# Patient Record
Sex: Female | Born: 1935 | Race: White | Hispanic: No | Smoking: Never smoker
Health system: Southern US, Community
[De-identification: ages and names within clinical notes are randomized; demographics above are authoritative.]

## PROBLEM LIST (undated history)

## (undated) DIAGNOSIS — E119 Type 2 diabetes mellitus without complications: Secondary | ICD-10-CM

## (undated) DIAGNOSIS — N183 Chronic kidney disease, stage 3 unspecified: Secondary | ICD-10-CM

## (undated) DIAGNOSIS — I10 Essential (primary) hypertension: Secondary | ICD-10-CM

## (undated) DIAGNOSIS — I4891 Unspecified atrial fibrillation: Secondary | ICD-10-CM

## (undated) DIAGNOSIS — E079 Disorder of thyroid, unspecified: Secondary | ICD-10-CM

## (undated) HISTORY — DX: Disorder of thyroid, unspecified: E07.9

## (undated) HISTORY — PX: CARDIAC CATHETERIZATION: SHX172

## (undated) HISTORY — DX: Chronic kidney disease, stage 3 unspecified: N18.30

## (undated) HISTORY — DX: Chronic kidney disease, stage 3 (moderate): N18.3

## (undated) HISTORY — PX: CORONARY ARTERY BYPASS GRAFT: SHX141

---

## 2016-04-23 ENCOUNTER — Emergency Department
Admission: EM | Admit: 2016-04-23 | Discharge: 2016-04-23 | Disposition: A | Discharge status: Home / Self Care | Payer: Medicare Other | Attending: Emergency Medicine | Admitting: Emergency Medicine

## 2016-04-23 ENCOUNTER — Emergency Department: Payer: Medicare Other

## 2016-04-23 ENCOUNTER — Encounter: Payer: Self-pay | Admitting: *Deleted

## 2016-04-23 DIAGNOSIS — I10 Essential (primary) hypertension: Secondary | ICD-10-CM | POA: Insufficient documentation

## 2016-04-23 DIAGNOSIS — W1800XA Striking against unspecified object with subsequent fall, initial encounter: Secondary | ICD-10-CM | POA: Diagnosis not present

## 2016-04-23 DIAGNOSIS — W19XXXA Unspecified fall, initial encounter: Secondary | ICD-10-CM

## 2016-04-23 DIAGNOSIS — N3000 Acute cystitis without hematuria: Secondary | ICD-10-CM | POA: Insufficient documentation

## 2016-04-23 DIAGNOSIS — E1165 Type 2 diabetes mellitus with hyperglycemia: Secondary | ICD-10-CM | POA: Insufficient documentation

## 2016-04-23 DIAGNOSIS — Z951 Presence of aortocoronary bypass graft: Secondary | ICD-10-CM | POA: Diagnosis not present

## 2016-04-23 DIAGNOSIS — Y939 Activity, unspecified: Secondary | ICD-10-CM | POA: Insufficient documentation

## 2016-04-23 DIAGNOSIS — Y999 Unspecified external cause status: Secondary | ICD-10-CM | POA: Insufficient documentation

## 2016-04-23 DIAGNOSIS — S0990XA Unspecified injury of head, initial encounter: Secondary | ICD-10-CM | POA: Diagnosis present

## 2016-04-23 DIAGNOSIS — Z79899 Other long term (current) drug therapy: Secondary | ICD-10-CM | POA: Insufficient documentation

## 2016-04-23 DIAGNOSIS — Z794 Long term (current) use of insulin: Secondary | ICD-10-CM | POA: Insufficient documentation

## 2016-04-23 DIAGNOSIS — Z7901 Long term (current) use of anticoagulants: Secondary | ICD-10-CM | POA: Insufficient documentation

## 2016-04-23 DIAGNOSIS — Z7982 Long term (current) use of aspirin: Secondary | ICD-10-CM | POA: Diagnosis not present

## 2016-04-23 DIAGNOSIS — Y929 Unspecified place or not applicable: Secondary | ICD-10-CM | POA: Insufficient documentation

## 2016-04-23 DIAGNOSIS — R739 Hyperglycemia, unspecified: Secondary | ICD-10-CM

## 2016-04-23 HISTORY — DX: Unspecified atrial fibrillation: I48.91

## 2016-04-23 HISTORY — DX: Type 2 diabetes mellitus without complications: E11.9

## 2016-04-23 HISTORY — DX: Essential (primary) hypertension: I10

## 2016-04-23 LAB — BLOOD GAS, VENOUS
ACID-BASE EXCESS: 7.6 mmol/L — AB (ref 0.0–2.0)
Bicarbonate: 33.7 mmol/L — ABNORMAL HIGH (ref 20.0–28.0)
O2 SAT: 66.9 %
PH VEN: 7.42 (ref 7.250–7.430)
Patient temperature: 37
pCO2, Ven: 52 mmHg (ref 44.0–60.0)
pO2, Ven: 34 mmHg (ref 32.0–45.0)

## 2016-04-23 LAB — BASIC METABOLIC PANEL
ANION GAP: 9 (ref 5–15)
BUN: 51 mg/dL — ABNORMAL HIGH (ref 6–20)
CO2: 30 mmol/L (ref 22–32)
Calcium: 9.1 mg/dL (ref 8.9–10.3)
Chloride: 89 mmol/L — ABNORMAL LOW (ref 101–111)
Creatinine, Ser: 1.83 mg/dL — ABNORMAL HIGH (ref 0.44–1.00)
GFR, EST AFRICAN AMERICAN: 27 mL/min — AB (ref >60)
GFR, EST NON AFRICAN AMERICAN: 24 mL/min — AB (ref >60)
Glucose, Bld: 506 mg/dL (ref 65–99)
POTASSIUM: 4.5 mmol/L (ref 3.5–5.1)
SODIUM: 128 mmol/L — AB (ref 135–145)

## 2016-04-23 LAB — URINALYSIS COMPLETE WITH MICROSCOPIC (ARMC ONLY)
Bilirubin Urine: NEGATIVE
HGB URINE DIPSTICK: NEGATIVE
Ketones, ur: NEGATIVE mg/dL
NITRITE: NEGATIVE
Protein, ur: NEGATIVE mg/dL
SPECIFIC GRAVITY, URINE: 1.006 (ref 1.005–1.030)
Trans Epithel, UA: <1
pH: 7 (ref 5.0–8.0)

## 2016-04-23 LAB — CBC
HEMATOCRIT: 39.9 % (ref 35.0–47.0)
HEMOGLOBIN: 13.2 g/dL (ref 12.0–16.0)
MCH: 30.6 pg (ref 26.0–34.0)
MCHC: 33.2 g/dL (ref 32.0–36.0)
MCV: 92.2 fL (ref 80.0–100.0)
Platelets: 155 10*3/uL (ref 150–440)
RBC: 4.32 MIL/uL (ref 3.80–5.20)
RDW: 15 % — AB (ref 11.5–14.5)
WBC: 10.8 10*3/uL (ref 3.6–11.0)

## 2016-04-23 LAB — GLUCOSE, CAPILLARY
GLUCOSE-CAPILLARY: 541 mg/dL — AB (ref 65–99)
GLUCOSE-CAPILLARY: 361 mg/dL — AB (ref 65–99)
Glucose-Capillary: 313 mg/dL — ABNORMAL HIGH (ref 65–99)
Glucose-Capillary: 333 mg/dL — ABNORMAL HIGH (ref 65–99)

## 2016-04-23 LAB — PROTIME-INR
INR: 2.46
Prothrombin Time: 27.1 seconds — ABNORMAL HIGH (ref 11.4–15.2)

## 2016-04-23 MED ORDER — INSULIN ASPART 100 UNIT/ML ~~LOC~~ SOLN
8.0000 [IU] | Freq: Once | SUBCUTANEOUS | Status: DC
  Filled 2016-04-23: qty 8

## 2016-04-23 MED ORDER — CEFTRIAXONE SODIUM-DEXTROSE 1-3.74 GM-% IV SOLR
INTRAVENOUS | Status: AC
  Filled 2016-04-23: qty 50

## 2016-04-23 MED ORDER — SODIUM CHLORIDE 0.9 % IV BOLUS (SEPSIS)
1000.0000 mL | Freq: Once | INTRAVENOUS | Status: AC
  Administered 2016-04-23: 1000 mL via INTRAVENOUS

## 2016-04-23 MED ORDER — CEPHALEXIN 500 MG PO CAPS: 500.0000 mg | ORAL_CAPSULE | Freq: Three times a day (TID) | ORAL | 0 refills | Status: DC

## 2016-04-23 MED ORDER — DEXTROSE 5 % IV SOLN
1.0000 g | Freq: Once | INTRAVENOUS | Status: AC
  Administered 2016-04-23: 1 g via INTRAVENOUS
  Filled 2016-04-23: qty 10

## 2016-04-23 MED ORDER — INSULIN ASPART 100 UNIT/ML ~~LOC~~ SOLN
4.0000 [IU] | Freq: Once | SUBCUTANEOUS | Status: AC
  Administered 2016-04-23: 4 [IU] via INTRAVENOUS

## 2016-04-23 MED ORDER — CEPHALEXIN 250 MG PO CAPS: 250.0000 mg | ORAL_CAPSULE | Freq: Two times a day (BID) | ORAL | 0 refills | Status: AC

## 2016-04-23 NOTE — ED Notes (Signed)
Pt to CT

## 2016-04-23 NOTE — ED Notes (Signed)
Reported patients CBG to MD Paduchowski. MD ordered to decrease insulin dose to 4 units.

## 2016-04-23 NOTE — ED Notes (Signed)
Dr. Lenard LancePaduchowski notified of blood sugar.

## 2016-04-23 NOTE — ED Provider Notes (Signed)
[facility] Emergency Department Provider Note  Time seen: 12:49 PM  I have reviewed the triage vital signs and the nursing notes.   HISTORY  Chief Complaint Hyperglycemia    HPI [name] [name] is a 82 y.o. female with a past medical history of diabetes, hypertension, atrial fibrillation who presents to the emergency department for elevated blood sugars. According to the daughter for the past 1 week of patient's blood sugars have been running high in the 300s. Today they were too high to read so she brought her to the emergency department for evaluation. Patient denies any abdominal pain, chest pain, nausea, vomiting. States she was having diarrhea earlier this week but it has resolved. Denies dysuria. Patient did have a fall today hitting her head. Patient does take Coumadin at baseline for atrial fibrillation.  Past Medical History:  Diagnosis Date  . A-fib (HCC)   . Diabetes mellitus without complication (HCC)   . Hypertension     There are no active problems to display for this patient.   Past Surgical History:  Procedure Laterality Date  . CORONARY ARTERY BYPASS GRAFT      Prior to Admission medications   Not on File    No Known Allergies  History reviewed. No pertinent family history.  Social History Social History  Substance Use Topics  . Smoking status: Never Smoker  . Smokeless tobacco: Never Used  . Alcohol use No    Review of Systems Constitutional: Negative for fever Cardiovascular: Negative for chest pain. Respiratory: Negative for shortness of breath. Gastrointestinal: Negative for abdominal pain, vomiting  Genitourinary: Negative for dysuria Neurological: Negative for headache 10-point ROS otherwise negative.  ____________________________________________   PHYSICAL EXAM:  VITAL SIGNS: ED Triage Vitals  Enc Vitals Group     BP 04/23/16 1207 122/84     Pulse Rate 04/23/16 1207 76     Resp 04/23/16 1207 16   Temp 04/23/16 1207 98.3 F (36.8 C)     Temp Source 04/23/16 1207 Oral     SpO2 04/23/16 1207 96 %     Weight 04/23/16 1208 180 lb (81.6 kg)     Height 04/23/16 1208 5\' 3"  (1.6 m)     Head Circumference --      Peak Flow --      Pain Score --      Pain Loc --      Pain Edu? --      Excl. in GC? --     Constitutional: Alert and oriented. Well appearing and in no distress. Eyes: Normal exam ENT   Head: Normocephalic and atraumatic.   Mouth/Throat: Mucous membranes are moist. Cardiovascular: Normal rate, regular rhythm. Respiratory: Normal respiratory effort without tachypnea nor retractions. Breath sounds are clear  Gastrointestinal: Soft and nontender. No distention.   Musculoskeletal: Nontender with normal range of motion in all extremities. Neurologic:  Normal speech and language. No gross focal neurologic deficits Skin:  Skin is warm, dry and intact.  Psychiatric: Tearful at times. Daughter states the patient has been very emotional since [place] one month ago.  ____________________________________________     RADIOLOGY  CT head negative  ____________________________________________   INITIAL IMPRESSION / ASSESSMENT AND PLAN / ED COURSE  Pertinent labs & imaging results that were available during my care of the patient were reviewed by me and considered in my medical decision making (see chart for details).  The patient presents the emergency department with an elevated blood sugar. Fingerstick is 541.  We will check labs, IV hydrate and closely monitor in the emergency department. Overall the patient appears well, no distress, overall normal physical examination.  CT head is negative. Patient's labs consistent with urinary tract infection. Patient given 1 g of Rocephin in the emergency department. This is likely contributed to the patient's hyperglycemia. It is now down to 300s unless fingerstick. We will discharge the patient home with PCP  follow-up in 10 days of Keflex. Patient agreeable to plan.  ____________________________________________   FINAL CLINICAL IMPRESSION(S) / ED DIAGNOSES  Hyperglycemia    Minna AntisKevin Verdie Barrows, MD 04/23/16 1751

## 2016-04-23 NOTE — Discharge Instructions (Signed)
Please drink plenty of non-sugary fluids over the next several days. Please take your antibiotics as prescribed for their entire course for urinary tract infection. Please follow-up with your primary care doctor in the next several days for recheck of your kidney function as well as your Coumadin level. Return to the emergency department for any significantly elevated blood glucose levels, or any other symptom personally concerning to your self.

## 2016-04-23 NOTE — ED Notes (Addendum)
Water given. Pt up to the bathroom with minimal assistance. Did not obtain urine specimen as family member helped patient up to the bathroom and did not have a cup or hat to catch it in

## 2016-04-23 NOTE — ED Notes (Signed)
Reviewed d/c instructions, follow-up care and prescription with pt. Pt verbalized understanding 

## 2016-04-23 NOTE — ED Notes (Signed)
Pt c/o leg cramps. MD Paduchowski informed

## 2016-04-23 NOTE — ED Notes (Signed)
MD Paduchowski informed of pt's CBG

## 2016-04-23 NOTE — ED Notes (Signed)
RT notified of VBG in room.

## 2016-04-23 NOTE — ED Notes (Signed)
MD Paduchowski informed of patient's CBG of 333

## 2016-04-23 NOTE — ED Notes (Signed)
PT returned from CT

## 2016-04-23 NOTE — ED Triage Notes (Signed)
Daughter states CBG have been in the 300s for about a week, states she recently moved her and thought it was from the stress but CBG read "high" this AM, pt takes 30 units of lantus BID and glipizde, states she has not missed a dose, states she has felt very tired recently, denies any recent sickness, pt awake and alert in no acute distress, pt had a fall this AM and hit her head,pt on coumadin for hx of afib

## 2016-04-24 LAB — URINE CULTURE

## 2016-05-04 ENCOUNTER — Emergency Department: Payer: Medicare Other

## 2016-05-04 ENCOUNTER — Inpatient Hospital Stay
Admission: EM | Admit: 2016-05-04 | Discharge: 2016-05-06 | DRG: 637 | Disposition: A | Discharge status: Home / Self Care | Payer: Medicare Other | Attending: Internal Medicine | Admitting: Internal Medicine

## 2016-05-04 ENCOUNTER — Encounter: Payer: Self-pay | Admitting: Emergency Medicine

## 2016-05-04 DIAGNOSIS — Z79899 Other long term (current) drug therapy: Secondary | ICD-10-CM | POA: Diagnosis not present

## 2016-05-04 DIAGNOSIS — I129 Hypertensive chronic kidney disease with stage 1 through stage 4 chronic kidney disease, or unspecified chronic kidney disease: Secondary | ICD-10-CM | POA: Diagnosis present

## 2016-05-04 DIAGNOSIS — I482 Chronic atrial fibrillation: Secondary | ICD-10-CM | POA: Diagnosis present

## 2016-05-04 DIAGNOSIS — N183 Chronic kidney disease, stage 3 (moderate): Secondary | ICD-10-CM | POA: Diagnosis present

## 2016-05-04 DIAGNOSIS — Z7982 Long term (current) use of aspirin: Secondary | ICD-10-CM

## 2016-05-04 DIAGNOSIS — G9340 Encephalopathy, unspecified: Secondary | ICD-10-CM | POA: Diagnosis present

## 2016-05-04 DIAGNOSIS — Z794 Long term (current) use of insulin: Secondary | ICD-10-CM

## 2016-05-04 DIAGNOSIS — Z951 Presence of aortocoronary bypass graft: Secondary | ICD-10-CM | POA: Diagnosis not present

## 2016-05-04 DIAGNOSIS — E871 Hypo-osmolality and hyponatremia: Secondary | ICD-10-CM | POA: Diagnosis present

## 2016-05-04 DIAGNOSIS — IMO0002 Reserved for concepts with insufficient information to code with codable children: Secondary | ICD-10-CM | POA: Diagnosis present

## 2016-05-04 DIAGNOSIS — Z66 Do not resuscitate: Secondary | ICD-10-CM | POA: Diagnosis present

## 2016-05-04 DIAGNOSIS — E8770 Fluid overload, unspecified: Secondary | ICD-10-CM | POA: Diagnosis present

## 2016-05-04 DIAGNOSIS — N179 Acute kidney failure, unspecified: Secondary | ICD-10-CM | POA: Diagnosis present

## 2016-05-04 DIAGNOSIS — R2681 Unsteadiness on feet: Secondary | ICD-10-CM

## 2016-05-04 DIAGNOSIS — E860 Dehydration: Secondary | ICD-10-CM | POA: Diagnosis present

## 2016-05-04 DIAGNOSIS — W19XXXA Unspecified fall, initial encounter: Secondary | ICD-10-CM | POA: Diagnosis present

## 2016-05-04 DIAGNOSIS — Z7901 Long term (current) use of anticoagulants: Secondary | ICD-10-CM | POA: Diagnosis not present

## 2016-05-04 DIAGNOSIS — Z6831 Body mass index (BMI) 31.0-31.9, adult: Secondary | ICD-10-CM

## 2016-05-04 DIAGNOSIS — E1165 Type 2 diabetes mellitus with hyperglycemia: Secondary | ICD-10-CM | POA: Diagnosis present

## 2016-05-04 DIAGNOSIS — E1122 Type 2 diabetes mellitus with diabetic chronic kidney disease: Secondary | ICD-10-CM | POA: Diagnosis present

## 2016-05-04 LAB — CBC
HEMATOCRIT: 35.1 % (ref 35.0–47.0)
HEMATOCRIT: 34.8 % — AB (ref 35.0–47.0)
Hemoglobin: 11.8 g/dL — ABNORMAL LOW (ref 12.0–16.0)
Hemoglobin: 11.8 g/dL — ABNORMAL LOW (ref 12.0–16.0)
MCH: 31 pg (ref 26.0–34.0)
MCH: 31.1 pg (ref 26.0–34.0)
MCHC: 33.8 g/dL (ref 32.0–36.0)
MCHC: 34 g/dL (ref 32.0–36.0)
MCV: 91.9 fL (ref 80.0–100.0)
MCV: 91.6 fL (ref 80.0–100.0)
PLATELETS: 188 10*3/uL (ref 150–440)
Platelets: 166 10*3/uL (ref 150–440)
RBC: 3.82 MIL/uL (ref 3.80–5.20)
RBC: 3.8 MIL/uL (ref 3.80–5.20)
RDW: 16.1 % — AB (ref 11.5–14.5)
RDW: 15.8 % — AB (ref 11.5–14.5)
WBC: 8.1 10*3/uL (ref 3.6–11.0)
WBC: 7.6 10*3/uL (ref 3.6–11.0)

## 2016-05-04 LAB — URINALYSIS COMPLETE WITH MICROSCOPIC (ARMC ONLY)
BACTERIA UA: NONE SEEN
BILIRUBIN URINE: NEGATIVE
Glucose, UA: >500 mg/dL — AB
HGB URINE DIPSTICK: NEGATIVE
Ketones, ur: NEGATIVE mg/dL
LEUKOCYTES UA: NEGATIVE
Nitrite: NEGATIVE
PH: 5 (ref 5.0–8.0)
Protein, ur: NEGATIVE mg/dL
RBC / HPF: NONE SEEN RBC/hpf (ref 0–5)
Specific Gravity, Urine: 1.003 — ABNORMAL LOW (ref 1.005–1.030)

## 2016-05-04 LAB — BRAIN NATRIURETIC PEPTIDE: B Natriuretic Peptide: 286 pg/mL — ABNORMAL HIGH (ref 0.0–100.0)

## 2016-05-04 LAB — BASIC METABOLIC PANEL
Anion gap: 11 (ref 5–15)
BUN: 53 mg/dL — AB (ref 6–20)
CHLORIDE: 85 mmol/L — AB (ref 101–111)
CO2: 26 mmol/L (ref 22–32)
CREATININE: 2.02 mg/dL — AB (ref 0.44–1.00)
Calcium: 8.6 mg/dL — ABNORMAL LOW (ref 8.9–10.3)
GFR calc Af Amer: 24 mL/min — ABNORMAL LOW (ref >60)
GFR calc non Af Amer: 21 mL/min — ABNORMAL LOW (ref >60)
Glucose, Bld: 423 mg/dL — ABNORMAL HIGH (ref 65–99)
POTASSIUM: 4.4 mmol/L (ref 3.5–5.1)
Sodium: 122 mmol/L — ABNORMAL LOW (ref 135–145)

## 2016-05-04 LAB — GLUCOSE, CAPILLARY
GLUCOSE-CAPILLARY: 325 mg/dL — AB (ref 65–99)
GLUCOSE-CAPILLARY: 238 mg/dL — AB (ref 65–99)
Glucose-Capillary: 263 mg/dL — ABNORMAL HIGH (ref 65–99)

## 2016-05-04 LAB — HEPATIC FUNCTION PANEL
ALK PHOS: 114 U/L (ref 38–126)
ALT: 22 U/L (ref 14–54)
AST: 31 U/L (ref 15–41)
Albumin: 3.3 g/dL — ABNORMAL LOW (ref 3.5–5.0)
BILIRUBIN DIRECT: 0.2 mg/dL (ref 0.1–0.5)
BILIRUBIN INDIRECT: 0.7 mg/dL (ref 0.3–0.9)
BILIRUBIN TOTAL: 0.9 mg/dL (ref 0.3–1.2)
TOTAL PROTEIN: 7.2 g/dL (ref 6.5–8.1)

## 2016-05-04 LAB — PROTIME-INR
INR: 3.16
PROTHROMBIN TIME: 33.1 s — AB (ref 11.4–15.2)

## 2016-05-04 LAB — MRSA PCR SCREENING: MRSA BY PCR: NEGATIVE

## 2016-05-04 LAB — LACTIC ACID, PLASMA: LACTIC ACID, VENOUS: 1.8 mmol/L (ref 0.5–1.9)

## 2016-05-04 LAB — TROPONIN I: Troponin I: 0.03 ng/mL (ref <0.03)

## 2016-05-04 MED ORDER — OCUVITE-LUTEIN PO CAPS
1.0000 | ORAL_CAPSULE | Freq: Every day | ORAL | Status: DC
  Administered 2016-05-04 – 2016-05-06 (×3): 1 via ORAL
  Filled 2016-05-04 (×3): qty 1

## 2016-05-04 MED ORDER — HEPARIN SODIUM (PORCINE) 5000 UNIT/ML IJ SOLN
5000.0000 [IU] | Freq: Three times a day (TID) | INTRAMUSCULAR | Status: DC
Start: 2016-05-04 — End: 2016-05-04

## 2016-05-04 MED ORDER — TRAMADOL HCL 50 MG PO TABS: 50.0000 mg | ORAL_TABLET | Freq: Four times a day (QID) | ORAL | Status: DC | PRN

## 2016-05-04 MED ORDER — GLIPIZIDE 10 MG PO TABS: 5.0000 mg | ORAL_TABLET | Freq: Every day | ORAL | Status: DC

## 2016-05-04 MED ORDER — SODIUM CHLORIDE 0.9 % IV SOLN
Freq: Once | INTRAVENOUS | Status: AC
  Administered 2016-05-04: 18:00:00 via INTRAVENOUS

## 2016-05-04 MED ORDER — ONDANSETRON HCL 4 MG PO TABS: 4.0000 mg | ORAL_TABLET | Freq: Four times a day (QID) | ORAL | Status: DC | PRN

## 2016-05-04 MED ORDER — ONDANSETRON HCL 4 MG/2ML IJ SOLN: 4.0000 mg | Freq: Four times a day (QID) | INTRAMUSCULAR | Status: DC | PRN

## 2016-05-04 MED ORDER — ACETAMINOPHEN 650 MG RE SUPP: 650.0000 mg | Freq: Four times a day (QID) | RECTAL | Status: DC | PRN

## 2016-05-04 MED ORDER — ASPIRIN EC 81 MG PO TBEC
81.0000 mg | DELAYED_RELEASE_TABLET | Freq: Every day | ORAL | Status: DC
  Administered 2016-05-05 – 2016-05-06 (×2): 81 mg via ORAL
  Filled 2016-05-04 (×2): qty 1

## 2016-05-04 MED ORDER — BUMETANIDE 1 MG PO TABS
1.0000 mg | ORAL_TABLET | Freq: Every day | ORAL | Status: DC
  Filled 2016-05-04: qty 1

## 2016-05-04 MED ORDER — WARFARIN SODIUM 2.5 MG PO TABS: 2.5000 mg | ORAL_TABLET | Freq: Every day | ORAL | Status: DC

## 2016-05-04 MED ORDER — INSULIN ASPART 100 UNIT/ML ~~LOC~~ SOLN: 8.0000 [IU] | Freq: Three times a day (TID) | SUBCUTANEOUS | Status: DC

## 2016-05-04 MED ORDER — ADULT MULTIVITAMIN W/MINERALS CH
1.0000 | ORAL_TABLET | Freq: Every day | ORAL | Status: DC
  Administered 2016-05-04 – 2016-05-06 (×3): 1 via ORAL
  Filled 2016-05-04 (×3): qty 1

## 2016-05-04 MED ORDER — ACETAMINOPHEN 325 MG PO TABS: 650.0000 mg | ORAL_TABLET | Freq: Four times a day (QID) | ORAL | Status: DC | PRN

## 2016-05-04 MED ORDER — INSULIN ASPART 100 UNIT/ML ~~LOC~~ SOLN
5.0000 [IU] | Freq: Once | SUBCUTANEOUS | Status: AC
  Administered 2016-05-04: 5 [IU] via SUBCUTANEOUS
  Filled 2016-05-04: qty 5

## 2016-05-04 MED ORDER — AMLODIPINE BESYLATE 5 MG PO TABS
5.0000 mg | ORAL_TABLET | Freq: Every day | ORAL | Status: DC
  Administered 2016-05-05: 5 mg via ORAL
  Filled 2016-05-04: qty 1

## 2016-05-04 MED ORDER — POTASSIUM CHLORIDE CRYS ER 20 MEQ PO TBCR
200.0000 meq | EXTENDED_RELEASE_TABLET | ORAL | Status: DC
  Filled 2016-05-04: qty 10

## 2016-05-04 MED ORDER — SODIUM CHLORIDE 0.9 % IV BOLUS (SEPSIS): 1000.0000 mL | Freq: Once | INTRAVENOUS | Status: DC

## 2016-05-04 MED ORDER — VITAMIN B-12 1000 MCG PO TABS
2500.0000 ug | ORAL_TABLET | Freq: Every day | ORAL | Status: DC
  Administered 2016-05-05 – 2016-05-06 (×2): 2500 ug via ORAL
  Filled 2016-05-04 (×2): qty 3

## 2016-05-04 MED ORDER — SODIUM CHLORIDE 0.9 % IV BOLUS (SEPSIS)
500.0000 mL | Freq: Once | INTRAVENOUS | Status: AC
  Administered 2016-05-04: 500 mL via INTRAVENOUS

## 2016-05-04 MED ORDER — INSULIN GLARGINE 100 UNIT/ML ~~LOC~~ SOLN
30.0000 [IU] | Freq: Two times a day (BID) | SUBCUTANEOUS | Status: DC
  Administered 2016-05-04: 30 [IU] via SUBCUTANEOUS
  Filled 2016-05-04 (×3): qty 0.3

## 2016-05-04 MED ORDER — SODIUM CHLORIDE 0.9 % IV SOLN
Freq: Once | INTRAVENOUS | Status: AC
  Administered 2016-05-04: 22:00:00 via INTRAVENOUS

## 2016-05-04 MED ORDER — POTASSIUM CHLORIDE CRYS ER 20 MEQ PO TBCR
20.0000 meq | EXTENDED_RELEASE_TABLET | ORAL | Status: DC
  Administered 2016-05-04 – 2016-05-06 (×2): 20 meq via ORAL
  Filled 2016-05-04 (×2): qty 1

## 2016-05-04 NOTE — ED Notes (Signed)
Ambulate pt and O2 stats were at 90%.

## 2016-05-04 NOTE — Progress Notes (Addendum)
ANTICOAGULATION CONSULT NOTE - Initial Consult  Pharmacy Consult for Warfarin  Indication: atrial fibrillation  Allergies  Allergen Reactions  . Pineapple Hives    Patient Measurements: Height: 5' 2.5" (158.8 cm) Weight: 187 lb 14.4 oz (85.2 kg) IBW/kg (Calculated) : 51.25 Heparin Dosing Weight:   Vital Signs: Temp: 98.5 F (36.9 C) (11/14 2057) Temp Source: Oral (11/14 2057) BP: 147/57 (11/14 2057) Pulse Rate: 119 (11/14 2057)  Labs:  Recent Labs  05/04/16 1310 05/04/16 1534  HGB 11.8*  --   HCT 35.1  --   PLT 188  --   LABPROT  --  33.1*  INR  --  3.16  CREATININE 2.02*  --   TROPONINI  --  0.03*    Estimated Creatinine Clearance: 19.7 mL/min (by C-G formula based on SCr of 2.02 mg/dL (H)).   Medical History: Past Medical History:  Diagnosis Date  . A-fib (HCC)   . Diabetes mellitus without complication (HCC)   . Hypertension     Medications:  Prescriptions Prior to Admission  Medication Sig Dispense Refill Last Dose  . amLODipine (NORVASC) 5 MG tablet Take 5 mg by mouth daily.   05/04/2016 at 0900  . aspirin 81 MG EC tablet Take 1 tablet by mouth daily.  5 05/04/2016 at 0900  . bumetanide (BUMEX) 1 MG tablet Take 1 tablet by mouth daily.   1 05/04/2016 at 0900  . Cyanocobalamin 2500 MCG CHEW Take 1 tablet by mouth daily.   05/04/2016 at 0900  . glipiZIDE (GLUCOTROL) 5 MG tablet Take 5 mg by mouth daily.  3 05/04/2016 at 0900  . LANTUS SOLOSTAR 100 UNIT/ML Solostar Pen Inject 30 Units into the skin 2 (two) times daily.  0 05/04/2016 at 0900  . losartan-hydrochlorothiazide (HYZAAR) 100-12.5 MG tablet Take 1 tablet by mouth daily.  5 05/04/2016 at 0900  . metoprolol (LOPRESSOR) 50 MG tablet Take 1 tablet by mouth 2 (two) times daily.  5 05/04/2016 at 0900  . Multiple Vitamins-Minerals (OCUVITE EYE + MULTI) TABS Take 1 tablet by mouth daily.   05/03/2016 at pm  . multivitamin (ONE-A-DAY MEN'S) TABS tablet Take 1 tablet by mouth daily.   05/03/2016 at pm  .  potassium chloride SA (K-DUR,KLOR-CON) 20 MEQ tablet Take 10 tablets by mouth every other day.    05/04/2016 at 0900  . warfarin (COUMADIN) 2.5 MG tablet Take 2.5 mg by mouth daily.  0 05/04/2016 at 0900  . cephALEXin (KEFLEX) 500 MG capsule Take 1 capsule (500 mg total) by mouth 3 (three) times daily. (Patient not taking: Reported on 05/04/2016) 30 capsule 0 Completed Course at Unknown time    Assessment: Pharmacy consulted to dose warfarin in this 82 year old female admitted with Afib.  Pt was on warfarin 2.5 mg PO daily at home.  11/14 :  INR = 3.16 11/15 AM INR 3.29.  Goal of Therapy:  INR 2-3   Plan:  Will hold warfarin dose and recheck INR on 11/15 with AM labs.   11/15 AM: Hold dose today and recheck INR with 11/16 AM labs.   Robbins,Jason D 05/04/2016,9:34 PM

## 2016-05-04 NOTE — ED Provider Notes (Signed)
[facility]-EMERGENCY DEPARTMENT Provider Note   CSN: [id number] Arrival date & time: 05/04/16  1240     History   Chief Complaint Chief Complaint  Patient presents with  . Hyperglycemia    HPI [name] [name] is a 82 y.o. female hx of afib on coumadin, Diabetes, hypertension here presenting with persistently elevated blood sugars, confusion. She was seen in the ED about a week ago and was diagnosed with hyperglycemia and UTI. Patient was given some IV fluids and insulin and ceftriaxone and discharged home with Keflex. She just finished her Keflex yesterday. Daughter has been checking on her daily. She noticed that she has been confused for the last several days. She sometimes gets very disoriented and didn't know where she has. Also last several days her blood sugar has been elevated around 400-500. Denies any vomiting. Has been having worsening leg swelling and has some productive cough but no fevers.   The history is provided by the patient and a relative.    Past Medical History:  Diagnosis Date  . A-fib (HCC)   . Diabetes mellitus without complication (HCC)   . Hypertension     There are no active problems to display for this patient.   Past Surgical History:  Procedure Laterality Date  . CORONARY ARTERY BYPASS GRAFT      OB History    No data available       Home Medications    Prior to Admission medications   Medication Sig Start Date End Date Taking? Authorizing Provider  amLODipine (NORVASC) 5 MG tablet Take 5 mg by mouth daily.    Historical Provider, MD  aspirin 81 MG EC tablet Take 1 tablet by mouth daily. 04/05/16   Historical Provider, MD  bumetanide (BUMEX) 1 MG tablet Take 1 tablet by mouth daily.  04/02/16   Historical Provider, MD  cephALEXin (KEFLEX) 500 MG capsule Take 1 capsule (500 mg total) by mouth 3 (three) times daily. 04/23/16   Minna AntisKevin Paduchowski, MD  Cyanocobalamin 2500 MCG CHEW Take 1 tablet by mouth daily.    Historical Provider, MD    glipiZIDE (GLUCOTROL) 5 MG tablet Take 5 mg by mouth daily. 03/22/16   Historical Provider, MD  LANTUS SOLOSTAR 100 UNIT/ML Solostar Pen Inject 30 Units into the skin 2 (two) times daily. 03/15/16   Historical Provider, MD  losartan-hydrochlorothiazide (HYZAAR) 100-12.5 MG tablet Take 1 tablet by mouth daily. 04/05/16   Historical Provider, MD  metoprolol (LOPRESSOR) 50 MG tablet Take 1 tablet by mouth 2 (two) times daily. 04/05/16   Historical Provider, MD  Multiple Vitamins-Minerals (OCUVITE EYE + MULTI) TABS Take 1 tablet by mouth daily.    Historical Provider, MD  multivitamin (ONE-A-DAY MEN'S) TABS tablet Take 1 tablet by mouth daily.    Historical Provider, MD  potassium chloride SA (K-DUR,KLOR-CON) 20 MEQ tablet Take 10 tablets by mouth every other day.     Historical Provider, MD  warfarin (COUMADIN) 2.5 MG tablet Take 2.5 mg by mouth daily. 04/02/16   Historical Provider, MD    Family History History reviewed. No pertinent family history.  Social History Social History  Substance Use Topics  . Smoking status: Never Smoker  . Smokeless tobacco: Never Used  . Alcohol use No     Allergies   Patient has no known allergies.   Review of Systems Review of Systems  Neurological: Positive for dizziness.  Psychiatric/Behavioral: Positive for confusion.  All other systems reviewed and are negative.    Physical Exam Updated Vital  Signs BP 114/66 (BP Location: Right Arm)   Pulse (!) 55   Temp 98.2 F (36.8 C) (Oral)   Resp 20   Ht 5\' 3"  (1.6 m)   Wt 180 lb (81.6 kg)   SpO2 92%   BMI 31.89 kg/m   Physical Exam  Constitutional:  Chronically ill appearing   HENT:  Head: Normocephalic.  MM dry   Eyes: EOM are normal. Pupils are equal, round, and reactive to light.  Neck: Normal range of motion. Neck supple.  Cardiovascular: Normal rate.   Systolic murmur loudest at LUSB   Pulmonary/Chest:  Slightly tachypneic, bibasilar crackles   Abdominal: Soft. Bowel sounds are  normal. She exhibits no distension. There is no tenderness. There is no guarding.  Musculoskeletal: Normal range of motion.  1 + edema bilateral legs   Neurological: She is alert.  Confused. CN 2-12 intact. Nl strength throughout   Skin: Skin is warm.  Psychiatric: She has a normal mood and affect.  Nursing note and vitals reviewed.    ED Treatments / Results  Labs (all labs ordered are listed, but only abnormal results are displayed) Labs Reviewed  BASIC METABOLIC PANEL - Abnormal; Notable for the following:       Result Value   Sodium 122 (*)    Chloride 85 (*)    Glucose, Bld 423 (*)    BUN 53 (*)    Creatinine, Ser 2.02 (*)    Calcium 8.6 (*)    GFR calc non Af Amer 21 (*)    GFR calc Af Amer 24 (*)    All other components within normal limits  CBC - Abnormal; Notable for the following:    Hemoglobin 11.8 (*)    RDW 16.1 (*)    All other components within normal limits  URINALYSIS COMPLETEWITH MICROSCOPIC ([facility] ONLY) - Abnormal; Notable for the following:    Color, Urine STRAW (*)    APPearance CLEAR (*)    Glucose, UA >500 (*)    Specific Gravity, Urine 1.003 (*)    Squamous Epithelial / LPF 0-5 (*)    All other components within normal limits  PROTIME-INR - Abnormal; Notable for the following:    Prothrombin Time 33.1 (*)    All other components within normal limits  BRAIN NATRIURETIC PEPTIDE - Abnormal; Notable for the following:    B Natriuretic Peptide 286.0 (*)    All other components within normal limits  HEPATIC FUNCTION PANEL - Abnormal; Notable for the following:    Albumin 3.3 (*)    All other components within normal limits  CULTURE, BLOOD (ROUTINE X 2)  CULTURE, BLOOD (ROUTINE X 2)  URINE CULTURE  LACTIC ACID, PLASMA  TROPONIN I  CBG MONITORING, ED    EKG  EKG Interpretation None        Radiology No results found.  Procedures Procedures (including critical care time)  CRITICAL CARE Performed by: Richardean Canal   Total critical  care time: 30 minutes  Critical care time was exclusive of separately billable procedures and treating other patients.  Critical care was necessary to treat or prevent imminent or life-threatening deterioration.  Critical care was time spent personally by me on the following activities: development of treatment plan with patient and/or surrogate as well as nursing, discussions with consultants, evaluation of patient's response to treatment, examination of patient, obtaining history from patient or surrogate, ordering and performing treatments and interventions, ordering and review of laboratory studies, ordering and review of radiographic studies,  pulse oximetry and re-evaluation of patient's condition.   Medications Ordered in ED Medications  sodium chloride 0.9 % bolus 500 mL (0 mLs Intravenous Stopped 05/04/16 1617)  insulin aspart (novoLOG) injection 5 Units (5 Units Subcutaneous Given 05/04/16 1556)     Initial Impression / Assessment and Plan / ED Course  I have reviewed the triage vital signs and the nursing notes.  Pertinent labs & imaging results that were available during my care of the patient were reviewed by me and considered in my medical decision making (see chart for details).  Clinical Course     [name] BudMargaret Viens is a 82 y.o. female here with confusion, hyperglycemia. Recently diagnosed with UTI and hyperglycemia. Will recheck labs, UA, CXR. Will check CBG and likely give insulin, IVF. Her oxygen level is borderline around 90-92% and has shortness of breath so will get BNP to r/o CHF. Will reassess.   6:17 PM Labs showed Na 122, glucose 423, Cr 2.02. AG nl. I think likely confusion from hyponatremia. Given IVF,sub Q insulin and glucose improved to 325. CXR showed possible widened mediastinum but CT chest showed no enlarged aorta. Repeat UA showed no UTI. Likely confusion from hyponatremia and hyperglycemia. Will admit for observation.    Final Clinical Impressions(s) /  ED Diagnoses   Final diagnoses:  None    New Prescriptions New Prescriptions   No medications on file     Charlynne Panderavid Hsienta Yao, MD 05/04/16 1820

## 2016-05-04 NOTE — H&P (Signed)
[facility] [place] at [facility]   PATIENT NAME: [name] [name] Termine    MR#:  [id number]  DATE OF BIRTH:  21-Mar-1926  DATE OF ADMISSION:  05/04/2016  PRIMARY CARE PHYSICIAN: Barbette ReichmannHANDE,VISHWANATH, MD   REQUESTING/REFERRING PHYSICIAN: dr Silverio Layyao  CHIEF COMPLAINT:  Confusion and elevated sugars for past 1 week  HISTORY OF PRESENT ILLNESS:  [name] [name] Deangelo  is a 82 y.o. female with a known history of Type 2 diabetes which has been uncontrolled, morbid obesity, hypertension, CK-MB stage III, chronic atrial fibrillation on Coumadin comes in the emergency room after she was found confused/altered mental status had a fall 2 times daily with bruising over her fingers on the right and left hands. Patient was brought in by the daughter with uncontrolled sugar was 485. Repeat sugar after giving insulin subcutaneous was 325. Patient has been at [facility] and does not get that carb-controlled diet. She takes Lantus 30 units twice a day and glipizide 5 mg daily. Her sugar sugars have been waxing and waning. She recently had UTI completed a course of Keflex about a week ago. Patient presents with sodium of 122 and appears somewhat dehydrated with elevated sugars. She is being admitted with uncontrolled type 2 diabetes, acute on chronic renal failure, hyponatremia for further evaluation management.  PAST MEDICAL HISTORY:   Past Medical History:  Diagnosis Date  . A-fib (HCC)   . Diabetes mellitus without complication (HCC)   . Hypertension     PAST SURGICAL HISTOIRY:   Past Surgical History:  Procedure Laterality Date  . CORONARY ARTERY BYPASS GRAFT      SOCIAL HISTORY:   Social History  Substance Use Topics  . Smoking status: Never Smoker  . Smokeless tobacco: Never Used  . Alcohol use No    FAMILY HISTORY:  History reviewed. No pertinent family history.  DRUG ALLERGIES:  No Known Allergies  REVIEW OF SYSTEMS:  Review of Systems  Constitutional: Positive for  malaise/fatigue. Negative for chills, fever and weight loss.  HENT: Negative for ear discharge, ear pain and nosebleeds.   Eyes: Negative for blurred vision, pain and discharge.  Respiratory: Positive for shortness of breath. Negative for sputum production, wheezing and stridor.   Cardiovascular: Negative for chest pain, palpitations, orthopnea and PND.  Gastrointestinal: Negative for abdominal pain, diarrhea, nausea and vomiting.  Genitourinary: Negative for frequency and urgency.  Musculoskeletal: Negative for back pain and joint pain.  Neurological: Positive for weakness. Negative for sensory change, speech change and focal weakness.  Psychiatric/Behavioral: Negative for depression and hallucinations. The patient is not nervous/anxious.      MEDICATIONS AT HOME:   Prior to Admission medications   Medication Sig Start Date End Date Taking? Authorizing Provider  amLODipine (NORVASC) 5 MG tablet Take 5 mg by mouth daily.   Yes Historical Provider, MD  aspirin 81 MG EC tablet Take 1 tablet by mouth daily. 04/05/16  Yes Historical Provider, MD  bumetanide (BUMEX) 1 MG tablet Take 1 tablet by mouth daily.  04/02/16  Yes Historical Provider, MD  Cyanocobalamin 2500 MCG CHEW Take 1 tablet by mouth daily.   Yes Historical Provider, MD  glipiZIDE (GLUCOTROL) 5 MG tablet Take 5 mg by mouth daily. 03/22/16  Yes Historical Provider, MD  LANTUS SOLOSTAR 100 UNIT/ML Solostar Pen Inject 30 Units into the skin 2 (two) times daily. 03/15/16  Yes Historical Provider, MD  losartan-hydrochlorothiazide (HYZAAR) 100-12.5 MG tablet Take 1 tablet by mouth daily. 04/05/16  Yes Historical Provider, MD  metoprolol (LOPRESSOR)  50 MG tablet Take 1 tablet by mouth 2 (two) times daily. 04/05/16  Yes Historical Provider, MD  Multiple Vitamins-Minerals (OCUVITE EYE + MULTI) TABS Take 1 tablet by mouth daily.   Yes Historical Provider, MD  multivitamin (ONE-A-DAY MEN'S) TABS tablet Take 1 tablet by mouth daily.   Yes  Historical Provider, MD  potassium chloride SA (K-DUR,KLOR-CON) 20 MEQ tablet Take 10 tablets by mouth every other day.    Yes Historical Provider, MD  warfarin (COUMADIN) 2.5 MG tablet Take 2.5 mg by mouth daily. 04/02/16  Yes Historical Provider, MD  cephALEXin (KEFLEX) 500 MG capsule Take 1 capsule (500 mg total) by mouth 3 (three) times daily. Patient not taking: Reported on 05/04/2016 04/23/16   Minna Antis, MD      VITAL SIGNS:  Blood pressure 121/69, pulse (!) 58, temperature 98.2 F (36.8 C), temperature source Oral, resp. rate 10, height 5\' 3"  (1.6 m), weight 81.6 kg (180 lb), SpO2 93 %.  PHYSICAL EXAMINATION:  GENERAL:  82 y.o.-year-old patient lying in the bed with no acute distress. Obese EYES: Pupils equal, round, reactive to light and accommodation. No scleral icterus. Extraocular muscles intact.  HEENT: Head atraumatic, normocephalic. Oropharynx and nasopharynx clear.  NECK:  Supple, no jugular venous distention. No thyroid enlargement, no tenderness.  LUNGS: Normal breath sounds bilaterally, no wheezing, rales,rhonchi or crepitation. No use of accessory muscles of respiration.  CARDIOVASCULAR: S1, S2 normal. No murmurs, rubs, or gallops.  ABDOMEN: Soft, nontender, nondistended. Bowel sounds present. No organomegaly or mass.  EXTREMITIES:+ pedal edema, no cyanosis, or clubbing. Bruise over the right forearm and left hand fingers status post fall NEUROLOGIC: Cranial nerves II through XII are intact. Muscle strength 5/5 in all extremities. Sensation intact. Gait not checked.  PSYCHIATRIC: The patient is alert and oriented x 3.  SKIN: No obvious rash, lesion, or ulcer.   LABORATORY PANEL:   CBC  Recent Labs Lab 05/04/16 1310  WBC 8.1  HGB 11.8*  HCT 35.1  PLT 188   ------------------------------------------------------------------------------------------------------------------  Chemistries   Recent Labs Lab 05/04/16 1310  NA 122*  K 4.4  CL 85*  CO2 26   GLUCOSE 423*  BUN 53*  CREATININE 2.02*  CALCIUM 8.6*  AST 31  ALT 22  ALKPHOS 114  BILITOT 0.9   ------------------------------------------------------------------------------------------------------------------  Cardiac Enzymes  Recent Labs Lab 05/04/16 1534  TROPONINI 0.03*   ------------------------------------------------------------------------------------------------------------------  RADIOLOGY:  Ct Chest Wo Contrast  Result Date: 05/04/2016 CLINICAL DATA:  Shortness of Breath EXAM: CT CHEST WITHOUT CONTRAST TECHNIQUE: Multidetector CT imaging of the chest was performed following the standard protocol without IV contrast. COMPARISON:  Chest radiograph May 04, 2016 FINDINGS: Cardiovascular: There is no demonstrable thoracic aortic aneurysm. There is atherosclerotic calcification in the aorta. There are multiple foci of coronary artery calcification. Pericardium is not thickened. The visualized great vessels appear unremarkable except for mild calcification at the origins of the left and right common carotid arteries. There is marked dilatation of the main pulmonary outflow tract consistent with pulmonary arterial hypertension. The main pulmonary outflow tract has a maximum measured diameter of 4.9 cm. Mediastinum/Nodes: The thyroid is mildly inhomogeneous in appearance without dominant mass evident. There are subcentimeter mediastinal lymph nodes at several sites. There is a lymph node just anterior to the distal trachea measuring 1.4 x 1.2 cm. There is a sub- carinal lymph node measuring 1.2 x 1.0 cm. Lungs/Pleura: There is mild scarring in each lung apex. There is also scarring in the posterior segment of the  left upper lobe and in the anterior lung bases bilaterally. There is mild left base atelectasis. There is no appreciable edema or consolidation. Upper Abdomen: There is atherosclerotic calcification in the aorta and major mesenteric vessels. Of the liver has a somewhat  lobular contour, raising question of a degree of underlying dx hepatic cirrhosis. Caudate lobe is also hypertrophied with a relatively small left lobe, findings that may be seen with hepatic cirrhosis. Musculoskeletal: No blastic or lytic bone lesions are evident. There is degenerative change in the thoracic spine. IMPRESSION: Areas of scarring bilaterally, primarily on the left. No edema or consolidation. Pulmonary arterial hypertension with marked enlargement of the main pulmonary outflow tract. There is fairly rapid peripheral tapering. Multiple foci of atherosclerotic calcification. Multiple foci of coronary artery calcification noted. There are two mildly prominent mediastinal lymph nodes which may well be reactive in etiology given other findings. Contour of the liver is suggestive of hepatic cirrhosis. Appropriate laboratory correlation advised. Electronically Signed   By: Bretta BangWilliam  Woodruff III M.D.   On: 05/04/2016 18:04    EKG:  Atrial fibrillation with heart rate in the 50s  IMPRESSION AND PLAN:  [name] [name]  is a 82 y.o. female with a known history of Type 2 diabetes which has been uncontrolled, morbid obesity, hypertension, CK-MB stage III, chronic atrial fibrillation on Coumadin comes in the emergency room after she was found confused/altered mental status had a fall 2 times daily with bruising over her fingers on the right and left hands. Patient was brought in by the daughter with uncontrolled sugar was 485. Repeat sugar after giving insulin subcutaneous was 325.   1. Acute encephalopathy/altered mental status  due to uncontrolled type 2 diabetes with pseudohyponatremia and clinical dehydration with acute on chronic renal failure -Admit to medical floor IV fluids -Continue Lantus 30 units twice a day, will start patient on aspart 8 units 3 times a day with meals and continue glipizide 5 mg daily -Carb controlled diet -Diabetes coordinator consultation  2. Acute on chronic renal  failure stage III -Baseline creatinine around 1.8 -IV fluids -Avoid nephrotoxins, monitor I's and O's  3. Hyponatremia appears secondary to uncontrolled sugars -Treatment as above  4. Fall -Physical therapy to evaluate  5. Chronic A. fib on Coumadin -Pharmacy to dose Coumadin  6. DVT prophylaxis patient already on Coumadin  Care management for discharge planning All the records are reviewed and case discussed with ED provider. Management plans discussed with the patient, family and they are in agreement.  CODE STATUS: DO NOT RESUSCITATE this was discussed with patient and patient's daughter present in the ER  TOTAL TIME TAKING CARE OF THIS PATIENT: 50 minutes.    Adreonna Yontz M.D on 05/04/2016 at 7:11 PM  Between 7am to 6pm - Pager - [phone]  After 6pm go to [email or web address] - password [email or web address] [facility]  [facility]  Office  [phone]  CC: Primary care physician; Barbette ReichmannHANDE,VISHWANATH, MD

## 2016-05-04 NOTE — ED Notes (Signed)
Pt eating sandwich at this time.

## 2016-05-04 NOTE — ED Notes (Signed)
Pt reports hyperglycemia xfew days. Pt was seen here reccently for UTI and given antibiotics. Per family pt has still be confused.

## 2016-05-04 NOTE — ED Triage Notes (Signed)
Pt to ed with family who reports pt has had elevated blood glucose x several days.  Reports this am was 496.  Pt family also states recent UTI and confusion.

## 2016-05-05 LAB — URINE CULTURE: Culture: NO GROWTH

## 2016-05-05 LAB — BASIC METABOLIC PANEL
ANION GAP: 9 (ref 5–15)
BUN: 47 mg/dL — ABNORMAL HIGH (ref 6–20)
CALCIUM: 9 mg/dL (ref 8.9–10.3)
CO2: 31 mmol/L (ref 22–32)
Chloride: 94 mmol/L — ABNORMAL LOW (ref 101–111)
Creatinine, Ser: 1.72 mg/dL — ABNORMAL HIGH (ref 0.44–1.00)
GFR, EST AFRICAN AMERICAN: 29 mL/min — AB (ref >60)
GFR, EST NON AFRICAN AMERICAN: 25 mL/min — AB (ref >60)
GLUCOSE: 192 mg/dL — AB (ref 65–99)
Potassium: 4.7 mmol/L (ref 3.5–5.1)
Sodium: 134 mmol/L — ABNORMAL LOW (ref 135–145)

## 2016-05-05 LAB — PROTIME-INR
INR: 3.29
Prothrombin Time: 34.2 seconds — ABNORMAL HIGH (ref 11.4–15.2)

## 2016-05-05 LAB — GLUCOSE, CAPILLARY
GLUCOSE-CAPILLARY: 216 mg/dL — AB (ref 65–99)
GLUCOSE-CAPILLARY: 255 mg/dL — AB (ref 65–99)
GLUCOSE-CAPILLARY: 284 mg/dL — AB (ref 65–99)
Glucose-Capillary: 135 mg/dL — ABNORMAL HIGH (ref 65–99)

## 2016-05-05 MED ORDER — INSULIN ASPART 100 UNIT/ML ~~LOC~~ SOLN
0.0000 [IU] | Freq: Three times a day (TID) | SUBCUTANEOUS | Status: DC
  Administered 2016-05-05: 5 [IU] via SUBCUTANEOUS
  Administered 2016-05-06: 2 [IU] via SUBCUTANEOUS
  Filled 2016-05-05: qty 2

## 2016-05-05 MED ORDER — INSULIN ASPART 100 UNIT/ML ~~LOC~~ SOLN
4.0000 [IU] | Freq: Three times a day (TID) | SUBCUTANEOUS | Status: DC
  Administered 2016-05-05 – 2016-05-06 (×4): 4 [IU] via SUBCUTANEOUS
  Filled 2016-05-05 (×4): qty 4

## 2016-05-05 MED ORDER — INSULIN ASPART 100 UNIT/ML ~~LOC~~ SOLN
0.0000 [IU] | Freq: Every day | SUBCUTANEOUS | Status: DC
  Administered 2016-05-05: 3 [IU] via SUBCUTANEOUS
  Filled 2016-05-05: qty 3
  Filled 2016-05-05: qty 1

## 2016-05-05 MED ORDER — INSULIN GLARGINE 100 UNIT/ML ~~LOC~~ SOLN
30.0000 [IU] | Freq: Every day | SUBCUTANEOUS | Status: DC
  Administered 2016-05-05: 30 [IU] via SUBCUTANEOUS
  Filled 2016-05-05 (×2): qty 0.3

## 2016-05-05 MED ORDER — BUMETANIDE 1 MG PO TABS
1.0000 mg | ORAL_TABLET | Freq: Every day | ORAL | Status: DC
  Administered 2016-05-05 – 2016-05-06 (×2): 1 mg via ORAL
  Filled 2016-05-05 (×2): qty 1

## 2016-05-05 NOTE — Progress Notes (Signed)
Patient ID: [name] [name], female   DOB: 12-Nov-1925, 82 y.o.   MRN: [id number]  [facility] PROGRESS NOTE  [name] [name] WUJ:[id number] DOB: Dec 10, 1925 DOA: 05/04/2016 PCP: Barbette Reichmann, MD  HPI/Subjective: Patient brought in with weakness high sugars and shortness of breath on exertion. She had a fall and hurt her left ribs.  Objective: Vitals:   05/05/16 0819 05/05/16 1446  BP: (!) 113/58   Pulse: 64 75  Resp: 19   Temp: 98.4 F (36.9 C)     Filed Weights   05/04/16 1309 05/04/16 2100  Weight: 81.6 kg (180 lb) 85.2 kg (187 lb 14.4 oz)    ROS: Review of Systems  Constitutional: Negative for chills and fever.  Eyes: Negative for blurred vision.  Respiratory: Positive for shortness of breath. Negative for cough.   Cardiovascular: Negative for chest pain.  Gastrointestinal: Negative for abdominal pain, constipation, diarrhea, nausea and vomiting.  Genitourinary: Negative for dysuria.  Musculoskeletal: Positive for joint pain.  Neurological: Negative for dizziness and headaches.   Exam: Physical Exam  Constitutional: She is oriented to person, place, and time.  HENT:  Nose: No mucosal edema.  Mouth/Throat: No oropharyngeal exudate or posterior oropharyngeal edema.  Eyes: Conjunctivae, EOM and lids are normal. Pupils are equal, round, and reactive to light.  Neck: No JVD present. Carotid bruit is not present. No edema present. No thyroid mass and no thyromegaly present.  Cardiovascular: S1 normal and S2 normal.  Exam reveals no gallop.   No murmur heard. Pulses:      Dorsalis pedis pulses are 2+ on the right side, and 2+ on the left side.  Respiratory: No respiratory distress. She has no wheezes. She has no rhonchi. She has rales in the right lower field and the left lower field.  GI: Soft. Bowel sounds are normal. There is no tenderness.  Musculoskeletal:       Right ankle: She exhibits swelling.       Left ankle: She exhibits swelling.  Lymphadenopathy:     She has no cervical adenopathy.  Neurological: She is alert and oriented to person, place, and time. No cranial nerve deficit.  Skin: Skin is warm. No rash noted. Nails show no clubbing.  Psychiatric: She has a normal mood and affect.      Data Reviewed: Basic Metabolic Panel:  Recent Labs Lab 05/04/16 1310 05/05/16 0322  NA 122* 134*  K 4.4 4.7  CL 85* 94*  CO2 26 31  GLUCOSE 423* 192*  BUN 53* 47*  CREATININE 2.02* 1.72*  CALCIUM 8.6* 9.0   Liver Function Tests:  Recent Labs Lab 05/04/16 1310  AST 31  ALT 22  ALKPHOS 114  BILITOT 0.9  PROT 7.2  ALBUMIN 3.3*   CBC:  Recent Labs Lab 05/04/16 1310 05/04/16 2228  WBC 8.1 7.6  HGB 11.8* 11.8*  HCT 35.1 34.8*  MCV 91.9 91.6  PLT 188 166   Cardiac Enzymes:  Recent Labs Lab 05/04/16 1534  TROPONINI 0.03*   BNP (last 3 results)  Recent Labs  05/04/16 1534  BNP 286.0*     CBG:  Recent Labs Lab 05/04/16 1648 05/04/16 2021 05/04/16 2128 05/05/16 0838 05/05/16 1120  GLUCAP 325* 238* 263* 135* 216*    Recent Results (from the past 240 hour(s))  Urine culture     Status: None   Collection Time: 05/04/16  3:23 PM  Result Value Ref Range Status   Specimen Description URINE, RANDOM  Final   Special Requests NONE  Final  Culture NO GROWTH [facility]   Final   Report Status 05/05/2016 FINAL  Final  Blood culture (routine x 2)     Status: None (Preliminary result)   Collection Time: 05/04/16  3:34 PM  Result Value Ref Range Status   Specimen Description BLOOD L AC  Final   Special Requests   Final    BOTTLES DRAWN AEROBIC AND ANAEROBIC AER 5ML ANA 3ML   Culture NO GROWTH < 24 HOURS  Final   Report Status PENDING  Incomplete  Blood culture (routine x 2)     Status: None (Preliminary result)   Collection Time: 05/04/16  3:34 PM  Result Value Ref Range Status   Specimen Description BLOOD RT ARM  Final   Special Requests BOTTLES DRAWN AEROBIC AND ANAEROBIC 3CC  Final    Culture NO GROWTH < 24 HOURS  Final   Report Status PENDING  Incomplete  MRSA PCR Screening     Status: None   Collection Time: 05/04/16 10:00 PM  Result Value Ref Range Status   MRSA by PCR NEGATIVE NEGATIVE Final    Comment:        The GeneXpert MRSA Assay (FDA approved for NASAL specimens only), is one component of a comprehensive MRSA colonization surveillance program. It is not intended to diagnose MRSA infection nor to guide or monitor treatment for MRSA infections.      Studies: Ct Chest Wo Contrast  Result Date: 05/04/2016 CLINICAL DATA:  Shortness of Breath EXAM: CT CHEST WITHOUT CONTRAST TECHNIQUE: Multidetector CT imaging of the chest was performed following the standard protocol without IV contrast. COMPARISON:  Chest radiograph May 04, 2016 FINDINGS: Cardiovascular: There is no demonstrable thoracic aortic aneurysm. There is atherosclerotic calcification in the aorta. There are multiple foci of coronary artery calcification. Pericardium is not thickened. The visualized great vessels appear unremarkable except for mild calcification at the origins of the left and right common carotid arteries. There is marked dilatation of the main pulmonary outflow tract consistent with pulmonary arterial hypertension. The main pulmonary outflow tract has a maximum measured diameter of 4.9 cm. Mediastinum/Nodes: The thyroid is mildly inhomogeneous in appearance without dominant mass evident. There are subcentimeter mediastinal lymph nodes at several sites. There is a lymph node just anterior to the distal trachea measuring 1.4 x 1.2 cm. There is a sub- carinal lymph node measuring 1.2 x 1.0 cm. Lungs/Pleura: There is mild scarring in each lung apex. There is also scarring in the posterior segment of the left upper lobe and in the anterior lung bases bilaterally. There is mild left base atelectasis. There is no appreciable edema or consolidation. Upper Abdomen: There is atherosclerotic  calcification in the aorta and major mesenteric vessels. Of the liver has a somewhat lobular contour, raising question of a degree of underlying dx hepatic cirrhosis. Caudate lobe is also hypertrophied with a relatively small left lobe, findings that may be seen with hepatic cirrhosis. Musculoskeletal: No blastic or lytic bone lesions are evident. There is degenerative change in the thoracic spine. IMPRESSION: Areas of scarring bilaterally, primarily on the left. No edema or consolidation. Pulmonary arterial hypertension with marked enlargement of the main pulmonary outflow tract. There is fairly rapid peripheral tapering. Multiple foci of atherosclerotic calcification. Multiple foci of coronary artery calcification noted. There are two mildly prominent mediastinal lymph nodes which may well be reactive in etiology given other findings. Contour of the liver is suggestive of hepatic cirrhosis. Appropriate laboratory correlation advised. Electronically Signed  By: Bretta BangWilliam  Woodruff III M.D.   On: 05/04/2016 18:04    Scheduled Meds: . amLODipine  5 mg Oral Daily  . aspirin EC  81 mg Oral Daily  . bumetanide  1 mg Oral Daily  . insulin aspart  0-5 Units Subcutaneous QHS  . insulin aspart  0-9 Units Subcutaneous TID WC  . insulin aspart  4 Units Subcutaneous TID WC  . insulin glargine  30 Units Subcutaneous QHS  . multivitamin with minerals  1 tablet Oral Daily  . multivitamin-lutein  1 capsule Oral Daily  . potassium chloride SA  20 mEq Oral QODAY  . vitamin B-12  2,500 mcg Oral Daily    Assessment/Plan:  1. Shortness of breath with exertion, fluid overload. Stop IV fluids and restart Bumex. 2. Uncontrolled diabetes mellitus. Hemoglobin A1c pending. Sugar this morning 135 and 216 this afternoon. I decrease the patient's Lantus to 30 units daily at bedtime instead of twice a day. May need to increase Lantus this evening if evening sugar is high. 3. Weakness. Physical therapy evaluation appreciated.  They recommended home with home health. 4. Acute kidney disease on chronic kidney disease stage III. Continue to monitor. 5. Hyponatremia this has resolved. Continue to hold hydrochlorothiazide. 6. Essential hypertension continue Bumex 7. Atrial fibrillation. Coumadin level therapeutic. Heart rate controlled.  Code Status:     Code Status Orders        Start     Ordered   05/04/16 2057  Do not attempt resuscitation (DNR)  Continuous    Question Answer Comment  In the event of cardiac or respiratory ARREST Do not call a "code blue"   In the event of cardiac or respiratory ARREST Do not perform Intubation, CPR, defibrillation or ACLS   In the event of cardiac or respiratory ARREST Use medication by any route, position, wound care, and other measures to relive pain and suffering. May use oxygen, suction and manual treatment of airway obstruction as needed for comfort.      05/04/16 2056    Code Status History    Date Active Date Inactive Code Status Order ID Comments User Context   This patient has a current code status but no historical code status.    Advance Directive Documentation   Flowsheet Row Most Recent Value  Type of Advance Directive  Healthcare Power of Attorney  Pre-existing out of facility DNR order (yellow form or pink MOST form)  No data  "MOST" Form in Place?  No data     Family Communication: Daughter at bedside Disposition Plan: Potential home tomorrow  Time spent: 28 minutes  Alford HighlandWIETING, Ayrianna Mcginniss  [employer]

## 2016-05-05 NOTE — Clinical Social Work Note (Signed)
Clinical Social Work Assessment  Patient Details  Name: [name] [name] MRN: [id number] Date of Birth: March 01, 1926  Date of referral:  05/05/16               Reason for consult:  Discharge Planning                Permission sought to share information with:    Permission granted to share information::     Name::        Agency::     Relationship::     Contact Information:     Housing/Transportation Living arrangements for the past 2 months:  [employer] of Information:  Patient Patient Interpreter Needed:  None Criminal Activity/Legal Involvement Pertinent to Current Situation/Hospitalization:  No - Comment as needed Significant Relationships:  Adult Children Lives with:  Self Do you feel safe going back to the place where you live?  Yes Need for family participation in patient care:  Yes (Comment)  Care giving concerns:  Patient is from [place].    Social Worker assessment / plan:  Holiday representative (CSW) reviewed chart, which noted that patient is from a facility. Patient is not from a facility she lives at [facility]. CSW met with patient to discuss D/C plan. Patient was alert and oriented and laying in the bed. CSW introduced self and explained role of CSW department. Patient reported that she has been at [facility] for 1 month now and recently moved to [facility] from [place] to be closer to her daughter Stanton Kidney. CSW explained that PT will work with patient and make a recommendation of home health or SNF. Patient prefers to go home. CSW will continue to follow and assist as needed.   Employment status:  Retired Forensic scientist:  Medicare PT Recommendations:  Not assessed at this time [place] / Referral to community resources:  Other (Comment Required), [place] (Home Health VS. SNF )  Patient/Family's Response to care:  Patient prefers to go home with home health.   Patient/Family's  Understanding of and Emotional Response to Diagnosis, Current Treatment, and Prognosis:  Patient was pleasant and thanked CSW for assistance.   Emotional Assessment Appearance:  Appears stated age Attitude/Demeanor/Rapport:    Affect (typically observed):  Accepting, Adaptable, Pleasant Orientation:  Oriented to Self, Oriented to Place, Oriented to  Time, Oriented to Situation Alcohol / Substance use:  Not Applicable Psych involvement (Current and /or in the community):  No (Comment)  Discharge Needs  Concerns to be addressed:  Discharge Planning Concerns Readmission within the last 30 days:  No Current discharge risk:  Dependent with Mobility Barriers to Discharge:  Continued Medical Work up   [employer], Veronia Beets, LCSW 05/05/2016, 2:09 PM

## 2016-05-05 NOTE — Evaluation (Signed)
Physical Therapy Evaluation Patient Details Name: [name] BudMargaret Schiffer MRN: [id number] DOB: 1925-08-10 Today's Date: 05/05/2016   History of Present Illness  Pt is a 82 y.o. female with a known history of Type 2 diabetes which has been uncontrolled, morbid obesity, hypertension, CK-MB stage III, chronic atrial fibrillation on Coumadin comes in the emergency room after she was found confused/altered mental status had a fall 2 times daily with bruising over her fingers on the right and left hands. Patient was brought in by the daughter with uncontrolled sugar was 485. [facility] and does not get that carb-controlled diet. She takes Lantus 30 units twice a day and glipizide 5 mg daily. Her sugars have been waxing and waning. She recently had UTI completed a course of Keflex about a week ago.  Clinical Impression  Pt presents with deficits in transfers, gait, balance, and activity tolerance.  Pt presents with BLE strength that is [facility] but poor activity tolerance and some unsteadiness on feet without UE support.  Pt was able to amb a max of 60' with min-mod SOB on room air with SpO2 dropping from baseline of 93% to 89-90%.  SpO2 returned to baseline in 20-30 sec upon sitting.  Pt will benefit from PT services to address the above deficits for decreased caregiver assistance upon d/c.     Follow Up Recommendations Home health PT 24/hr assist recommended   Equipment Recommendations  None recommended by PT (Pt owns QC, RW, and rollator)    Recommendations for Other Services       Precautions / Restrictions Precautions Precautions: Fall Restrictions Weight Bearing Restrictions: No      Mobility  Bed Mobility Overal bed mobility: Independent                Transfers Overall transfer level: Needs assistance Equipment used: Rolling walker (2 wheeled) Transfers: Sit to/from Stand Sit to Stand: Supervision         General transfer comment:  Min verbal cues for sequencing  Ambulation/Gait Ambulation/Gait assistance: Supervision Ambulation Distance (Feet): 60 Feet Assistive device: Rolling walker (2 wheeled) Gait Pattern/deviations: Step-through pattern;Trunk flexed;Decreased step length - left;Decreased step length - right   Gait velocity interpretation: Below normal speed for age/gender General Gait Details: Slow cadence but steady, limited by deficits in activity tolerance, min-mod SOB after ambluation with SpO2 89-91% returning to 93% after sitting fro 30 sec  Stairs            Wheelchair Mobility    Modified Rankin (Stroke Patients Only)       Balance Overall balance assessment: Needs assistance   Sitting balance-Leahy Scale: Good     Standing balance support: No upper extremity supported Standing balance-Leahy Scale: Fair                               Pertinent Vitals/Pain Pain Assessment: No/denies pain Pain Score: 1  Pain Location: L side around rib cage Pain Descriptors / Indicators: Discomfort Pain Intervention(s): Monitored during session    Home Living Family/patient expects to be discharged to:: Private residence [facility]) Living Arrangements: Alone   Type of Home: Independent living facility Home Access: Level entry              Prior Function Level of Independence: Independent with assistive device(s)         Comments: Ind amb facility distances with QC, owns RW and rollator,  Ind with ADLs, facility provides meals with pt ambulating to dining area     Hand Dominance        Extremity/Trunk Assessment   Upper Extremity Assessment: Overall WFL for tasks assessed           Lower Extremity Assessment: Overall WFL for tasks assessed         Communication   Communication: No difficulties  Cognition Arousal/Alertness: Awake/alert Behavior During Therapy: WFL for tasks assessed/performed Overall Cognitive Status: Within  Functional Limits for tasks assessed                      General Comments      Exercises Other Exercises Other Exercises: Static balance training without UE support with feet together, apart, and semi-tandem with combinations of eyes open/closed and head still/head turns.  Min-mod instability during more complex tasks.   Assessment/Plan    PT Assessment Patient needs continued PT services  PT Problem List Decreased activity tolerance;Decreased balance          PT Treatment Interventions DME instruction;Gait training;Therapeutic activities;Therapeutic exercise;Balance training;Neuromuscular re-education;Patient/family education;Functional mobility training    PT Goals (Current goals can be found in the Care Plan section)  Acute Rehab PT Goals Patient Stated Goal: To walk better PT Goal Formulation: With patient Time For Goal Achievement: 05/18/16 Potential to Achieve Goals: Good    Frequency Min 2X/week   Barriers to discharge        Co-evaluation               End of Session Equipment Utilized During Treatment: Gait belt Activity Tolerance: Patient limited by fatigue Patient left: in chair;with chair alarm set;with call bell/phone within reach;with family/visitor present           Time: 1312-1350 PT Time Calculation (min) (ACUTE ONLY): 38 min   Charges:   PT Evaluation $PT Eval Low Complexity: 1 Procedure PT Treatments $Therapeutic Exercise: 8-22 mins   PT G Codes:        Elly Modena. Scott Jojo Geving PT, DPT 05/05/16, 3:09 PM

## 2016-05-05 NOTE — Progress Notes (Signed)
Inpatient Diabetes Program Recommendations  AACE/ADA: New Consensus Statement on Inpatient Glycemic Control (2015)  Target Ranges:  Prepandial:   less than 140 mg/dL      Peak postprandial:   less than 180 mg/dL (1-2 hours)      Critically ill patients:  140 - 180 mg/dL   Results for [name] [name], [name] (MRN [id number]) as of 05/05/2016 09:32  Ref. Range 05/04/2016 16:48 05/04/2016 20:21 05/04/2016 21:28 05/05/2016 08:38  Glucose-Capillary Latest Ref Range: 65 - 99 mg/dL 409325 (H) 811238 (H) 914263 (H) 135 (H)    Admit with: Hyperglycemia  History: DM, CKD  Home DM Meds: Lantus 30 units BID       Glipizide 5 mg daily  Current Insulin Orders: Lantus 30 units QHS      Novolog 4 units TIDWC       -Note patient admitted with glucose 423 mg/dl on BMET.  Fortunately CO2 and Anion Gap were WNL.  -Hydrated and started on insulin.  -Lantus 30 units given last PM.  AM CBG much improved today: 135 mg/dl.  -Current N8GA1c level pending.     MD- Please consider starting Novolog Sensitive Correction Scale/ SSI (0-9 units) TID AC + HS   Would continue Lantus and Novolog 4 units TIDWC as well     --Will follow patient during hospitalization--  Ambrose FinlandJeannine Johnston Adithya Difrancesco RN, MSN, CDE Diabetes Coordinator Inpatient Glycemic Control Team Team Pager: [phone] (8a-5p)

## 2016-05-05 NOTE — Care Management (Addendum)
Met with patient at bedside. She lives alone at [facility]. She states she takes her medications as prescribed, including insulin. She checks her FSBG twice a day. She does say she is not familiar with a diabetic diet. She eats her meals in the dining room at [facility] and states she has very little choices and no diabetic specific meals. Would benefit from a [facility] RN/PT. No agency preference. [facility]erral to [facility].

## 2016-05-06 LAB — PROTIME-INR
INR: 2.73
Prothrombin Time: 29.5 seconds — ABNORMAL HIGH (ref 11.4–15.2)

## 2016-05-06 LAB — GLUCOSE, CAPILLARY: GLUCOSE-CAPILLARY: 199 mg/dL — AB (ref 65–99)

## 2016-05-06 LAB — HEMOGLOBIN A1C
HEMOGLOBIN A1C: 12.8 % — AB (ref 4.8–5.6)
Mean Plasma Glucose: 321 mg/dL

## 2016-05-06 MED ORDER — WARFARIN - PHARMACIST DOSING INPATIENT: Freq: Every day | Status: DC

## 2016-05-06 MED ORDER — LOSARTAN POTASSIUM 25 MG PO TABS
25.0000 mg | ORAL_TABLET | Freq: Every day | ORAL | Status: DC
  Administered 2016-05-06: 25 mg via ORAL
  Filled 2016-05-06: qty 1

## 2016-05-06 MED ORDER — WARFARIN SODIUM 2.5 MG PO TABS: 2.5000 mg | ORAL_TABLET | Freq: Every day | ORAL | Status: DC

## 2016-05-06 MED ORDER — INSULIN ASPART 100 UNIT/ML ~~LOC~~ SOLN: 5.0000 [IU] | Freq: Three times a day (TID) | SUBCUTANEOUS | 11 refills | Status: DC

## 2016-05-06 MED ORDER — LOSARTAN POTASSIUM 25 MG PO TABS: 25.0000 mg | ORAL_TABLET | Freq: Every day | ORAL | 0 refills | Status: AC

## 2016-05-06 MED ORDER — TRAMADOL HCL 50 MG PO TABS: 50.0000 mg | ORAL_TABLET | Freq: Two times a day (BID) | ORAL | Status: DC | PRN

## 2016-05-06 MED ORDER — LANTUS SOLOSTAR 100 UNIT/ML ~~LOC~~ SOPN: 36.0000 [IU] | PEN_INJECTOR | Freq: Every day | SUBCUTANEOUS | 0 refills | Status: AC

## 2016-05-06 MED ORDER — METOPROLOL TARTRATE 25 MG PO TABS: 25.0000 mg | ORAL_TABLET | Freq: Two times a day (BID) | ORAL | 0 refills | Status: AC

## 2016-05-06 MED ORDER — METOPROLOL TARTRATE 25 MG PO TABS
25.0000 mg | ORAL_TABLET | Freq: Two times a day (BID) | ORAL | Status: DC
  Administered 2016-05-06: 25 mg via ORAL
  Filled 2016-05-06: qty 1

## 2016-05-06 NOTE — Care Management Important Message (Signed)
Important Message  Patient Details  Name: Finis BudMargaret Mincy MRN: [id number] Date of Birth: 20-Nov-1925   Medicare Important Message Given:  Yes    Marily MemosLisa M Carin Shipp, RN 05/06/2016, 10:30 AM

## 2016-05-06 NOTE — Progress Notes (Signed)
Inpatient Diabetes Program Recommendations  AACE/ADA: New Consensus Statement on Inpatient Glycemic Control (2015)  Target Ranges:  Prepandial:   less than 140 mg/dL      Peak postprandial:   less than 180 mg/dL (1-2 hours)      Critically ill patients:  140 - 180 mg/dL   Lab Results  Component Value Date   GLUCAP 199 (H) 05/06/2016   HGBA1C 12.8 (H) 05/05/2016    Review of Glycemic Control  Spoke to patient at her bedside- daughter with her.  [place] and has recently begun seeing Dr. Marcello FennelHande.  Patient and her daughter are aware of high blood sugars and would like to see a endocrinologist as well as family practice- [facility] MD names given to the patient. Encouraged to reach out to the office for an appointment and to continue to check blood sugars daily.  Daughter confirms that the patient has enough strips at home.  Susette RacerJulie Sukhmani Fetherolf, RN, BA, MHA, CDE Diabetes Coordinator Inpatient Diabetes Program  [phone] (Team Pager) [phone] [facility] Office) 05/06/2016 12:22 PM

## 2016-05-06 NOTE — Care Management Note (Signed)
Case Management Note  Patient Details  Name: [name] [name] MRN: [id number] Date of Birth: 11-25-1925  Subjective/Objective:    Discharging today.                Action/Plan: [facility] notified of need for SN and PT.   Expected Discharge Date:                  Expected Discharge Plan:  Home w Home Health Services  In-House Referral:     Discharge planning Services  CM Consult  Post Acute Care Choice:  Home Health Choice offered to:  Patient  DME Arranged:    DME Agency:     HH Arranged:  RN, PT HH Agency:  [facility]  Status of Service:  Completed, signed off  If discussed at Long Length of Stay Meetings, dates discussed:    Additional Comments:  Marily MemosLisa M Thien Berka, RN 05/06/2016, 10:27 AM

## 2016-05-06 NOTE — Progress Notes (Signed)
ANTICOAGULATION CONSULT NOTE - Initial Consult  Pharmacy Consult for Warfarin  Indication: atrial fibrillation  Allergies  Allergen Reactions  . Pineapple Hives    Patient Measurements: Height: 5' 2.5" (158.8 cm) Weight: 187 lb 14.4 oz (85.2 kg) IBW/kg (Calculated) : 51.25 Heparin Dosing Weight:   Vital Signs: Temp: 97.8 F (36.6 C) (11/16 0414) Temp Source: Oral (11/16 0414) BP: 115/50 (11/16 0414) Pulse Rate: 85 (11/16 0414)  Labs:  Recent Labs  05/04/16 1310 05/04/16 1534 05/04/16 2228 05/05/16 0322 05/06/16 0419  HGB 11.8*  --  11.8*  --   --   HCT 35.1  --  34.8*  --   --   PLT 188  --  166  --   --   LABPROT  --  33.1*  --  34.2* 29.5*  INR  --  3.16  --  3.29 2.73  CREATININE 2.02*  --   --  1.72*  --   TROPONINI  --  0.03*  --   --   --     Estimated Creatinine Clearance: 23.2 mL/min (by C-G formula based on SCr of 1.72 mg/dL (H)).   Medical History: Past Medical History:  Diagnosis Date  . A-fib (HCC)   . Diabetes mellitus without complication (HCC)   . Hypertension     Medications:  Prescriptions Prior to Admission  Medication Sig Dispense Refill Last Dose  . amLODipine (NORVASC) 5 MG tablet Take 5 mg by mouth daily.   05/04/2016 at 0900  . aspirin 81 MG EC tablet Take 1 tablet by mouth daily.  5 05/04/2016 at 0900  . bumetanide (BUMEX) 1 MG tablet Take 1 tablet by mouth daily.   1 05/04/2016 at 0900  . Cyanocobalamin 2500 MCG CHEW Take 1 tablet by mouth daily.   05/04/2016 at 0900  . glipiZIDE (GLUCOTROL) 5 MG tablet Take 5 mg by mouth daily.  3 05/04/2016 at 0900  . LANTUS SOLOSTAR 100 UNIT/ML Solostar Pen Inject 30 Units into the skin 2 (two) times daily.  0 05/04/2016 at 0900  . losartan-hydrochlorothiazide (HYZAAR) 100-12.5 MG tablet Take 1 tablet by mouth daily.  5 05/04/2016 at 0900  . metoprolol (LOPRESSOR) 50 MG tablet Take 1 tablet by mouth 2 (two) times daily.  5 05/04/2016 at 0900  . Multiple Vitamins-Minerals (OCUVITE EYE + MULTI)  TABS Take 1 tablet by mouth daily.   05/03/2016 at pm  . multivitamin (ONE-A-DAY MEN'S) TABS tablet Take 1 tablet by mouth daily.   05/03/2016 at pm  . potassium chloride SA (K-DUR,KLOR-CON) 20 MEQ tablet Take 10 tablets by mouth every other day.    05/04/2016 at 0900  . warfarin (COUMADIN) 2.5 MG tablet Take 2.5 mg by mouth daily.  0 05/04/2016 at 0900  . cephALEXin (KEFLEX) 500 MG capsule Take 1 capsule (500 mg total) by mouth 3 (three) times daily. (Patient not taking: Reported on 05/04/2016) 30 capsule 0 Completed Course at Unknown time    Assessment: Pharmacy consulted to dose warfarin in this 82 year old female admitted with Afib.  Pt was on warfarin 2.5 mg PO daily at home.  11/14 :  INR = 3.16 11/15 AM INR 3.29 11/16 INR 2.73  Goal of Therapy:  INR 2-3   Plan:  INR is therapeutic this AM after 2 days of hold. INR most likely elevated due to dehydration and possible from recent abx use. Will resume pt home dose of 2.5mg  daily and monitor closely.   Olene FlossMelissa D Maccia, Pharm.D Clinical Pharmacist  05/06/2016,7:40 AM

## 2016-05-06 NOTE — Discharge Summary (Signed)
[facility] - [place] at [facility]   PATIENT NAME: [name] [name]    MR#:  [id number]  DATE OF BIRTH:  September 19, 1925  DATE OF ADMISSION:  05/04/2016 ADMITTING PHYSICIAN: Enedina Finner, MD  DATE OF DISCHARGE: 05/06/2016  2:11 PM  PRIMARY CARE PHYSICIAN: Barbette Reichmann, MD    ADMISSION DIAGNOSIS:  elevated blood sugar  DISCHARGE DIAGNOSIS:  Active Problems:   Uncontrolled diabetes mellitus (HCC)   SECONDARY DIAGNOSIS:   Past Medical History:  Diagnosis Date  . A-fib (HCC)   . Diabetes mellitus without complication (HCC)   . Hypertension     HOSPITAL COURSE:   1. Shortness of breath with exertion and fluid overload. The patient was given IV fluids during the hospital course because of acute kidney injury. I needed to restart the patient's Bumex. Lungs upon discharge are clear. Patient off oxygen. 2. Uncontrolled diabetes mellitus. Hemoglobin A1c 12.8. The patient was taking 30 units twice a day of Lantus insulin. Here in the hospital the sugars have been good on once a day insulin. I will use 36 units of Lantus at night. I will add short acting insulin prior to meals. Can go back on the glipizide. With the sugars being good here in the hospital (135, 216, 255, 284, 199), this suggests noncompliance at home. I will set up home health to see if the patient is injecting her insulin correctly. She has the pens at home. Case discussed with daughter. Can follow up with endocrinology as outpatient. 3. Weakness. Physical therapy recommended home with home health. 4. Acute kidney disease on chronic kidney disease stage III. The patient was given IV fluids. I needed to stop that and restart Bumex secondary to fluid overload. 5. Hyponatremia this has resolved. Continue to hold Hydrocort thiazide. I do core thiazide is not a good medication for this patient. 6. Essential hypertension continue Bumex, lower dose dose losartan, lower dose metoprolol. 7. Atrial fibrillation.  Coumadin level now therapeutic. Heart rate controlled with low-dose metoprolol  DISCHARGE CONDITIONS:   Satisfactory   CONSULTS OBTAINED:  None   DRUG ALLERGIES:   Allergies  Allergen Reactions  . Pineapple Hives    DISCHARGE MEDICATIONS:   Discharge Medication List as of 05/06/2016 11:39 AM    START taking these medications   Details  insulin aspart (NOVOLOG) 100 UNIT/ML injection Inject 5 Units into the skin 3 (three) times daily with meals., Starting Thu 05/06/2016, No Print    losartan (COZAAR) 25 MG tablet Take 1 tablet (25 mg total) by mouth daily., Starting Thu 05/06/2016, Print      CONTINUE these medications which have CHANGED   Details  LANTUS SOLOSTAR 100 UNIT/ML Solostar Pen Inject 36 Units into the skin at bedtime., Starting Thu 05/06/2016, No Print    metoprolol (LOPRESSOR) 25 MG tablet Take 1 tablet (25 mg total) by mouth 2 (two) times daily., Starting Thu 05/06/2016, Print      CONTINUE these medications which have NOT CHANGED   Details  aspirin 81 MG EC tablet Take 1 tablet by mouth daily., Starting Mon 04/05/2016, Historical Med    bumetanide (BUMEX) 1 MG tablet Take 1 tablet by mouth daily. , Starting Fri 04/02/2016, Historical Med    Cyanocobalamin 2500 MCG CHEW Take 1 tablet by mouth daily., Historical Med    glipiZIDE (GLUCOTROL) 5 MG tablet Take 5 mg by mouth daily., Starting Mon 03/22/2016, Historical Med    Multiple Vitamins-Minerals (OCUVITE EYE + MULTI) TABS Take 1 tablet by mouth daily., Historical Med  multivitamin (ONE-A-DAY MEN'S) TABS tablet Take 1 tablet by mouth daily., Historical Med    potassium chloride SA (K-DUR,KLOR-CON) 20 MEQ tablet Take 10 tablets by mouth every other day. , Historical Med    warfarin (COUMADIN) 2.5 MG tablet Take 2.5 mg by mouth daily., Starting Fri 04/02/2016, Historical Med      STOP taking these medications     amLODipine (NORVASC) 5 MG tablet      losartan-hydrochlorothiazide (HYZAAR) 100-12.5 MG  tablet      cephALEXin (KEFLEX) 500 MG capsule          DISCHARGE INSTRUCTIONS:   Follow-up with Dr. Marcello FennelHande one week   home health set up  If you experience worsening of your admission symptoms, develop shortness of breath, life threatening emergency, suicidal or homicidal thoughts you must seek medical attention immediately by calling 911 or calling your MD immediately  if symptoms less severe.  You Must read complete instructions/literature along with all the possible adverse reactions/side effects for all the Medicines you take and that have been prescribed to you. Take any new Medicines after you have completely understood and accept all the possible adverse reactions/side effects.   Please note  You were cared for by a hospitalist during your hospital stay. If you have any questions about your discharge medications or the care you received while you were in the hospital after you are discharged, you can call the unit and asked to speak with the hospitalist on call if the hospitalist that took care of you is not available. Once you are discharged, your primary care physician will handle any further medical issues. Please note that NO REFILLS for any discharge medications will be authorized once you are discharged, as it is imperative that you return to your primary care physician (or establish a relationship with a primary care physician if you do not have one) for your aftercare needs so that they can reassess your need for medications and monitor your lab values.    Today   CHIEF COMPLAINT:   Chief Complaint  Patient presents with  . Hyperglycemia    HISTORY OF PRESENT ILLNESS:  [name] [name]  is a 82 y.o. female Was sent in for hyperglycemia   VITAL SIGNS:  Blood pressure 134/68, pulse 76, temperature 98 F (36.7 C), temperature source Oral, resp. rate 18, height 5' 2.5" (1.588 m), weight 85.2 kg (187 lb 14.4 oz), SpO2 94 %.   PHYSICAL EXAMINATION:  GENERAL:  82  y.o.-year-old patient lying in the bed with no acute distress.  EYES: Pupils equal, round, reactive to light and accommodation. No scleral icterus. Extraocular muscles intact.  HEENT: Head atraumatic, normocephalic. Oropharynx and nasopharynx clear.  NECK:  Supple, no jugular venous distention. No thyroid enlargement, no tenderness.  LUNGS: Normal breath sounds bilaterally, no wheezing, rales,rhonchi or crepitation. No use of accessory muscles of respiration.  CARDIOVASCULAR: S1, S2 normal. 3/6 systolic murmurs.  no rubs, or gallops.  ABDOMEN: Soft, non-tender, non-distended. Bowel sounds present. No organomegaly or mass.  EXTREMITIES: 2+  Edema.  no cyanosis, or clubbing.  NEUROLOGIC: Cranial nerves II through XII are intact. Muscle strength 5/5 in all extremities. Sensation intact. Gait not checked.  PSYCHIATRIC: The patient is alert and oriented x 3.  SKIN: No obvious rash, lesion, or ulcer.   DATA REVIEW:   CBC  Recent Labs Lab 05/04/16 2228  WBC 7.6  HGB 11.8*  HCT 34.8*  PLT 166    Chemistries   Recent Labs Lab 05/04/16 1310  05/05/16 0322  NA 122* 134*  K 4.4 4.7  CL 85* 94*  CO2 26 31  GLUCOSE 423* 192*  BUN 53* 47*  CREATININE 2.02* 1.72*  CALCIUM 8.6* 9.0  AST 31  --   ALT 22  --   ALKPHOS 114  --   BILITOT 0.9  --     Cardiac Enzymes  Recent Labs Lab 05/04/16 1534  TROPONINI 0.03*    Microbiology Results  Results for orders placed or performed during the hospital encounter of 05/04/16  Urine culture     Status: None   Collection Time: 05/04/16  3:23 PM  Result Value Ref Range Status   Specimen Description URINE, RANDOM  Final   Special Requests NONE  Final   [facility] GROWTH [facility]   Final   Report Status 05/05/2016 FINAL  Final  Blood culture (routine x 2)     Status: None (Preliminary result)   Collection Time: 05/04/16  3:34 PM  Result Value Ref Range Status   Specimen Description BLOOD L AC  Final   Special  Requests   Final    BOTTLES DRAWN AEROBIC AND ANAEROBIC AER 5ML ANA 3ML   Culture NO GROWTH 2 DAYS  Final   Report Status PENDING  Incomplete  Blood culture (routine x 2)     Status: None (Preliminary result)   Collection Time: 05/04/16  3:34 PM  Result Value Ref Range Status   Specimen Description BLOOD RT ARM  Final   Special Requests BOTTLES DRAWN AEROBIC AND ANAEROBIC 3CC  Final   Culture NO GROWTH 2 DAYS  Final   Report Status PENDING  Incomplete  MRSA PCR Screening     Status: None   Collection Time: 05/04/16 10:00 PM  Result Value Ref Range Status   MRSA by PCR NEGATIVE NEGATIVE Final    Comment:        The GeneXpert MRSA Assay (FDA approved for NASAL specimens only), is one component of a comprehensive MRSA colonization surveillance program. It is not intended to diagnose MRSA infection nor to guide or monitor treatment for MRSA infections.     RADIOLOGY:  Dg Chest 2 View  Result Date: 05/06/2016 CLINICAL DATA:  82 y/o  F; hypoxia and cough. EXAM: CHEST  2 VIEW COMPARISON:  None. FINDINGS: Widening of the left suprahilar mediastinum. Aortic atherosclerosis with arch calcification. Prominent epicardial fat pad. No focal consolidation. No pleural effusion or pneumothorax. Multilevel degenerative changes of the spine and dextrocurvature. IMPRESSION: No acute pulmonary process identified. Widening of left suprahilar mediastinum may represent adenopathy or vascular aneurysm. CT of the chest with contrast is recommended. These results will be called to the ordering clinician or representative by the Radiologist Assistant, and communication documented in the PACS or zVision Dashboard. Electronically Signed   By: Mitzi HansenLance  Furusawa-Stratton M.D.   On: 05/04/2016 16:47   Ct Chest Wo Contrast  Result Date: 05/04/2016 CLINICAL DATA:  Shortness of Breath EXAM: CT CHEST WITHOUT CONTRAST TECHNIQUE: Multidetector CT imaging of the chest was performed following the standard protocol without  IV contrast. COMPARISON:  Chest radiograph May 04, 2016 FINDINGS: Cardiovascular: There is no demonstrable thoracic aortic aneurysm. There is atherosclerotic calcification in the aorta. There are multiple foci of coronary artery calcification. Pericardium is not thickened. The visualized great vessels appear unremarkable except for mild calcification at the origins of the left and right common carotid arteries. There is marked dilatation of the main pulmonary outflow tract consistent with pulmonary  arterial hypertension. The main pulmonary outflow tract has a maximum measured diameter of 4.9 cm. Mediastinum/Nodes: The thyroid is mildly inhomogeneous in appearance without dominant mass evident. There are subcentimeter mediastinal lymph nodes at several sites. There is a lymph node just anterior to the distal trachea measuring 1.4 x 1.2 cm. There is a sub- carinal lymph node measuring 1.2 x 1.0 cm. Lungs/Pleura: There is mild scarring in each lung apex. There is also scarring in the posterior segment of the left upper lobe and in the anterior lung bases bilaterally. There is mild left base atelectasis. There is no appreciable edema or consolidation. Upper Abdomen: There is atherosclerotic calcification in the aorta and major mesenteric vessels. Of the liver has a somewhat lobular contour, raising question of a degree of underlying dx hepatic cirrhosis. Caudate lobe is also hypertrophied with a relatively small left lobe, findings that may be seen with hepatic cirrhosis. Musculoskeletal: No blastic or lytic bone lesions are evident. There is degenerative change in the thoracic spine. IMPRESSION: Areas of scarring bilaterally, primarily on the left. No edema or consolidation. Pulmonary arterial hypertension with marked enlargement of the main pulmonary outflow tract. There is fairly rapid peripheral tapering. Multiple foci of atherosclerotic calcification. Multiple foci of coronary artery calcification noted. There  are two mildly prominent mediastinal lymph nodes which may well be reactive in etiology given other findings. Contour of the liver is suggestive of hepatic cirrhosis. Appropriate laboratory correlation advised. Electronically Signed   By: Bretta Bang III M.D.   On: 05/04/2016 18:04    Management plans discussed with the patient, family and they are in agreement.  CODE STATUS:     Code Status Orders        Start     Ordered   05/04/16 2057  Do not attempt resuscitation (DNR)  Continuous    Question Answer Comment  In the event of cardiac or respiratory ARREST Do not call a "code blue"   In the event of cardiac or respiratory ARREST Do not perform Intubation, CPR, defibrillation or ACLS   In the event of cardiac or respiratory ARREST Use medication by any route, position, wound care, and other measures to relive pain and suffering. May use oxygen, suction and manual treatment of airway obstruction as needed for comfort.      05/04/16 2056    Code Status History    Date Active Date Inactive Code Status Order ID Comments User Context   This patient has a current code status but no historical code status.    Advance Directive Documentation   Flowsheet Row Most Recent Value  Type of Advance Directive  Healthcare Power of Attorney  Pre-existing out of facility DNR order (yellow form or pink MOST form)  No data  "MOST" Form in Place?  No data      TOTAL TIME TAKING CARE OF THIS PATIENT: 35  minutes.    Alford Highland M.D on 05/06/2016 at 3:09 PM  Between 7am to 6pm - Pager - [phone]  After 6pm go to [email or web address] - password [email or web address]  [employer] [facility] Office  [phone]  CC: Primary care physician; Barbette Reichmann, MD

## 2016-05-09 LAB — CULTURE, BLOOD (ROUTINE X 2)
CULTURE: NO GROWTH
Culture: NO GROWTH

## 2017-05-19 ENCOUNTER — Encounter: Payer: Medicare Other | Attending: Surgery | Admitting: Surgery

## 2017-05-19 DIAGNOSIS — E11622 Type 2 diabetes mellitus with other skin ulcer: Secondary | ICD-10-CM | POA: Insufficient documentation

## 2017-05-19 DIAGNOSIS — E1151 Type 2 diabetes mellitus with diabetic peripheral angiopathy without gangrene: Secondary | ICD-10-CM | POA: Diagnosis not present

## 2017-05-19 DIAGNOSIS — I5022 Chronic systolic (congestive) heart failure: Secondary | ICD-10-CM | POA: Insufficient documentation

## 2017-05-19 DIAGNOSIS — Z79899 Other long term (current) drug therapy: Secondary | ICD-10-CM | POA: Insufficient documentation

## 2017-05-19 DIAGNOSIS — Z7901 Long term (current) use of anticoagulants: Secondary | ICD-10-CM | POA: Insufficient documentation

## 2017-05-19 DIAGNOSIS — Z794 Long term (current) use of insulin: Secondary | ICD-10-CM | POA: Diagnosis not present

## 2017-05-19 DIAGNOSIS — N183 Chronic kidney disease, stage 3 (moderate): Secondary | ICD-10-CM | POA: Diagnosis not present

## 2017-05-19 DIAGNOSIS — I890 Lymphedema, not elsewhere classified: Secondary | ICD-10-CM | POA: Insufficient documentation

## 2017-05-19 DIAGNOSIS — H3530 Unspecified macular degeneration: Secondary | ICD-10-CM | POA: Insufficient documentation

## 2017-05-19 DIAGNOSIS — L97222 Non-pressure chronic ulcer of left calf with fat layer exposed: Secondary | ICD-10-CM | POA: Insufficient documentation

## 2017-05-19 DIAGNOSIS — I4891 Unspecified atrial fibrillation: Secondary | ICD-10-CM | POA: Diagnosis not present

## 2017-05-19 DIAGNOSIS — I130 Hypertensive heart and chronic kidney disease with heart failure and stage 1 through stage 4 chronic kidney disease, or unspecified chronic kidney disease: Secondary | ICD-10-CM | POA: Diagnosis not present

## 2017-05-19 DIAGNOSIS — Z87891 Personal history of nicotine dependence: Secondary | ICD-10-CM | POA: Diagnosis not present

## 2017-05-20 ENCOUNTER — Telehealth: Payer: Self-pay

## 2017-05-20 NOTE — Telephone Encounter (Signed)
SENT NOTES TO SCHEDULING 

## 2017-05-22 NOTE — Progress Notes (Signed)
[name] [name], [name] [name] ([id number]) Visit Report for 05/19/2017 Abuse/Suicide Risk Screen Details Patient Name: [name] [name], [name] [name] Date of Service: 05/19/2017 8:00 AM Medical Record Number: [id number] Patient Account Number: [id number] Date of Birth/Sex: Apr 15, 1926 (83 y.o. Female) Treating RN: Curtis Sitesorthy, Joanna Primary Care Ysenia Filice: Barbette ReichmannHande, Vishwanath Other Clinician: Referring Faust Thorington: Barbette ReichmannHande, Vishwanath Treating Devian Bartolomei/Extender: Rudene ReBritto, Errol Weeks in Treatment: 0 Abuse/Suicide Risk Screen Items Answer ABUSE/SUICIDE RISK SCREEN: Has anyone close to you tried to hurt or harm you recentlyo No Do you feel uncomfortable with anyone in your familyo No Has anyone forced you do things that you didnot want to doo No Do you have any thoughts of harming yourselfo No Patient displays signs or symptoms of abuse and/or neglect. No Electronic Signature(s) Signed: 05/20/2017 4:46:33 PM By: Curtis Sitesorthy, Joanna Entered By: Curtis Sitesorthy, Joanna on 05/19/2017 08:14:20 [name] [name], [name] [name] ([id number]) -------------------------------------------------------------------------------- Activities of Daily Living Details Patient Name: [name] [name], [name] [name] Date of Service: 05/19/2017 8:00 AM Medical Record Number: [id number] Patient Account Number: [id number] Date of Birth/Sex: Apr 15, 1926 (83 y.o. Female) Treating RN: Curtis Sitesorthy, Joanna Primary Care Reya Aurich: Barbette ReichmannHande, Vishwanath Other Clinician: Referring Yukari Flax: Barbette ReichmannHande, Vishwanath Treating Breean Nannini/Extender: Rudene ReBritto, Errol Weeks in Treatment: 0 Activities of Daily Living Items Answer Activities of Daily Living (Please select one for each item) Drive Automobile Not Able Take Medications Need Assistance Use Telephone Need Assistance Care for Appearance Need Assistance Use Toilet Need Assistance Bath / Shower Need Assistance Dress Self Need Assistance Feed Self Completely Able Walk Need Assistance Get In / Out Bed Need Assistance Housework Need Assistance Prepare Meals Need  Assistance Handle Money Not Able Shop for Self Need Assistance Electronic Signature(s) Signed: 05/20/2017 4:46:33 PM By: Curtis Sitesorthy, Joanna Entered By: Curtis Sitesorthy, Joanna on 05/19/2017 08:14:54 [name] [name], [name] [name] ([id number]) -------------------------------------------------------------------------------- Education Assessment Details Patient Name: [name] [name], [name] [name] Date of Service: 05/19/2017 8:00 AM Medical Record Number: [id number] Patient Account Number: [id number] Date of Birth/Sex: Apr 15, 1926 (83 y.o. Female) Treating RN: Curtis Sitesorthy, Joanna Primary Care [place] Ice: Barbette ReichmannHande, Vishwanath Other Clinician: Referring Fantasy Donald: Barbette ReichmannHande, Vishwanath Treating Brevan Luberto/Extender: Rudene ReBritto, Errol Weeks in Treatment: 0 Primary Learner Assessed: Caregiver Learning Preferences/Education Level/Primary Language Learning Preference: Explanation, Demonstration Highest Education Level: High School Preferred Language: English Cognitive Barrier Assessment/Beliefs Language Barrier: No Translator Needed: No Memory Deficit: No Emotional Barrier: No Cultural/Religious Beliefs Affecting Medical Care: No Physical Barrier Assessment Impaired Vision: No Impaired Hearing: No Decreased Hand dexterity: No Knowledge/Comprehension Assessment Knowledge Level: Medium Comprehension Level: Medium Ability to understand written Medium instructions: Ability to understand verbal Medium instructions: Motivation Assessment Anxiety Level: Calm Cooperation: Cooperative Education Importance: Acknowledges Need Interest in Health Problems: Asks Questions Perception: Coherent Willingness to Engage in Self- Medium Management Activities: Readiness to Engage in Self- Medium Management Activities: Electronic Signature(s) Signed: 05/20/2017 4:46:33 PM By: Curtis Sitesorthy, Joanna Entered By: Curtis Sitesorthy, Joanna on 05/19/2017 08:15:26 [name] [name], [name] [name] ([id number]) -------------------------------------------------------------------------------- Fall  Risk Assessment Details Patient Name: [name] [name], [name] [name] Date of Service: 05/19/2017 8:00 AM Medical Record Number: [id number] Patient Account Number: [id number] Date of Birth/Sex: Apr 15, 1926 (83 y.o. Female) Treating RN: Curtis Sitesorthy, Joanna Primary Care Renaud Celli: Barbette ReichmannHande, Vishwanath Other Clinician: Referring [place] Perra: Barbette ReichmannHande, Vishwanath Treating Laiyah Exline/Extender: Rudene ReBritto, Errol Weeks in Treatment: 0 Fall Risk Assessment Items Have you had 2 or more falls in the last 12 monthso 0 No Have you had any fall that resulted in injury in the last 12 monthso 0 No FALL RISK ASSESSMENT: History of falling - immediate or within 3 months 0 No Secondary diagnosis 0 No Ambulatory aid None/bed rest/wheelchair/nurse 0 No Crutches/cane/walker 15 Yes Furniture 0 No IV Access/Saline Lock 0 No Gait/Training Normal/bed rest/immobile 0 No Weak  10 Yes Impaired 0 No Mental Status Oriented to own ability 0 Yes Electronic Signature(s) Signed: 05/20/2017 4:46:33 PM By: Curtis Sitesorthy, Joanna Entered By: Curtis Sitesorthy, Joanna on 05/19/2017 08:15:42 [name] [name], [name] [name] ([id number]) -------------------------------------------------------------------------------- Nutrition Risk Assessment Details Patient Name: [name] [name], [name] [name] Date of Service: 05/19/2017 8:00 AM Medical Record Number: [id number] Patient Account Number: [id number] Date of Birth/Sex: 1926/06/01 (83 y.o. Female) Treating RN: Curtis Sitesorthy, Joanna Primary Care Leighanne Adolph: Barbette ReichmannHande, Vishwanath Other Clinician: Referring Sabri Teal: Barbette ReichmannHande, Vishwanath Treating Alvina Strother/Extender: Rudene ReBritto, Errol Weeks in Treatment: 0 Height (in): 63 Weight (lbs): 212 Body Mass Index (BMI): 37.6 Nutrition Risk Assessment Items NUTRITION RISK SCREEN: I have an illness or condition that made me change the kind and/or amount of 0 No food I eat I eat fewer than two meals per day 0 No I eat few fruits and vegetables, or milk products 0 No I have three or more drinks of beer, liquor or wine almost every day  0 No I have tooth or mouth problems that make it hard for me to eat 0 No I don't always have enough money to buy the food I need 0 No I eat alone most of the time 0 No I take three or more different prescribed or over-the-counter drugs a day 1 Yes Without wanting to, I have lost or gained 10 pounds in the last six months 0 No I am not always physically able to shop, cook and/or feed myself 0 No Nutrition Protocols Good Risk Protocol 0 No interventions needed Moderate Risk Protocol Electronic Signature(s) Signed: 05/20/2017 4:46:33 PM By: Curtis Sitesorthy, Joanna Entered By: Curtis Sitesorthy, Joanna on 05/19/2017 08:15:47

## 2017-05-22 NOTE — Progress Notes (Signed)
[name] [name], Shirl ([id number]) Visit Report for 05/19/2017 Allergy List Details Patient Name: [name] [name], [name] Date of Service: 05/19/2017 8:00 AM Medical Record Number: [id number] Patient Account Number: [id number] Date of Birth/Sex: [birth year] (83 y.o. Female) Treating RN: Curtis Sitesorthy, Joanna Primary Care Nijah Tejera: Barbette ReichmannHande, Vishwanath Other Clinician: Referring Shomari Matusik: Barbette ReichmannHande, Vishwanath Treating Khyrin Trevathan/Extender: Rudene ReBritto, Errol Weeks in Treatment: 0 Allergies Active Allergies No Known Allergies Allergy Notes Electronic Signature(s) Signed: 05/20/2017 4:46:33 PM By: Curtis Sitesorthy, Joanna Entered By: Curtis Sitesorthy, Joanna on 05/19/2017 08:14:11 [name] [name], Keelyn ([id number]) -------------------------------------------------------------------------------- Arrival Information Details Patient Name: [name] [name], [name] Date of Service: 05/19/2017 8:00 AM Medical Record Number: [id number] Patient Account Number: [id number] Date of Birth/Sex: [birth year] (83 y.o. Female) Treating RN: Curtis Sitesorthy, Joanna Primary Care Candie Gintz: Barbette ReichmannHande, Vishwanath Other Clinician: Referring Zamzam Whinery: Barbette ReichmannHande, Vishwanath Treating Alyda Megna/Extender: Rudene ReBritto, Errol Weeks in Treatment: 0 Visit Information Patient Arrived: Wheel Chair Arrival Time: 08:09 Accompanied By: dtr Transfer Assistance: Manual Patient Identification Verified: Yes Secondary Verification Process Yes Completed: Patient Has Alerts: Yes Patient Alerts: Patient on Blood Thinner DMII warfarin Electronic Signature(s) Signed: 05/20/2017 4:46:33 PM By: Curtis Sitesorthy, Joanna Entered By: Curtis Sitesorthy, Joanna on 05/19/2017 08:11:32 [name] [name], [place] ([id number]) -------------------------------------------------------------------------------- Clinic Level of Care Assessment Details Patient Name: [name] [name], [name] Date of Service: 05/19/2017 8:00 AM Medical Record Number: [id number] Patient Account Number: [id number] Date of Birth/Sex: [birth year] (83 y.o. Female) Treating RN: Curtis Sitesorthy,  Joanna Primary Care Jamaria Amborn: Barbette ReichmannHande, Vishwanath Other Clinician: Referring Honesti Seaberg: Barbette ReichmannHande, Vishwanath Treating Venisha Boehning/Extender: Rudene ReBritto, Errol Weeks in Treatment: 0 Clinic Level of Care Assessment Items TOOL 4 Quantity Score []  - Use when only an EandM is performed on FOLLOW-UP visit 0 ASSESSMENTS - Nursing Assessment / Reassessment X - Reassessment of Co-morbidities (includes updates in patient status) 1 10 X- 1 5 Reassessment of Adherence to Treatment Plan ASSESSMENTS - Wound and Skin Assessment / Reassessment X - Simple Wound Assessment / Reassessment - one wound 1 5 []  - 0 Complex Wound Assessment / Reassessment - multiple wounds []  - 0 Dermatologic / Skin Assessment (not related to wound area) ASSESSMENTS - Focused Assessment []  - Circumferential Edema Measurements - multi extremities 0 []  - 0 Nutritional Assessment / Counseling / Intervention X- 1 5 Lower Extremity Assessment (monofilament, tuning fork, pulses) []  - 0 Peripheral Arterial Disease Assessment (using hand held doppler) ASSESSMENTS - Ostomy and/or Continence Assessment and Care []  - Incontinence Assessment and Management 0 []  - 0 Ostomy Care Assessment and Management (repouching, etc.) PROCESS - Coordination of Care X - Simple Patient / Family Education for ongoing care 1 15 []  - 0 Complex (extensive) Patient / Family Education for ongoing care X- 1 10 Staff obtains ChiropractorConsents, Records, Test Results / Process Orders []  - 0 Staff telephones HHA, Nursing Homes / Clarify orders / etc []  - 0 Routine Transfer to another Facility (non-emergent condition) []  - 0 Routine Hospital Admission (non-emergent condition) X- 1 15 New Admissions / Manufacturing engineernsurance Authorizations / Ordering NPWT, Apligraf, etc. []  - 0 Emergency Hospital Admission (emergent condition) X- 1 10 Simple Discharge Coordination Tkach, Shantice ([id number]) []  - 0 Complex (extensive) Discharge Coordination PROCESS - Special Needs []  - Pediatric /  Minor Patient Management 0 []  - 0 Isolation Patient Management []  - 0 Hearing / Language / Visual special needs []  - 0 Assessment of Community assistance (transportation, D/C planning, etc.) []  - 0 Additional assistance / Altered mentation []  - 0 Support Surface(s) Assessment (bed, cushion, seat, etc.) INTERVENTIONS - Wound Cleansing / Measurement X - Simple Wound Cleansing - one wound 1 5 []  - 0 Complex Wound Cleansing - multiple  wounds X- 1 5 Wound Imaging (photographs - any number of wounds) []  - 0 Wound Tracing (instead of photographs) X- 1 5 Simple Wound Measurement - one wound []  - 0 Complex Wound Measurement - multiple wounds INTERVENTIONS - Wound Dressings []  - Small Wound Dressing one or multiple wounds 0 []  - 0 Medium Wound Dressing one or multiple wounds X- 1 20 Large Wound Dressing one or multiple wounds []  - 0 Application of Medications - topical []  - 0 Application of Medications - injection INTERVENTIONS - Miscellaneous []  - External ear exam 0 []  - 0 Specimen Collection (cultures, biopsies, blood, body fluids, etc.) []  - 0 Specimen(s) / Culture(s) sent or taken to Lab for analysis []  - 0 Patient Transfer (multiple staff / Nurse, adult / Similar devices) []  - 0 Simple Staple / Suture removal (25 or less) []  - 0 Complex Staple / Suture removal (26 or more) []  - 0 Hypo / Hyperglycemic Management (close monitor of Blood Glucose) []  - 0 Ankle / Brachial Index (ABI) - do not check if billed separately X- 1 5 Vital Signs Utz, Jonnelle ([id number]) Has the patient been seen at the hospital within the last three years: Yes Total Score: 115 Level Of Care: New/Established - Level 3 Electronic Signature(s) Signed: 05/20/2017 4:46:33 PM By: Curtis Sites Entered By: Curtis Sites on 05/19/2017 09:53:24 [name] Bud ([id number]) -------------------------------------------------------------------------------- Encounter Discharge Information  Details Patient Name: [name] Bud Date of Service: 05/19/2017 8:00 AM Medical Record Number: [id number] Patient Account Number: [id number] Date of Birth/Sex: 20-Aug-1925 (83 y.o. Female) Treating RN: Curtis Sites Primary Care Paul Torpey: Barbette Reichmann Other Clinician: Referring Robben Jagiello: Barbette Reichmann Treating Kamar Callender/Extender: Rudene Re in Treatment: 0 Encounter Discharge Information Items Discharge Pain Level: 0 Discharge Condition: Stable Ambulatory Status: Cane Discharge Destination: Home Private Transportation: Auto Accompanied By: dtr Schedule Follow-up Appointment: Yes Medication Reconciliation completed and provided No to Patient/Care Griselle Rufer: Clinical Summary of Care: Electronic Signature(s) Signed: 05/20/2017 4:46:33 PM By: Curtis Sites Entered By: Curtis Sites on 05/19/2017 09:55:37 [name] Bud ([id number]) -------------------------------------------------------------------------------- Lower Extremity Assessment Details Patient Name: [name] Bud Date of Service: 05/19/2017 8:00 AM Medical Record Number: [id number] Patient Account Number: [id number] Date of Birth/Sex: [birth year] (83 y.o. Female) Treating RN: Curtis Sites Primary Care Nelsy Madonna: Barbette Reichmann Other Clinician: Referring Evellyn Tuff: Barbette Reichmann Treating Margareth Kanner/Extender: Rudene Re in Treatment: 0 Edema Assessment Assessed: [Left: No] [Right: No] Edema: [Left: Yes] [Right: Yes] Calf Left: Right: Point of Measurement: 32 cm From Medial Instep 43.2 cm 41.5 cm Ankle Left: Right: Point of Measurement: 9 cm From Medial Instep 28 cm 25 cm Vascular Assessment Pulses: Dorsalis Pedis Palpable: [Left:No] [Right:No] Doppler Audible: [Left:Inaudible] [Right:Inaudible] Posterior Tibial Palpable: [Left:No] [Right:No] Doppler Audible: [Left:Inaudible] [Right:Inaudible] Extremity colors, hair growth, and conditions: Extremity Color:  [Left:Hyperpigmented] [Right:Hyperpigmented] Hair Growth on Extremity: [Left:No] [Right:No] Temperature of Extremity: [Left:Cool] [Right:Cool] Capillary Refill: [Left:< 3 seconds] [Right:< 3 seconds] Toe Nail Assessment Left: Right: Thick: Yes Yes Discolored: Yes Yes Deformed: No No Improper Length and Hygiene: Yes Yes Notes pulses were inaudible - patient has 4+ pitting edema Electronic Signature(s) Signed: 05/20/2017 4:46:33 PM By: Curtis Sites Entered By: Curtis Sites on 05/19/2017 08:43:09 [name] Bud ([id number]) -------------------------------------------------------------------------------- Multi Wound Chart Details Patient Name: [name] Bud Date of Service: 05/19/2017 8:00 AM Medical Record Number: [id number] Patient Account Number: [id number] Date of Birth/Sex: Sep 17, 1925 (83 y.o. Female) Treating RN: Curtis Sites Primary Care Lyndzee Kliebert: Barbette Reichmann Other Clinician: Referring Jules Vidovich: Barbette Reichmann Treating Marcio Hoque/Extender: Rudene Re in Treatment: 0 Vital Signs Height(in): 63 Pulse(bpm):  61 Weight(lbs): 212 Blood Pressure(mmHg): 99/57 Body Mass Index(BMI): 38 Temperature(F): 97.6 Respiratory Rate 20 (breaths/min): Photos: [1:No Photos] [N/A:N/A] Wound Location: [1:Left Lower Leg - Posterior] [N/A:N/A] Wounding Event: [1:Blister] [N/A:N/A] Primary Etiology: [1:Diabetic Wound/Ulcer of the Lower Extremity] [N/A:N/A] Secondary Etiology: [1:Lymphedema] [N/A:N/A] Comorbid History: [1:Cataracts, Anemia, Arrhythmia, Congestive Heart Failure, Hypertension, Type II Diabetes] [N/A:N/A] Date Acquired: [1:12/28/2016] [N/A:N/A] Weeks of Treatment: [1:0] [N/A:N/A] Wound Status: [1:Open] [N/A:N/A] Measurements L x W x D [1:0.6x0.5x0.1] [N/A:N/A] (cm) Area (cm) : [1:0.236] [N/A:N/A] Volume (cm) : [1:0.024] [N/A:N/A] Classification: [1:Grade 1] [N/A:N/A] Exudate Amount: [1:Large] [N/A:N/A] Exudate Type: [1:Serous] [N/A:N/A] Exudate  Color: [1:amber] [N/A:N/A] Wound Margin: [1:Flat and Intact] [N/A:N/A] Granulation Amount: [1:None Present (0%)] [N/A:N/A] Necrotic Amount: [1:Large (67-100%)] [N/A:N/A] Necrotic Tissue: [1:Eschar, Adherent Slough] [N/A:N/A] Exposed Structures: [1:Fascia: No Fat Layer (Subcutaneous Tissue) Exposed: No Tendon: No Muscle: No Joint: No Bone: No] [N/A:N/A] Epithelialization: [1:None] [N/A:N/A] Periwound Skin Texture: [1:Excoriation: No Induration: No Callus: No Crepitus: No] [N/A:N/A] Rash: No Scarring: No Periwound Skin Moisture: Maceration: No N/A N/A Dry/Scaly: No Periwound Skin Color: Atrophie Blanche: No N/A N/A Cyanosis: No Ecchymosis: No Erythema: No Hemosiderin Staining: No Mottled: No Pallor: No Rubor: No Temperature: No Abnormality N/A N/A Tenderness on Palpation: Yes N/A N/A Wound Preparation: Ulcer Cleansing: N/A N/A Rinsed/Irrigated with Saline, Other: soap and water Topical Anesthetic Applied: Other: lidocaine 4% Treatment Notes Wound #1 (Left, Posterior Lower Leg) 1. Cleansed with: Clean wound with Normal Saline 2. Anesthetic Topical Lidocaine 4% cream to wound bed prior to debridement 4. Dressing Applied: Other dressing (specify in notes) 5. Secondary Dressing Applied ABD Pad 7. Secured with Tape Other (specify in notes) Notes silvercel, xtrasorb, kerlix and coban wrap, unna to Ecologistanchor Electronic Signature(s) Signed: 05/19/2017 10:18:10 AM By: Evlyn KannerBritto, Errol MD, FACS Entered By: Evlyn KannerBritto, Errol on 05/19/2017 10:18:10 [name] [name], Tianne ([id number]) -------------------------------------------------------------------------------- Multi-Disciplinary Care Plan Details Patient Name: [name] [name], Nilani Date of Service: 05/19/2017 8:00 AM Medical Record Number: [id number] Patient Account Number: [id number] Date of Birth/Sex: May 28, 1926 (83 y.o. Female) Treating RN: Curtis Sitesorthy, Joanna Primary Care Kaimana Lurz: Barbette ReichmannHande, Vishwanath Other Clinician: Referring Lynkin Saini: Barbette ReichmannHande,  Vishwanath Treating Ronna Herskowitz/Extender: Rudene ReBritto, Errol Weeks in Treatment: 0 Active Inactive ` Abuse / Safety / Falls / Self Care Management Nursing Diagnoses: Impaired physical mobility Goals: Patient will not experience any injury related to falls Date Initiated: 05/19/2017 Target Resolution Date: 07/30/2017 Goal Status: Active Interventions: Assess fall risk on admission and as needed Notes: ` Orientation to the Wound Care Program Nursing Diagnoses: Knowledge deficit related to the wound healing center program Goals: Patient/caregiver will verbalize understanding of the Wound Healing Center Program Date Initiated: 05/19/2017 Target Resolution Date: 07/30/2017 Goal Status: Active Interventions: Provide education on orientation to the wound center Notes: ` Wound/Skin Impairment Nursing Diagnoses: Impaired tissue integrity Goals: Ulcer/skin breakdown will heal within 14 weeks Date Initiated: 05/19/2017 Target Resolution Date: 07/30/2017 Goal Status: Active Interventions: [name] [name], Dot ([id number]) Assess patient/caregiver ability to obtain necessary supplies Assess patient/caregiver ability to perform ulcer/skin care regimen upon admission and as needed Assess ulceration(s) every visit Notes: Electronic Signature(s) Signed: 05/20/2017 4:46:33 PM By: Curtis Sitesorthy, Joanna Entered By: Curtis Sitesorthy, Joanna on 05/19/2017 09:00:59 [name] [name], Nakya ([id number]) -------------------------------------------------------------------------------- Pain Assessment Details Patient Name: [name] [name], Kayal Date of Service: 05/19/2017 8:00 AM Medical Record Number: [id number] Patient Account Number: [id number] Date of Birth/Sex: May 28, 1926 (83 y.o. Female) Treating RN: Curtis Sitesorthy, Joanna Primary Care Neli Fofana: Barbette ReichmannHande, Vishwanath Other Clinician: Referring Kamsiyochukwu Buist: Barbette ReichmannHande, Vishwanath Treating Bandon Sherwin/Extender: Rudene ReBritto, Errol Weeks in Treatment: 0 Active Problems Location of Pain Severity and Description  of Pain Patient Has  Paino No Site Locations With Dressing Change: Yes Pain Management and Medication Current Pain Management: Notes Topical or injectable lidocaine is offered to patient for acute pain when surgical debridement is performed. If needed, Patient is instructed to use over the counter pain medication for the following 24-48 hours after debridement. Wound care MDs do not prescribed pain medications. Patient has chronic pain or uncontrolled pain. Patient has been instructed to make an appointment with their Primary Care Physician for pain management. Electronic Signature(s) Signed: 05/20/2017 4:46:33 PM By: Curtis Sites Entered By: Curtis Sites on 05/19/2017 08:11:50 [name] Bud ([id number]) -------------------------------------------------------------------------------- Patient/Caregiver Education Details Patient Name: [name] Bud Date of Service: 05/19/2017 8:00 AM Medical Record Number: [id number] Patient Account Number: [id number] Date of Birth/Gender: [birth year] (83 y.o. Female) Treating RN: Curtis Sites Primary Care Physician: Barbette Reichmann Other Clinician: Referring Physician: Barbette Reichmann Treating Physician/Extender: Rudene Re in Treatment: 0 Education Assessment Education Provided To: Patient Education Topics Provided Venous: Handouts: Other: leg elevation Methods: Explain/Verbal Responses: State content correctly Electronic Signature(s) Signed: 05/20/2017 4:46:33 PM By: Curtis Sites Entered By: Curtis Sites on 05/19/2017 09:55:59 [name] Bud ([id number]) -------------------------------------------------------------------------------- Wound Assessment Details Patient Name: [name] Bud Date of Service: 05/19/2017 8:00 AM Medical Record Number: [id number] Patient Account Number: [id number] Date of Birth/Sex: 12/03/1925 (83 y.o. Female) Treating RN: Curtis Sites Primary Care Prestyn Mahn: Barbette Reichmann Other  Clinician: Referring Destiney Sanabia: Barbette Reichmann Treating Ivionna Verley/Extender: Rudene Re in Treatment: 0 Wound Status Wound Number: 1 Primary Diabetic Wound/Ulcer of the Lower Extremity Etiology: Wound Location: Left Lower Leg - Posterior Secondary Lymphedema Wounding Event: Blister Etiology: Date Acquired: 12/28/2016 Wound Status: Open Weeks Of Treatment: 0 Comorbid Cataracts, Anemia, Arrhythmia, Congestive Clustered Wound: No History: Heart Failure, Hypertension, Type II Diabetes Photos Photo Uploaded By: Elliot Gurney, BSN, RN, CWS, Kim on 05/20/2017 12:51:22 Wound Measurements Length: (cm) 0.6 Width: (cm) 0.5 Depth: (cm) 0.1 Area: (cm) 0.236 Volume: (cm) 0.024 % Reduction in Area: % Reduction in Volume: Epithelialization: None Tunneling: No Undermining: No Wound Description Classification: Grade 1 Foul Odor Wound Margin: Flat and Intact Slough/Fi Exudate Amount: Large Exudate Type: Serous Exudate Color: amber After Cleansing: No brino Yes Wound Bed Granulation Amount: None Present (0%) Exposed Structure Necrotic Amount: Large (67-100%) Fascia Exposed: No Necrotic Quality: Eschar, Adherent Slough Fat Layer (Subcutaneous Tissue) Exposed: No Tendon Exposed: No Muscle Exposed: No Joint Exposed: No Bone Exposed: No Periwound Skin Texture Bluett, Avynn ([id number]) Texture Color No Abnormalities Noted: No No Abnormalities Noted: No Callus: No Atrophie Blanche: No Crepitus: No Cyanosis: No Excoriation: No Ecchymosis: No Induration: No Erythema: No Rash: No Hemosiderin Staining: No Scarring: No Mottled: No Pallor: No Moisture Rubor: No No Abnormalities Noted: No Dry / Scaly: No Temperature / Pain Maceration: No Temperature: No Abnormality Tenderness on Palpation: Yes Wound Preparation Ulcer Cleansing: Rinsed/Irrigated with Saline, Other: soap and water, Topical Anesthetic Applied: Other: lidocaine 4%, Treatment Notes Wound #1 (Left,  Posterior Lower Leg) 1. Cleansed with: Clean wound with Normal Saline 2. Anesthetic Topical Lidocaine 4% cream to wound bed prior to debridement 4. Dressing Applied: Other dressing (specify in notes) 5. Secondary Dressing Applied ABD Pad 7. Secured with Tape Other (specify in notes) Notes silvercel, xtrasorb, kerlix and coban wrap, unna to Ecologist) Signed: 05/20/2017 4:46:33 PM By: Curtis Sites Entered By: Curtis Sites on 05/19/2017 08:35:29 [name] Bud ([id number]) -------------------------------------------------------------------------------- Vitals Details Patient Name: [name] Bud Date of Service: 05/19/2017 8:00 AM Medical Record Number: [id number] Patient Account Number: [id number] Date of Birth/Sex: 06/18/1926 (83 y.o. Female) Treating RN: Dorthy,  Mardene Celeste Primary Care Keinan Brouillet: Barbette Reichmann Other Clinician: Referring Waylin Dorko: Barbette Reichmann Treating Isaly Fasching/Extender: Rudene Re in Treatment: 0 Vital Signs Time Taken: 08:11 Temperature (F): 97.6 Height (in): 63 Pulse (bpm): 61 Source: Measured Respiratory Rate (breaths/min): 20 Weight (lbs): 212 Blood Pressure (mmHg): 99/57 Source: Measured Reference Range: 80 - 120 mg / dl Body Mass Index (BMI): 37.6 Electronic Signature(s) Signed: 05/20/2017 4:46:33 PM By: Curtis Sites Entered By: Curtis Sites on 05/19/2017 08:13:33

## 2017-05-22 NOTE — Progress Notes (Signed)
[name] [name], [name] [name] ([id number]) Visit Report for 05/19/2017 Chief Complaint Document Details Patient Name: [name] [name], [name] [name] Date of Service: 05/19/2017 8:00 AM Medical Record Number: [id number] Patient Account Number: [id number] Date of Birth/Sex: Mar 04, 1926 (83 y.o. Female) Treating RN: Curtis Sites Primary Care Provider: Barbette Reichmann Other Clinician: Referring Provider: Barbette Reichmann Treating Provider/Extender: Rudene Re in Treatment: 0 Information Obtained from: Patient Chief Complaint Patient presents to the wound care center for a consult due non healing wound to the left lower extremity with bilateral massive swelling of the legs which she's had for about 4 months Electronic Signature(s) Signed: 05/19/2017 10:18:40 AM By: Evlyn Kanner MD, FACS Entered By: Evlyn Kanner on 05/19/2017 10:18:40 [name] [name] ([id number]) -------------------------------------------------------------------------------- HPI Details Patient Name: [name] [name] Date of Service: 05/19/2017 8:00 AM Medical Record Number: [id number] Patient Account Number: [id number] Date of Birth/Sex: 11-08-1925 (83 y.o. Female) Treating RN: Curtis Sites Primary Care Provider: Barbette Reichmann Other Clinician: Referring Provider: Barbette Reichmann Treating Provider/Extender: Rudene Re in Treatment: 0 History of Present Illness Location: bilateral swelling of the legs associated with ulceration on the left lower extremity Quality: Patient reports experiencing a dull pain to affected area(s). Severity: Patient states wound are getting worse. Duration: Patient has had the wound for > 3 months prior to seeking treatment at the wound center Timing: Pain in wound is Intermittent (comes and goes Context: The wound would happen gradually Modifying Factors: Other treatment(s) tried include:local care as per the PCP Associated Signs and Symptoms: Patient reports having increase swelling. HPI  Description: 83 year old patient seen by her PCP and referred to our center for left lower extremity stasis ulceration. She is known to have diabetes mellitus type 2, hypertension,status post cholecystectomy and open heart surgery, and atrial fibrillation and has bilateral lower extremity lymphedema with oozing of fluid. Her most recent hemoglobin A1c was 7.2. after assessment by the PCP she was put on torsemide 20 mg daily, echo was noted to have an ejection fraction of more than 55% with moderate MR, and the patient was maintained on warfarin for atrial fibrillation. the patient has been living in [place] for about a year but has been traveling a bit and has not had any arterial or venous studies done recently. Her last echo was about a year and a half ago Psychologist, prison and probation services) Signed: 05/19/2017 10:19:38 AM By: Evlyn Kanner MD, FACS Previous Signature: 05/19/2017 8:38:11 AM Version By: Evlyn Kanner MD, FACS Entered By: Evlyn Kanner on 05/19/2017 10:19:38 [name] [name] ([id number]) -------------------------------------------------------------------------------- Physical Exam Details Patient Name: [name] [name] Date of Service: 05/19/2017 8:00 AM Medical Record Number: [id number] Patient Account Number: [id number] Date of Birth/Sex: Jul 03, 1925 (83 y.o. Female) Treating RN: Curtis Sites Primary Care Provider: Barbette Reichmann Other Clinician: Referring Provider: Barbette Reichmann Treating Provider/Extender: Rudene Re in Treatment: 0 Constitutional . Pulse regular. Respirations normal and unlabored. Afebrile. . Eyes Nonicteric. Reactive to light. Ears, Nose, Mouth, and Throat Lips, teeth, and gums WNL.Marland Kitchen Moist mucosa without lesions. Neck supple and nontender. No palpable supraclavicular or cervical adenopathy. Normal sized without goiter. Respiratory WNL. No retractions.. Breath sounds WNL, No rubs, rales, rhonchi, or wheeze.. Cardiovascular Pedal Pulses  WNL. ABIs were noncompressible. bilateral stage II lymphedema. Gastrointestinal (GI) Abdomen without masses or tenderness.. No liver or spleen enlargement or tenderness.. Lymphatic No adneopathy. No adenopathy. No adenopathy. Musculoskeletal Adexa without tenderness or enlargement.. Digits and nails w/o clubbing, cyanosis, infection, petechiae, ischemia, or inflammatory conditions.. Integumentary (Hair, Skin) No suspicious lesions. No crepitus or fluctuance. No peri-wound warmth or erythema. No masses.Marland Kitchen  Psychiatric Judgement and insight Intact.. No evidence of depression, anxiety, or agitation.. Notes the patient has massive bilateral stage II lymphedema with an ulceration on the left lower extremity which is oozing fluid. There is no cellulitis. No sharp debridement was required today. Electronic Signature(s) Signed: 05/19/2017 10:20:28 AM By: Evlyn Kanner MD, FACS Entered By: Evlyn Kanner on 05/19/2017 10:20:28 [name] [name], [name] [name] ([id number]) -------------------------------------------------------------------------------- Physician Orders Details Patient Name: [name] [name] Date of Service: 05/19/2017 8:00 AM Medical Record Number: [id number] Patient Account Number: [id number] Date of Birth/Sex: February 10, 1926 (83 y.o. Female) Treating RN: Curtis Sites Primary Care Provider: Barbette Reichmann Other Clinician: Referring Provider: Barbette Reichmann Treating Provider/Extender: Rudene Re in Treatment: 0 Verbal / Phone Orders: No Diagnosis Coding Wound Cleansing Wound #1 Left,Posterior Lower Leg o Clean wound with Normal Saline. o May Shower, gently pat wound dry prior to applying new dressing. Anesthetic Wound #1 Left,Posterior Lower Leg o Topical Lidocaine 4% cream applied to wound bed prior to debridement Primary Wound Dressing Wound #1 Left,Posterior Lower Leg o Silvercel Non-Adherent Secondary Dressing Wound #1 Left,Posterior Lower Leg o ABD pad o  XtraSorb - if needed for drainage Dressing Change Frequency Wound #1 Left,Posterior Lower Leg o Change Dressing Monday, Wednesday, Friday Follow-up Appointments Wound #1 Left,Posterior Lower Leg o Return Appointment in 1 week. Edema Control Wound #1 Left,Posterior Lower Leg o Kerlix and Coban - Left Lower Extremity - lightly from toes to 3cm below the knee - may anchor with unna wrap if needed Additional Orders / Instructions Wound #1 Left,Posterior Lower Leg o Increase protein intake. o Other: - Please try to keep blood sugars below 180. Please add over the counter vitamin C, multivitamin and zinc supplements to your diet. Home Health Wound #1 Left,Posterior Lower Leg o Initiate Home Health for Skilled Nursing o Home Health Nurse may visit PRN to address patientos wound care needs. [name] [name], [name] [name] ([id number]) o FACE TO FACE ENCOUNTER: MEDICARE and MEDICAID PATIENTS: I certify that this patient is under my care and that I had a face-to-face encounter that meets the physician face-to-face encounter requirements with this patient on this date. The encounter with the patient was in whole or in part for the following MEDICAL CONDITION: (primary reason for Home Healthcare) MEDICAL NECESSITY: I certify, that based on my findings, NURSING services are a medically necessary home health service. HOME BOUND STATUS: I certify that my clinical findings support that this patient is homebound (i.e., Due to illness or injury, pt requires aid of supportive devices such as crutches, cane, wheelchairs, walkers, the use of special transportation or the assistance of another person to leave their place of residence. There is a normal inability to leave the home and doing so requires considerable and taxing effort. Other absences are for medical reasons / religious services and are infrequent or of short duration when for other reasons). o If current dressing causes regression in wound  condition, may D/C ordered dressing product/s and apply Normal Saline Moist Dressing daily until next Wound Healing Center / Other MD appointment. Notify Wound Healing Center of regression in wound condition at [phone]. o Please direct any NON-WOUND related issues/requests for orders to patient's Primary Care Physician Services and Therapies o Arterial Studies- Bilateral Electronic Signature(s) Signed: 05/19/2017 4:36:05 PM By: Evlyn Kanner MD, FACS Signed: 05/20/2017 4:46:33 PM By: Curtis Sites Entered By: Curtis Sites on 05/19/2017 09:04:19 [name] [name] ([id number]) -------------------------------------------------------------------------------- Problem List Details Patient Name: [name] [name] Date of Service: 05/19/2017 8:00 AM Medical Record Number: [id number] Patient Account Number: [id number] Date  of Birth/Sex: 20-Jul-1925 (83 y.o. Female) Treating RN: Curtis Sites Primary Care Provider: Barbette Reichmann Other Clinician: Referring Provider: Barbette Reichmann Treating Provider/Extender: Rudene Re in Treatment: 0 Active Problems ICD-10 Encounter Code Description Active Date Diagnosis E11.622 Type 2 diabetes mellitus with other skin ulcer 05/19/2017 Yes I89.0 Lymphedema, not elsewhere classified 05/19/2017 Yes L97.222 Non-pressure chronic ulcer of left calf with fat layer exposed 05/19/2017 Yes I50.22 Chronic systolic (congestive) heart failure 05/19/2017 Yes N18.3 Chronic kidney disease, stage 3 (moderate) 05/19/2017 Yes Inactive Problems Resolved Problems Electronic Signature(s) Signed: 05/19/2017 10:18:05 AM By: Evlyn Kanner MD, FACS Entered By: Evlyn Kanner on 05/19/2017 10:18:05 [name] [name] ([id number]) -------------------------------------------------------------------------------- Progress Note Details Patient Name: [name] [name] Date of Service: 05/19/2017 8:00 AM Medical Record Number: [id number] Patient Account Number:  [id number] Date of Birth/Sex: 1926-03-09 (83 y.o. Female) Treating RN: Curtis Sites Primary Care Provider: Barbette Reichmann Other Clinician: Referring Provider: Barbette Reichmann Treating Provider/Extender: Rudene Re in Treatment: 0 Subjective Chief Complaint Information obtained from Patient Patient presents to the wound care center for a consult due non healing wound to the left lower extremity with bilateral massive swelling of the legs which she's had for about 4 months History of Present Illness (HPI) The following HPI elements were documented for the patient's wound: Location: bilateral swelling of the legs associated with ulceration on the left lower extremity Quality: Patient reports experiencing a dull pain to affected area(s). Severity: Patient states wound are getting worse. Duration: Patient has had the wound for > 3 months prior to seeking treatment at the wound center Timing: Pain in wound is Intermittent (comes and goes Context: The wound would happen gradually Modifying Factors: Other treatment(s) tried include:local care as per the PCP Associated Signs and Symptoms: Patient reports having increase swelling. 83 year old patient seen by her PCP and referred to our center for left lower extremity stasis ulceration. She is known to have diabetes mellitus type 2, hypertension,status post cholecystectomy and open heart surgery, and atrial fibrillation and has bilateral lower extremity lymphedema with oozing of fluid. Her most recent hemoglobin A1c was 7.2. after assessment by the PCP she was put on torsemide 20 mg daily, echo was noted to have an ejection fraction of more than 55% with moderate MR, and the patient was maintained on warfarin for atrial fibrillation. the patient has been living in [place] for about a year but has been traveling a bit and has not had any arterial or venous studies done recently. Her last echo was about a year and a half  ago Wound History Patient presents with 2 open wounds that have been present for approximately 5 months. Patient has been treating wounds in the following manner: camphil. Laboratory tests have not been performed in the last month. Patient reportedly has not tested positive for an antibiotic resistant organism. Patient reportedly has not tested positive for osteomyelitis. Patient reportedly has not had testing performed to evaluate circulation in the legs. Patient experiences the following problems associated with their wounds: swelling. Patient History Information obtained from Caregiver. Allergies No Known Allergies Family History Cancer - Child, Heart Disease - Mother, No family history of Diabetes, Hereditary Spherocytosis, Hypertension, Kidney Disease, Lung Disease, Seizures, Stroke, Thyroid Problems, Tuberculosis. Social History [name] [name], [name] [name] ([id number]) Former smoker - quit 60 years ago, Marital Status - Widowed, Alcohol Use - Never, Drug Use - No History, Caffeine Use - Daily. Medical History Eyes Patient has history of Cataracts - removed Hematologic/Lymphatic Patient has history of Anemia Denies history of Hemophilia, Human Immunodeficiency  Virus, Lymphedema, Sickle Cell Disease Respiratory Denies history of Aspiration, Asthma, Chronic Obstructive Pulmonary Disease (COPD), Pneumothorax, Sleep Apnea, Tuberculosis Cardiovascular Patient has history of Arrhythmia - a fib, Congestive Heart Failure, Hypertension Denies history of Angina, Coronary Artery Disease, Deep Vein Thrombosis, Hypotension, Myocardial Infarction, Peripheral Arterial Disease, Peripheral Venous Disease, Phlebitis, Vasculitis Gastrointestinal Denies history of Cirrhosis , Colitis, Crohn s, Hepatitis A, Hepatitis B, Hepatitis C Endocrine Patient has history of Type II Diabetes Immunological Denies history of Lupus Erythematosus, Raynaud s, Scleroderma Musculoskeletal Denies history of Gout, Rheumatoid  Arthritis, Osteoarthritis, Osteomyelitis Neurologic Denies history of Dementia, Neuropathy Oncologic Denies history of Received Chemotherapy, Received Radiation Patient is treated with Insulin. Medical And Surgical History Notes Eyes macular degeneration Review of Systems (ROS) Constitutional Symptoms (General Health) The patient has no complaints or symptoms. Eyes The patient has no complaints or symptoms. Ear/Nose/Mouth/Throat The patient has no complaints or symptoms. Hematologic/Lymphatic The patient has no complaints or symptoms. Respiratory The patient has no complaints or symptoms. Cardiovascular Complains or has symptoms of LE edema. Gastrointestinal The patient has no complaints or symptoms. Genitourinary Complains or has symptoms of Kidney failure/ Dialysis - CKD stage 3. Immunological The patient has no complaints or symptoms. Integumentary (Skin) The patient has no complaints or symptoms. Musculoskeletal The patient has no complaints or symptoms. Neurologic The patient has no complaints or symptoms. [name] [name], [name] [name] ([id number]) Oncologic The patient has no complaints or symptoms. Psychiatric The patient has no complaints or symptoms. Medications urea warfarin 2.5 mg tablet oral 1 1 tablet oral daily gabapentin 100 mg capsule oral 1 1 capsule oral nightly losartan 25 mg tablet oral 1 1 tablet oral daily ICaps AREDS 7,160 unit-113 mg-100 unit tablet,delayed release oral 1 1 tablet,delayed release (DR/EC) oral daily metoprolol tartrate 25 mg tablet oral 2 2 tablets oral (50 mg.) two times daily FreeStyle Libre Reader miscellaneous misc miscellaneous Lantus Solostar U-100 Insulin 100 unit/mL (3 mL) subcutaneous pen subcutaneous insulin pen subcutaneous inject 50 units nightly Ferrex 150 mg iron capsule oral 1 1 capsule oral daily torsemide 20 mg tablet oral 1 1 tablet oral DAILY potassium chloride ER 20 mEq tablet,extended release oral one half tablet  extended release oral (10 mg.) every other day levothyroxine 25 mcg tablet oral 1 1 tablet oral DAILY triamcinolone acetonide 0.1 % topical ointment topical ointment topical cyanocobalamin (vitamin B-12) 2,500 mcg tablet oral 1 1 tablet oral daily Objective Constitutional Pulse regular. Respirations normal and unlabored. Afebrile. Vitals Time Taken: 8:11 AM, Height: 63 in, Source: Measured, Weight: 212 lbs, Source: Measured, BMI: 37.6, Temperature: 97.6 F, Pulse: 61 bpm, Respiratory Rate: 20 breaths/min, Blood Pressure: 99/57 mmHg. Eyes Nonicteric. Reactive to light. Ears, Nose, Mouth, and Throat Lips, teeth, and gums WNL.Marland Kitchen Moist mucosa without lesions. Neck supple and nontender. No palpable supraclavicular or cervical adenopathy. Normal sized without goiter. Respiratory WNL. No retractions.. Breath sounds WNL, No rubs, rales, rhonchi, or wheeze.. Cardiovascular Pedal Pulses WNL. ABIs were noncompressible. bilateral stage II lymphedema. Gastrointestinal (GI) Abdomen without masses or tenderness.. No liver or spleen enlargement or tenderness.Marland Kitchen [place], Pihu ([id number]) Lymphatic No adneopathy. No adenopathy. No adenopathy. Musculoskeletal Adexa without tenderness or enlargement.. Digits and nails w/o clubbing, cyanosis, infection, petechiae, ischemia, or inflammatory conditions.Marland Kitchen Psychiatric Judgement and insight Intact.. No evidence of depression, anxiety, or agitation.. General Notes: the patient has massive bilateral stage II lymphedema with an ulceration on the left lower extremity which is oozing fluid. There is no cellulitis. No sharp debridement was required today. Integumentary (Hair, Skin) No suspicious lesions. No  crepitus or fluctuance. No peri-wound warmth or erythema. No masses.. Wound #1 status is Open. Original cause of wound was Blister. The wound is located on the Left,Posterior Lower Leg. The wound measures 0.6cm length x 0.5cm width x 0.1cm depth; 0.236cm^2  area and 0.024cm^3 volume. There is no tunneling or undermining noted. There is a large amount of serous drainage noted. The wound margin is flat and intact. There is no granulation within the wound bed. There is a large (67-100%) amount of necrotic tissue within the wound bed including Eschar and Adherent Slough. The periwound skin appearance did not exhibit: Callus, Crepitus, Excoriation, Induration, Rash, Scarring, Dry/Scaly, Maceration, Atrophie Blanche, Cyanosis, Ecchymosis, Hemosiderin Staining, Mottled, Pallor, Rubor, Erythema. Periwound temperature was noted as No Abnormality. The periwound has tenderness on palpation. Assessment Active Problems ICD-10 E11.622 - Type 2 diabetes mellitus with other skin ulcer I89.0 - Lymphedema, not elsewhere classified L97.222 - Non-pressure chronic ulcer of left calf with fat layer exposed I50.22 - Chronic systolic (congestive) heart failure N18.3 - Chronic kidney disease, stage 3 (moderate) this 83 year old patient who has several comorbidities including diabetes mellitus, chronic congestive heart failure, chronic kidney disease and has had bilateral lymphedema which is being treated appropriately by her PCP. She and her daughter who has come along and is her caregiver do understand that she is here for supportive care and the medical management is going to be the mainstay of her treatment plan. After review and a thorough discussion, I have recommended: 1. Silver alginate and a light Kerlix and Coban dressing to be applied to the left lower extremity 2. Will use her own compression stockings on the right lower extremity 3. Elevation and exercise has been discussed in great detail 4. arterial duplex study 5. Vitamin A, vitamin C and zinc. Adequate amount of protein 6. Good control of her diabetes mellitus She and her daughter have had all questions answered to their satisfaction [name] [name], [name] [name] ([id number]) Plan Wound Cleansing: Wound #1  Left,Posterior Lower Leg: Clean wound with Normal Saline. May Shower, gently pat wound dry prior to applying new dressing. Anesthetic: Wound #1 Left,Posterior Lower Leg: Topical Lidocaine 4% cream applied to wound bed prior to debridement Primary Wound Dressing: Wound #1 Left,Posterior Lower Leg: Silvercel Non-Adherent Secondary Dressing: Wound #1 Left,Posterior Lower Leg: ABD pad XtraSorb - if needed for drainage Dressing Change Frequency: Wound #1 Left,Posterior Lower Leg: Change Dressing Monday, Wednesday, Friday Follow-up Appointments: Wound #1 Left,Posterior Lower Leg: Return Appointment in 1 week. Edema Control: Wound #1 Left,Posterior Lower Leg: Kerlix and Coban - Left Lower Extremity - lightly from toes to 3cm below the knee - may anchor with unna wrap if needed Additional Orders / Instructions: Wound #1 Left,Posterior Lower Leg: Increase protein intake. Other: - Please try to keep blood sugars below 180. Please add over the counter vitamin C, multivitamin and zinc supplements to your diet. Home Health: Wound #1 Left,Posterior Lower Leg: Initiate Home Health for Skilled Nursing Home Health Nurse may visit PRN to address patient s wound care needs. FACE TO FACE ENCOUNTER: MEDICARE and MEDICAID PATIENTS: I certify that this patient is under my care and that I had a face-to-face encounter that meets the physician face-to-face encounter requirements with this patient on this date. The encounter with the patient was in whole or in part for the following MEDICAL CONDITION: (primary reason for Home Healthcare) MEDICAL NECESSITY: I certify, that based on my findings, NURSING services are a medically necessary home health service. HOME BOUND STATUS: I certify that my  clinical findings support that this patient is homebound (i.e., Due to illness or injury, pt requires aid of supportive devices such as crutches, cane, wheelchairs, walkers, the use of special transportation or the  assistance of another person to leave their place of residence. There is a normal inability to leave the home and doing so requires considerable and taxing effort. Other absences are for medical reasons / religious services and are infrequent or of short duration when for other reasons). If current dressing causes regression in wound condition, may D/C ordered dressing product/s and apply Normal Saline Moist Dressing daily until next Wound Healing Center / Other MD appointment. Notify Wound Healing Center of regression in wound condition at [phone]. Please direct any NON-WOUND related issues/requests for orders to patient's Primary Care Physician Services and Therapies ordered were: Arterial Studies- Bilateral this 83 year old patient who has several comorbidities including diabetes mellitus, chronic congestive heart failure, chronic kidney disease and has had bilateral lymphedema which is being treated appropriately by her PCP. She and her daughter [name] [name], [name] [name] ([id number]) who has come along and is her caregiver do understand that she is here for supportive care and the medical management is going to be the mainstay of her treatment plan. After review and a thorough discussion, I have recommended: 1. Silver alginate and a light Kerlix and Coban dressing to be applied to the left lower extremity 2. Will use her own compression stockings on the right lower extremity 3. Elevation and exercise has been discussed in great detail 4. arterial duplex study 5. Vitamin A, vitamin C and zinc. Adequate amount of protein 6. Good control of her diabetes mellitus She and her daughter have had all questions answered to their satisfaction Electronic Signature(s) Signed: 05/19/2017 10:22:47 AM By: Evlyn Kanner MD, FACS Entered By: Evlyn Kanner on 05/19/2017 10:22:47 [name] [name], [name] [name] ([id number]) -------------------------------------------------------------------------------- ROS/PFSH  Details Patient Name: [name] [name] Date of Service: 05/19/2017 8:00 AM Medical Record Number: [id number] Patient Account Number: [id number] Date of Birth/Sex: 07-31-1925 (83 y.o. Female) Treating RN: Curtis Sites Primary Care Provider: Barbette Reichmann Other Clinician: Referring Provider: Barbette Reichmann Treating Provider/Extender: Rudene Re in Treatment: 0 Information Obtained From Caregiver Wound History Do you currently have one or more open woundso Yes How many open wounds do you currently haveo 2 Approximately how long have you had your woundso 5 months How have you been treating your wound(s) until nowo camphil Has your wound(s) ever healed and then re-openedo No Have you had any lab work done in the past montho No Have you tested positive for an antibiotic resistant organism (MRSA, VRE)o No Have you tested positive for osteomyelitis (bone infection)o No Have you had any tests for circulation on your legso No Have you had other problems associated with your woundso Swelling Cardiovascular Complaints and Symptoms: Positive for: LE edema Medical History: Positive for: Arrhythmia - a fib; Congestive Heart Failure; Hypertension Negative for: Angina; Coronary Artery Disease; Deep Vein Thrombosis; Hypotension; Myocardial Infarction; Peripheral Arterial Disease; Peripheral Venous Disease; Phlebitis; Vasculitis Genitourinary Complaints and Symptoms: Positive for: Kidney failure/ Dialysis - CKD stage 3 Constitutional Symptoms (General Health) Complaints and Symptoms: No Complaints or Symptoms Eyes Complaints and Symptoms: No Complaints or Symptoms Medical History: Positive for: Cataracts - removed Past Medical History Notes: macular degeneration Ear/Nose/Mouth/Throat Complaints and Symptoms: No Complaints or Symptoms [name] [name], [name] [name] ([id number]) Hematologic/Lymphatic Complaints and Symptoms: No Complaints or Symptoms Medical History: Positive for:  Anemia Negative for: Hemophilia; Human Immunodeficiency Virus; Lymphedema; Sickle Cell Disease Respiratory Complaints and Symptoms: No Complaints  or Symptoms Medical History: Negative for: Aspiration; Asthma; Chronic Obstructive Pulmonary Disease (COPD); Pneumothorax; Sleep Apnea; Tuberculosis Gastrointestinal Complaints and Symptoms: No Complaints or Symptoms Medical History: Negative for: Cirrhosis ; Colitis; Crohnos; Hepatitis A; Hepatitis B; Hepatitis C Endocrine Medical History: Positive for: Type II Diabetes Treated with: Insulin Immunological Complaints and Symptoms: No Complaints or Symptoms Medical History: Negative for: Lupus Erythematosus; Raynaudos; Scleroderma Integumentary (Skin) Complaints and Symptoms: No Complaints or Symptoms Musculoskeletal Complaints and Symptoms: No Complaints or Symptoms Medical History: Negative for: Gout; Rheumatoid Arthritis; Osteoarthritis; Osteomyelitis Neurologic Complaints and Symptoms: No Complaints or Symptoms Medical HistoryFinis [name]: [name] [name], [name] [name] ([id number]) Negative for: Dementia; Neuropathy Oncologic Complaints and Symptoms: No Complaints or Symptoms Medical History: Negative for: Received Chemotherapy; Received Radiation Psychiatric Complaints and Symptoms: No Complaints or Symptoms HBO Extended History Items Eyes: Cataracts Immunizations Pneumococcal Vaccine: Received Pneumococcal Vaccination: Yes Immunization Notes: up to date Implantable Devices Family and Social History Cancer: Yes - Child; Diabetes: No; Heart Disease: Yes - Mother; Hereditary Spherocytosis: No; Hypertension: No; Kidney Disease: No; Lung Disease: No; Seizures: No; Stroke: No; Thyroid Problems: No; Tuberculosis: No; Former smoker - quit 60 years ago; Marital Status - Widowed; Alcohol Use: Never; Drug Use: No History; Caffeine Use: Daily; Financial Concerns: No; Food, Clothing or Shelter Needs: No; Support System Lacking: No; Transportation  Concerns: No; Advanced Directives: No; Patient does not want information on Advanced Directives Physician Affirmation I have reviewed and agree with the above information. Electronic Signature(s) Signed: 05/19/2017 4:36:05 PM By: Evlyn KannerBritto, Marina Boerner MD, FACS Signed: 05/20/2017 4:46:33 PM By: Curtis Sitesorthy, Joanna Entered By: Evlyn KannerBritto, Minela [place] on 05/19/2017 08:34:35 [name] [name], Jeniah ([id number]) -------------------------------------------------------------------------------- SuperBill Details Patient Name: [name] [name], Kammy Date of Service: 05/19/2017 Medical Record Number: [id number] Patient Account Number: [id number] Date of Birth/Sex: 1926-05-04 (83 y.o. Female) Treating RN: Curtis Sitesorthy, Joanna Primary Care Provider: Barbette ReichmannHande, Vishwanath Other Clinician: Referring Provider: Barbette ReichmannHande, Vishwanath Treating Provider/Extender: Rudene ReBritto, [place] Weeks in Treatment: 0 Diagnosis Coding ICD-10 Codes Code Description E11.622 Type 2 diabetes mellitus with other skin ulcer I89.0 Lymphedema, not elsewhere classified L97.222 Non-pressure chronic ulcer of left calf with fat layer exposed I50.22 Chronic systolic (congestive) heart failure N18.3 Chronic kidney disease, stage 3 (moderate) Facility Procedures CPT4 Code: [id number] Description: 99213 - WOUND CARE VISIT-LEV 3 EST PT Modifier: Quantity: 1 Physician Procedures CPT4 Code: 96295286770473 Description: 99204 - WC PHYS LEVEL 4 - NEW PT ICD-10 Diagnosis Description E11.622 Type 2 diabetes mellitus with other skin ulcer I89.0 Lymphedema, not elsewhere classified L97.222 Non-pressure chronic ulcer of left calf with fat layer expo I50.22  Chronic systolic (congestive) heart failure Modifier: sed Quantity: 1 Electronic Signature(s) Signed: 05/19/2017 10:23:35 AM By: Evlyn KannerBritto, Ethyn Schetter MD, FACS Entered By: Evlyn KannerBritto, Cheral Cappucci on 05/19/2017 10:23:34

## 2017-05-27 ENCOUNTER — Other Ambulatory Visit: Payer: Self-pay | Admitting: Surgery

## 2017-05-27 ENCOUNTER — Encounter: Payer: Medicare Other | Attending: Surgery | Admitting: Surgery

## 2017-05-27 DIAGNOSIS — L97929 Non-pressure chronic ulcer of unspecified part of left lower leg with unspecified severity: Secondary | ICD-10-CM

## 2017-05-27 DIAGNOSIS — I130 Hypertensive heart and chronic kidney disease with heart failure and stage 1 through stage 4 chronic kidney disease, or unspecified chronic kidney disease: Secondary | ICD-10-CM | POA: Insufficient documentation

## 2017-05-27 DIAGNOSIS — H3530 Unspecified macular degeneration: Secondary | ICD-10-CM | POA: Insufficient documentation

## 2017-05-27 DIAGNOSIS — I4891 Unspecified atrial fibrillation: Secondary | ICD-10-CM | POA: Insufficient documentation

## 2017-05-27 DIAGNOSIS — I5022 Chronic systolic (congestive) heart failure: Secondary | ICD-10-CM | POA: Diagnosis not present

## 2017-05-27 DIAGNOSIS — E11622 Type 2 diabetes mellitus with other skin ulcer: Secondary | ICD-10-CM | POA: Diagnosis not present

## 2017-05-27 DIAGNOSIS — N183 Chronic kidney disease, stage 3 (moderate): Secondary | ICD-10-CM | POA: Diagnosis not present

## 2017-05-27 DIAGNOSIS — Z7901 Long term (current) use of anticoagulants: Secondary | ICD-10-CM | POA: Diagnosis not present

## 2017-05-27 DIAGNOSIS — I890 Lymphedema, not elsewhere classified: Secondary | ICD-10-CM | POA: Diagnosis not present

## 2017-05-27 DIAGNOSIS — L97222 Non-pressure chronic ulcer of left calf with fat layer exposed: Secondary | ICD-10-CM | POA: Diagnosis not present

## 2017-05-27 DIAGNOSIS — Z87891 Personal history of nicotine dependence: Secondary | ICD-10-CM | POA: Insufficient documentation

## 2017-05-27 DIAGNOSIS — R0989 Other specified symptoms and signs involving the circulatory and respiratory systems: Secondary | ICD-10-CM

## 2017-05-31 NOTE — Progress Notes (Signed)
KATALAYA, BEEL ([id number]) Visit Report for 05/27/2017 Arrival Information Details Patient Name: [name] [name], [name] [name] Date of Service: 05/27/2017 10:15 AM Medical Record Number: [id number] Patient Account Number: [id number] Date of Birth/Sex: 10/26/1925 (83 y.o. Female) Treating RN: Curtis Sites Primary Care Mirinda Monte: Barbette Reichmann Other Clinician: Referring Yasiel Goyne: Barbette Reichmann Treating Azalynn Maxim/Extender: Rudene Re in Treatment: 1 Visit Information History Since Last Visit Added or deleted any medications: No Patient Arrived: Cane Any new allergies or adverse reactions: No Arrival Time: 10:15 Had a fall or experienced change in No Accompanied By: dtr activities of daily living that may affect Transfer Assistance: None risk of falls: Patient Identification Verified: Yes Signs or symptoms of abuse/neglect since last visito No Secondary Verification Process Yes Hospitalized since last visit: No Completed: Has Dressing in Place as Prescribed: Yes Patient Has Alerts: Yes Has Compression in Place as Prescribed: Yes Patient Alerts: Patient on Blood Pain Present Now: No Thinner DMII warfarin Electronic Signature(s) Signed: 05/27/2017 4:42:33 PM By: Curtis Sites Entered By: Curtis Sites on 05/27/2017 10:19:02 [name] [name] ([id number]) -------------------------------------------------------------------------------- Clinic Level of Care Assessment Details Patient Name: [name] [name] Date of Service: 05/27/2017 10:15 AM Medical Record Number: [id number] Patient Account Number: [id number] Date of Birth/Sex: 1926/05/01 (83 y.o. Female) Treating RN: Curtis Sites Primary Care Davene Jobin: Barbette Reichmann Other Clinician: Referring Finnean Cerami: Barbette Reichmann Treating Tenlee Wollin/Extender: Rudene Re in Treatment: 1 Clinic Level of Care Assessment Items TOOL 4 Quantity Score []  - Use when only an EandM is performed on FOLLOW-UP visit 0 ASSESSMENTS -  Nursing Assessment / Reassessment X - Reassessment of Co-morbidities (includes updates in patient status) 1 10 X- 1 5 Reassessment of Adherence to Treatment Plan ASSESSMENTS - Wound and Skin Assessment / Reassessment X - Simple Wound Assessment / Reassessment - one wound 1 5 []  - 0 Complex Wound Assessment / Reassessment - multiple wounds []  - 0 Dermatologic / Skin Assessment (not related to wound area) ASSESSMENTS - Focused Assessment []  - Circumferential Edema Measurements - multi extremities 0 []  - 0 Nutritional Assessment / Counseling / Intervention X- 1 5 Lower Extremity Assessment (monofilament, tuning fork, pulses) []  - 0 Peripheral Arterial Disease Assessment (using hand held doppler) ASSESSMENTS - Ostomy and/or Continence Assessment and Care []  - Incontinence Assessment and Management 0 []  - 0 Ostomy Care Assessment and Management (repouching, etc.) PROCESS - Coordination of Care X - Simple Patient / Family Education for ongoing care 1 15 []  - 0 Complex (extensive) Patient / Family Education for ongoing care []  - 0 Staff obtains Chiropractor, Records, Test Results / Process Orders []  - 0 Staff telephones HHA, Nursing Homes / Clarify orders / etc []  - 0 Routine Transfer to another Facility (non-emergent condition) []  - 0 Routine Hospital Admission (non-emergent condition) []  - 0 New Admissions / Manufacturing engineer / Ordering NPWT, Apligraf, etc. []  - 0 Emergency Hospital Admission (emergent condition) X- 1 10 Simple Discharge Coordination Runkel, [name] [name] ([id number]) []  - 0 Complex (extensive) Discharge Coordination PROCESS - Special Needs []  - Pediatric / Minor Patient Management 0 []  - 0 Isolation Patient Management []  - 0 Hearing / Language / Visual special needs []  - 0 Assessment of Community assistance (transportation, D/C planning, etc.) []  - 0 Additional assistance / Altered mentation []  - 0 Support Surface(s) Assessment (bed, cushion, seat,  etc.) INTERVENTIONS - Wound Cleansing / Measurement X - Simple Wound Cleansing - one wound 1 5 []  - 0 Complex Wound Cleansing - multiple wounds X- 1 5 Wound Imaging (photographs - any number of wounds) []  -  0 Wound Tracing (instead of photographs) X- 1 5 Simple Wound Measurement - one wound []  - 0 Complex Wound Measurement - multiple wounds INTERVENTIONS - Wound Dressings []  - Small Wound Dressing one or multiple wounds 0 []  - 0 Medium Wound Dressing one or multiple wounds X- 1 20 Large Wound Dressing one or multiple wounds []  - 0 Application of Medications - topical []  - 0 Application of Medications - injection INTERVENTIONS - Miscellaneous []  - External ear exam 0 []  - 0 Specimen Collection (cultures, biopsies, blood, body fluids, etc.) []  - 0 Specimen(s) / Culture(s) sent or taken to Lab for analysis []  - 0 Patient Transfer (multiple staff / Nurse, adultHoyer Lift / Similar devices) []  - 0 Simple Staple / Suture removal (25 or less) []  - 0 Complex Staple / Suture removal (26 or more) []  - 0 Hypo / Hyperglycemic Management (close monitor of Blood Glucose) []  - 0 Ankle / Brachial Index (ABI) - do not check if billed separately X- 1 5 Vital Signs Reeser, [name] [name] ([id number]) Has the patient been seen at the hospital within the last three years: Yes Total Score: 90 Level Of Care: New/Established - Level 3 Electronic Signature(s) Signed: 05/27/2017 4:42:33 PM By: Curtis Sitesorthy, Joanna Entered By: Curtis Sitesorthy, Joanna on 05/27/2017 10:55:17 [name] BudSIEGEL, Darnelle ([id number]) -------------------------------------------------------------------------------- Encounter Discharge Information Details Patient Name: [name] BudSIEGEL, [name] [name] Date of Service: 05/27/2017 10:15 AM Medical Record Number: [id number] Patient Account Number: [id number] Date of Birth/Sex: 19-Sep-1925 (83 y.o. Female) Treating RN: Curtis Sitesorthy, Joanna Primary Care Valbona Slabach: Barbette ReichmannHande, Vishwanath Other Clinician: Referring Andreus Cure: Barbette ReichmannHande,  Vishwanath Treating Ranita Stjulien/Extender: Rudene ReBritto, Errol Weeks in Treatment: 1 Encounter Discharge Information Items Discharge Pain Level: 0 Discharge Condition: Stable Ambulatory Status: Cane Discharge Destination: Home Transportation: Private Auto Accompanied By: dtr Schedule Follow-up Appointment: Yes Medication Reconciliation completed and No provided to Patient/Care Marenda Accardi: Provided on Clinical Summary of Care: 05/27/2017 Form Type Recipient Paper Patient MS Electronic Signature(s) Signed: 05/27/2017 10:56:13 AM By: Curtis Sitesorthy, Joanna Entered By: Curtis Sitesorthy, Joanna on 05/27/2017 10:56:13 [name] BudSIEGEL, [name] [name] ([id number]) -------------------------------------------------------------------------------- Lower Extremity Assessment Details Patient Name: [name] BudSIEGEL, [name] [name] Date of Service: 05/27/2017 10:15 AM Medical Record Number: [id number] Patient Account Number: [id number] Date of Birth/Sex: 19-Sep-1925 (83 y.o. Female) Treating RN: Curtis Sitesorthy, Joanna Primary Care Folasade Mooty: Barbette ReichmannHande, Vishwanath Other Clinician: Referring Jaymie Misch: Barbette ReichmannHande, Vishwanath Treating Yarelis Ambrosino/Extender: Rudene ReBritto, Errol Weeks in Treatment: 1 Edema Assessment Assessed: [Left: No] [Right: No] [Left: Edema] [Right: :] Calf Left: Right: Point of Measurement: 32 cm From Medial Instep 43 cm cm Ankle Left: Right: Point of Measurement: 9 cm From Medial Instep 28 cm cm Vascular Assessment Pulses: Dorsalis Pedis Palpable: [Left:Yes] Posterior Tibial Extremity colors, hair growth, and conditions: Extremity Color: [Left:Hyperpigmented] Hair Growth on Extremity: [Left:No] Temperature of Extremity: [Left:Warm] Capillary Refill: [Left:< 3 seconds] Toe Nail Assessment Left: Right: Thick: Yes Discolored: Yes Deformed: Yes Improper Length and Hygiene: Yes Electronic Signature(s) Signed: 05/27/2017 4:42:33 PM By: Curtis Sitesorthy, Joanna Entered By: Curtis Sitesorthy, Joanna on 05/27/2017 10:25:17 [name] BudSIEGEL, [name] [name]  ([id number]) -------------------------------------------------------------------------------- Multi Wound Chart Details Patient Name: [name] BudSIEGEL, [name] [name] Date of Service: 05/27/2017 10:15 AM Medical Record Number: [id number] Patient Account Number: [id number] Date of Birth/Sex: 19-Sep-1925 (83 y.o. Female) Treating RN: Curtis Sitesorthy, Joanna Primary Care Emiliano Welshans: Barbette ReichmannHande, Vishwanath Other Clinician: Referring Taitum Menton: Barbette ReichmannHande, Vishwanath Treating Nicklaus Alviar/Extender: Rudene ReBritto, Errol Weeks in Treatment: 1 Vital Signs Height(in): 63 Pulse(bpm): 62 Weight(lbs): 212 Blood Pressure(mmHg): 131/52 Body Mass Index(BMI): 38 Temperature(F): 97.8 Respiratory Rate 20 (breaths/min): Photos: [1:No Photos] [N/A:N/A] Wound Location: [1:Left Lower Leg - Posterior] [N/A:N/A] Wounding Event: [1:Blister] [N/A:N/A] Primary Etiology: [1:Diabetic Wound/Ulcer of the Lower Extremity] [  N/A:N/A] Secondary Etiology: [1:Lymphedema] [N/A:N/A] Comorbid History: [1:Cataracts, Anemia, Arrhythmia, Congestive Heart Failure, Hypertension, Type II Diabetes] [N/A:N/A] Date Acquired: [1:12/28/2016] [N/A:N/A] Weeks of Treatment: [1:1] [N/A:N/A] Wound Status: [1:Open] [N/A:N/A] Measurements L x W x D [1:0.5x0.5x0.1] [N/A:N/A] (cm) Area (cm) : [1:0.196] [N/A:N/A] Volume (cm) : [1:0.02] [N/A:N/A] % Reduction in Area: [1:16.90%] [N/A:N/A] % Reduction in Volume: [1:16.70%] [N/A:N/A] Classification: [1:Grade 1] [N/A:N/A] Exudate Amount: [1:Large] [N/A:N/A] Exudate Type: [1:Serous] [N/A:N/A] Exudate Color: [1:amber] [N/A:N/A] Wound Margin: [1:Flat and Intact] [N/A:N/A] Granulation Amount: [1:None Present (0%)] [N/A:N/A] Necrotic Amount: [1:Large (67-100%)] [N/A:N/A] Necrotic Tissue: [1:Eschar, Adherent Slough] [N/A:N/A] Exposed Structures: [1:Fascia: No Fat Layer (Subcutaneous Tissue) Exposed: No Tendon: No Muscle: No Joint: No Bone: No] [N/A:N/A] Epithelialization: [1:None] [N/A:N/A] Periwound Skin Texture: [1:Excoriation: No  Induration: No] [N/A:N/A] Callus: No Crepitus: No Rash: No Scarring: No Periwound Skin Moisture: Maceration: No N/A N/A Dry/Scaly: No Periwound Skin Color: Atrophie Blanche: No N/A N/A Cyanosis: No Ecchymosis: No Erythema: No Hemosiderin Staining: No Mottled: No Pallor: No Rubor: No Temperature: No Abnormality N/A N/A Tenderness on Palpation: Yes N/A N/A Wound Preparation: Ulcer Cleansing: N/A N/A Rinsed/Irrigated with Saline, Other: soap and water Topical Anesthetic Applied: Other: lidocaine 4% Treatment Notes Electronic Signature(s) Signed: 05/27/2017 10:45:35 AM By: Evlyn KannerBritto, Errol MD, FACS Entered By: Evlyn KannerBritto, Errol on 05/27/2017 10:45:35 [name] BudSIEGEL, Darianna ([id number]) -------------------------------------------------------------------------------- Multi-Disciplinary Care Plan Details Patient Name: [name] BudSIEGEL, [name] [name] Date of Service: 05/27/2017 10:15 AM Medical Record Number: [id number] Patient Account Number: [id number] Date of Birth/Sex: Dec 08, 1925 (83 y.o. Female) Treating RN: Curtis Sitesorthy, Joanna Primary Care Lota Leamer: Barbette ReichmannHande, Vishwanath Other Clinician: Referring Mekhai Venuto: Barbette ReichmannHande, Vishwanath Treating Ledarius Leeson/Extender: Rudene ReBritto, Errol Weeks in Treatment: 1 Active Inactive ` Abuse / Safety / Falls / Self Care Management Nursing Diagnoses: Impaired physical mobility Goals: Patient will not experience any injury related to falls Date Initiated: 05/19/2017 Target Resolution Date: 07/30/2017 Goal Status: Active Interventions: Assess fall risk on admission and as needed Notes: ` Orientation to the Wound Care Program Nursing Diagnoses: Knowledge deficit related to the wound healing center program Goals: Patient/caregiver will verbalize understanding of the Wound Healing Center Program Date Initiated: 05/19/2017 Target Resolution Date: 07/30/2017 Goal Status: Active Interventions: Provide education on orientation to the wound center Notes: ` Wound/Skin Impairment Nursing  Diagnoses: Impaired tissue integrity Goals: Ulcer/skin breakdown will heal within 14 weeks Date Initiated: 05/19/2017 Target Resolution Date: 07/30/2017 Goal Status: Active Interventions: [name] BudSIEGEL, [name] [name] ([id number]) Assess patient/caregiver ability to obtain necessary supplies Assess patient/caregiver ability to perform ulcer/skin care regimen upon admission and as needed Assess ulceration(s) every visit Notes: Electronic Signature(s) Signed: 05/27/2017 4:42:33 PM By: Curtis Sitesorthy, Joanna Entered By: Curtis Sitesorthy, Joanna on 05/27/2017 10:25:20 [name] BudSIEGEL, [name] [name] ([id number]) -------------------------------------------------------------------------------- Pain Assessment Details Patient Name: [name] BudSIEGEL, [name] [name] Date of Service: 05/27/2017 10:15 AM Medical Record Number: [id number] Patient Account Number: [id number] Date of Birth/Sex: Dec 08, 1925 (83 y.o. Female) Treating RN: Curtis Sitesorthy, Joanna Primary Care Feliberto Stockley: Barbette ReichmannHande, Vishwanath Other Clinician: Referring Latoiya Maradiaga: Barbette ReichmannHande, Vishwanath Treating Stephaun Million/Extender: Rudene ReBritto, Errol Weeks in Treatment: 1 Active Problems Location of Pain Severity and Description of Pain Patient Has Paino No Site Locations Pain Management and Medication Current Pain Management: Notes Topical or injectable lidocaine is offered to patient for acute pain when surgical debridement is performed. If needed, Patient is instructed to use over the counter pain medication for the following 24-48 hours after debridement. Wound care MDs do not prescribed pain medications. Patient has chronic pain or uncontrolled pain. Patient has been instructed to make an appointment with their Primary Care Physician for pain management. Electronic Signature(s) Signed: 05/27/2017 4:42:33 PM By: Curtis Sitesorthy, Joanna  Entered By: Curtis Sites on 05/27/2017 10:19:16 [name] [name] ([id number]) -------------------------------------------------------------------------------- Patient/Caregiver Education  Details Patient Name: [name] [name] Date of Service: 05/27/2017 10:15 AM Medical Record Number: [id number] Patient Account Number: [id number] Date of Birth/Gender: 03/08/1926 (83 y.o. Female) Treating RN: Curtis Sites Primary Care Physician: Barbette Reichmann Other Clinician: Referring Physician: Barbette Reichmann Treating Physician/Extender: Rudene Re in Treatment: 1 Education Assessment Education Provided To: Patient and Caregiver Education Topics Provided Venous: Handouts: Other: leg elevation Methods: Explain/Verbal Responses: State content correctly Electronic Signature(s) Signed: 05/27/2017 4:42:33 PM By: Curtis Sites Entered By: Curtis Sites on 05/27/2017 10:56:29 [name] [name] ([id number]) -------------------------------------------------------------------------------- Wound Assessment Details Patient Name: [name] [name] Date of Service: 05/27/2017 10:15 AM Medical Record Number: [id number] Patient Account Number: [id number] Date of Birth/Sex: 11/08/1925 (83 y.o. Female) Treating RN: Curtis Sites Primary Care Ritchard Paragas: Barbette Reichmann Other Clinician: Referring Jamica Woodyard: Barbette Reichmann Treating Lakena Sparlin/Extender: Rudene Re in Treatment: 1 Wound Status Wound Number: 1 Primary Diabetic Wound/Ulcer of the Lower Extremity Etiology: Wound Location: Left Lower Leg - Posterior Secondary Lymphedema Wounding Event: Blister Etiology: Date Acquired: 12/28/2016 Wound Status: Open Weeks Of Treatment: 1 Comorbid Cataracts, Anemia, Arrhythmia, Congestive Clustered Wound: No History: Heart Failure, Hypertension, Type II Diabetes Photos Photo Uploaded By: Curtis Sites on 05/27/2017 16:41:11 Wound Measurements Length: (cm) 0.5 Width: (cm) 0.5 Depth: (cm) 0.1 Area: (cm) 0.196 Volume: (cm) 0.02 % Reduction in Area: 16.9% % Reduction in Volume: 16.7% Epithelialization: None Tunneling: No Undermining: No Wound  Description Classification: Grade 1 Wound Margin: Flat and Intact Exudate Amount: Large Exudate Type: Serous Exudate Color: amber Foul Odor After Cleansing: No Slough/Fibrino Yes Wound Bed Granulation Amount: None Present (0%) Exposed Structure Necrotic Amount: Large (67-100%) Fascia Exposed: No Necrotic Quality: Eschar, Adherent Slough Fat Layer (Subcutaneous Tissue) Exposed: No Tendon Exposed: No Muscle Exposed: No Joint Exposed: No Bone Exposed: No Periwound Skin Texture Stripling, Sierria ([id number]) Texture Color No Abnormalities Noted: No No Abnormalities Noted: No Callus: No Atrophie Blanche: No Crepitus: No Cyanosis: No Excoriation: No Ecchymosis: No Induration: No Erythema: No Rash: No Hemosiderin Staining: No Scarring: No Mottled: No Pallor: No Moisture Rubor: No No Abnormalities Noted: No Dry / Scaly: No Temperature / Pain Maceration: No Temperature: No Abnormality Tenderness on Palpation: Yes Wound Preparation Ulcer Cleansing: Rinsed/Irrigated with Saline, Other: soap and water, Topical Anesthetic Applied: Other: lidocaine 4%, Treatment Notes Wound #1 (Left, Posterior Lower Leg) 1. Cleansed with: Clean wound with Normal Saline 2. Anesthetic Topical Lidocaine 4% cream to wound bed prior to debridement 4. Dressing Applied: Other dressing (specify in notes) 5. Secondary Dressing Applied ABD Pad 7. Secured with Other (specify in notes) Notes silvercel, kerlix and coban wrap, unna to Ecologist) Signed: 05/27/2017 4:42:33 PM By: Curtis Sites Entered By: Curtis Sites on 05/27/2017 10:23:37 [name] [name] ([id number]) -------------------------------------------------------------------------------- Vitals Details Patient Name: [name] [name] Date of Service: 05/27/2017 10:15 AM Medical Record Number: [id number] Patient Account Number: [id number] Date of Birth/Sex: [birth year] (83 y.o. Female) Treating RN: Curtis Sites Primary Care Keely Drennan: Barbette Reichmann Other Clinician: Referring Guillermina Shaft: Barbette Reichmann Treating Aaran Enberg/Extender: Rudene Re in Treatment: 1 Vital Signs Time Taken: 10:19 Temperature (F): 97.8 Height (in): 63 Pulse (bpm): 62 Weight (lbs): 212 Respiratory Rate (breaths/min): 20 Body Mass Index (BMI): 37.6 Blood Pressure (mmHg): 131/52 Reference Range: 80 - 120 mg / dl Electronic Signature(s) Signed: 05/27/2017 4:42:33 PM By: Curtis Sites Entered By: Curtis Sites on 05/27/2017 10:19:53

## 2017-05-31 NOTE — Progress Notes (Signed)
[name] [name], [name] [name] ([id number]) Visit Report for 05/27/2017 Chief Complaint Document Details Patient Name: [name] [name], [name] [name] Date of Service: 05/27/2017 10:15 AM Medical Record Number: [id number] Patient Account Number: [id number] Date of Birth/Sex: 09-27-1925 (83 y.o. Female) Treating RN: Curtis Sitesorthy, Joanna Primary Care Provider: Barbette ReichmannHande, Vishwanath Other Clinician: Referring Provider: Barbette ReichmannHande, Vishwanath Treating Provider/Extender: Rudene ReBritto, Terecia Plaut Weeks in Treatment: 1 Information Obtained from: Patient Chief Complaint Patient presents to the wound care center for a consult due non healing wound to the left lower extremity with bilateral massive swelling of the legs which she's had for about 4 months Electronic Signature(s) Signed: 05/27/2017 10:45:43 AM By: Evlyn KannerBritto, Jovonta Levit MD, FACS Entered By: Evlyn KannerBritto, Severina Sykora on 05/27/2017 10:45:42 [name] [name], [name] [name] ([id number]) -------------------------------------------------------------------------------- HPI Details Patient Name: [name] [name], [name] [name] Date of Service: 05/27/2017 10:15 AM Medical Record Number: [id number] Patient Account Number: [id number] Date of Birth/Sex: 09-27-1925 (83 y.o. Female) Treating RN: Curtis Sitesorthy, Joanna Primary Care Provider: Barbette ReichmannHande, Vishwanath Other Clinician: Referring Provider: Barbette ReichmannHande, Vishwanath Treating Provider/Extender: Rudene ReBritto, Jovonni Borquez Weeks in Treatment: 1 History of Present Illness Location: bilateral swelling of the legs associated with ulceration on the left lower extremity Quality: Patient reports experiencing a dull pain to affected area(s). Severity: Patient states wound are getting worse. Duration: Patient has had the wound for > 3 months prior to seeking treatment at the wound center Timing: Pain in wound is Intermittent (comes and goes Context: The wound would happen gradually Modifying Factors: Other treatment(s) tried include:local care as per the PCP Associated Signs and Symptoms: Patient reports having increase swelling. HPI  Description: 83 year old patient seen by her PCP and referred to our center for left lower extremity stasis ulceration. She is known to have diabetes mellitus type 2, hypertension,status post cholecystectomy and open heart surgery, and atrial fibrillation and has bilateral lower extremity lymphedema with oozing of fluid. Her most recent hemoglobin A1c was 7.2. after assessment by the PCP she was put on torsemide 20 mg daily, echo was noted to have an ejection fraction of more than 55% with moderate MR, and the patient was maintained on warfarin for atrial fibrillation. [place] a year but has been traveling a bit and has not had any arterial or venous studies done recently. Her last echo was about a year and a half ago. 05/27/2017 -- she has not had any dressing changes since the last time she was here as a home health did not turn up. Her arterial duplex study is scheduled for next week. Electronic Signature(s) Signed: 05/27/2017 10:46:11 AM By: Evlyn KannerBritto, Shaylene Paganelli MD, FACS Entered By: Evlyn KannerBritto, Adalay Azucena on 05/27/2017 10:46:11 [name] [name], [name] [name] ([id number]) -------------------------------------------------------------------------------- Physical Exam Details Patient Name: [name] [name], [name] [name] Date of Service: 05/27/2017 10:15 AM Medical Record Number: [id number] Patient Account Number: [id number] Date of Birth/Sex: 09-27-1925 (83 y.o. Female) Treating RN: Curtis Sitesorthy, Joanna Primary Care Provider: Barbette ReichmannHande, Vishwanath Other Clinician: Referring Provider: Barbette ReichmannHande, Vishwanath Treating Provider/Extender: Rudene ReBritto, Lalla Laham Weeks in Treatment: 1 Constitutional . Pulse regular. Respirations normal and unlabored. Afebrile. . Eyes Nonicteric. Reactive to light. Ears, Nose, Mouth, and Throat Lips, teeth, and gums WNL.Marland Kitchen. Moist mucosa without lesions. Neck supple and nontender. No palpable supraclavicular or cervical adenopathy. Normal sized without goiter. Respiratory WNL. No  retractions.. Cardiovascular Pedal Pulses WNL. No clubbing, cyanosis or edema. Lymphatic No adneopathy. No adenopathy. No adenopathy. Musculoskeletal Adexa without tenderness or enlargement.. Digits and nails w/o clubbing, cyanosis, infection, petechiae, ischemia, or inflammatory conditions.. Integumentary (Hair, Skin) No suspicious lesions. No crepitus or fluctuance. No peri-wound warmth or erythema. No masses.Marland Kitchen. Psychiatric Judgement and  insight Intact.. No evidence of depression, anxiety, or agitation.. Notes the stage II lymphedema bilaterally persists and the wound on the left lower extremity has less lymph oriented today. No sharp debridement was required today. Electronic Signature(s) Signed: 05/27/2017 10:47:11 AM By: Evlyn Kanner MD, FACS Entered By: Evlyn Kanner on 05/27/2017 10:47:10 [name] [name] ([id number]) -------------------------------------------------------------------------------- Physician Orders Details Patient Name: [name] [name] Date of Service: 05/27/2017 10:15 AM Medical Record Number: [id number] Patient Account Number: [id number] Date of Birth/Sex: May 05, 1926 (83 y.o. Female) Treating RN: Curtis Sites Primary Care Provider: Barbette Reichmann Other Clinician: Referring Provider: Barbette Reichmann Treating Provider/Extender: Rudene Re in Treatment: 1 Verbal / Phone Orders: No Diagnosis Coding Wound Cleansing Wound #1 Left,Posterior Lower Leg o Clean wound with Normal Saline. o May Shower, gently pat wound dry prior to applying new dressing. Anesthetic Wound #1 Left,Posterior Lower Leg o Topical Lidocaine 4% cream applied to wound bed prior to debridement Primary Wound Dressing Wound #1 Left,Posterior Lower Leg o Silvercel Non-Adherent Secondary Dressing Wound #1 Left,Posterior Lower Leg o ABD pad o XtraSorb - if needed for drainage Dressing Change Frequency Wound #1 Left,Posterior Lower Leg o Change Dressing Monday,  Wednesday, Friday Follow-up Appointments Wound #1 Left,Posterior Lower Leg o Return Appointment in 1 week. Edema Control Wound #1 Left,Posterior Lower Leg o Kerlix and Coban - Left Lower Extremity - lightly from toes to 3cm below the knee - may anchor with unna wrap if needed Additional Orders / Instructions Wound #1 Left,Posterior Lower Leg o Increase protein intake. o Other: - Please try to keep blood sugars below 180. Please add over the counter vitamin C, multivitamin and zinc supplements to your diet. Home Health Wound #1 Left,Posterior Lower Leg o Initiate Home Health for Skilled Nursing o Home Health Nurse may visit PRN to address patientos wound care needs. [name] [name], [name] [name] ([id number]) o FACE TO FACE ENCOUNTER: MEDICARE and MEDICAID PATIENTS: I certify that this patient is under my care and that I had a face-to-face encounter that meets the physician face-to-face encounter requirements with this patient on this date. The encounter with the patient was in whole or in part for the following MEDICAL CONDITION: (primary reason for Home Healthcare) MEDICAL NECESSITY: I certify, that based on my findings, NURSING services are a medically necessary home health service. HOME BOUND STATUS: I certify that my clinical findings support that this patient is homebound (i.e., Due to illness or injury, pt requires aid of supportive devices such as crutches, cane, wheelchairs, walkers, the use of special transportation or the assistance of another person to leave their place of residence. There is a normal inability to leave the home and doing so requires considerable and taxing effort. Other absences are for medical reasons / religious services and are infrequent or of short duration when for other reasons). o If current dressing causes regression in wound condition, may D/C ordered dressing product/s and apply Normal Saline Moist Dressing daily until next Wound Healing Center /  Other MD appointment. Notify Wound Healing Center of regression in wound condition at [phone]. o Please direct any NON-WOUND related issues/requests for orders to patient's Primary Care Physician Electronic Signature(s) Signed: 05/27/2017 4:35:05 PM By: Evlyn Kanner MD, FACS Signed: 05/27/2017 4:42:33 PM By: Curtis Sites Entered By: Curtis Sites on 05/27/2017 10:35:11 [name] [name] ([id number]) -------------------------------------------------------------------------------- Problem List Details Patient Name: [name] [name] Date of Service: 05/27/2017 10:15 AM Medical Record Number: [id number] Patient Account Number: [id number] Date of Birth/Sex: 10/22/1925 (83 y.o. Female) Treating RN: Curtis Sites Primary Care Provider: Barbette Reichmann  Other Clinician: Referring Provider: Barbette Reichmann Treating Provider/Extender: Rudene Re in Treatment: 1 Active Problems ICD-10 Encounter Code Description Active Date Diagnosis E11.622 Type 2 diabetes mellitus with other skin ulcer 05/19/2017 Yes I89.0 Lymphedema, not elsewhere classified 05/19/2017 Yes L97.222 Non-pressure chronic ulcer of left calf with fat layer exposed 05/19/2017 Yes I50.22 Chronic systolic (congestive) heart failure 05/19/2017 Yes N18.3 Chronic kidney disease, stage 3 (moderate) 05/19/2017 Yes Inactive Problems Resolved Problems Electronic Signature(s) Signed: 05/27/2017 10:45:31 AM By: Evlyn Kanner MD, FACS Entered By: Evlyn Kanner on 05/27/2017 10:45:31 [name] [name] ([id number]) -------------------------------------------------------------------------------- Progress Note Details Patient Name: [name] [name] Date of Service: 05/27/2017 10:15 AM Medical Record Number: [id number] Patient Account Number: [id number] Date of Birth/Sex: April 03, 1926 (83 y.o. Female) Treating RN: Curtis Sites Primary Care Provider: Barbette Reichmann Other Clinician: Referring Provider: Barbette Reichmann Treating Provider/Extender: Rudene Re in Treatment: 1 Subjective Chief Complaint Information obtained from Patient Patient presents to the wound care center for a consult due non healing wound to the left lower extremity with bilateral massive swelling of the legs which she's had for about 4 months History of Present Illness (HPI) The following HPI elements were documented for the patient's wound: Location: bilateral swelling of the legs associated with ulceration on the left lower extremity Quality: Patient reports experiencing a dull pain to affected area(s). Severity: Patient states wound are getting worse. Duration: Patient has had the wound for > 3 months prior to seeking treatment at the wound center Timing: Pain in wound is Intermittent (comes and goes Context: The wound would happen gradually Modifying Factors: Other treatment(s) tried include:local care as per the PCP Associated Signs and Symptoms: Patient reports having increase swelling. 83 year old patient seen by her PCP and referred to our center for left lower extremity stasis ulceration. She is known to have diabetes mellitus type 2, hypertension,status post cholecystectomy and open heart surgery, and atrial fibrillation and has bilateral lower extremity lymphedema with oozing of fluid. Her most recent hemoglobin A1c was 7.2. after assessment by the PCP she was put on torsemide 20 mg daily, echo was noted to have an ejection fraction of more than 55% with moderate MR, and the patient was maintained on warfarin for atrial fibrillation. the patient has been living in [place] for about a year but has been traveling a bit and has not had any arterial or venous studies done recently. Her last echo was about a year and a half ago. 05/27/2017 -- she has not had any dressing changes since the last time she was here as a home health did not turn up. Her arterial duplex study is scheduled for next  week. Patient History Information obtained from Patient. Family History Cancer - Child, Heart Disease - Mother, No family history of Diabetes, Hereditary Spherocytosis, Hypertension, Kidney Disease, Lung Disease, Seizures, Stroke, Thyroid Problems, Tuberculosis. Social History Former smoker - quit 60 years ago, Marital Status - Widowed, Alcohol Use - Never, Drug Use - No History, Caffeine Use - Daily. Medical And Surgical History Notes Eyes macular degeneration Wardrip, Elasia ([id number]) Objective Constitutional Pulse regular. Respirations normal and unlabored. Afebrile. Vitals Time Taken: 10:19 AM, Height: 63 in, Weight: 212 lbs, BMI: 37.6, Temperature: 97.8 F, Pulse: 62 bpm, Respiratory Rate: 20 breaths/min, Blood Pressure: 131/52 mmHg. Eyes Nonicteric. Reactive to light. Ears, Nose, Mouth, and Throat Lips, teeth, and gums WNL.Marland Kitchen Moist mucosa without lesions. Neck supple and nontender. No palpable supraclavicular or cervical adenopathy. Normal sized without goiter. Respiratory WNL. No retractions.. Cardiovascular Pedal Pulses WNL. No  clubbing, cyanosis or edema. Lymphatic No adneopathy. No adenopathy. No adenopathy. Musculoskeletal Adexa without tenderness or enlargement.. Digits and nails w/o clubbing, cyanosis, infection, petechiae, ischemia, or inflammatory conditions.Marland Kitchen Psychiatric Judgement and insight Intact.. No evidence of depression, anxiety, or agitation.. General Notes: the stage II lymphedema bilaterally persists and the wound on the left lower extremity has less lymph oriented today. No sharp debridement was required today. Integumentary (Hair, Skin) No suspicious lesions. No crepitus or fluctuance. No peri-wound warmth or erythema. No masses.. Wound #1 status is Open. Original cause of wound was Blister. The wound is located on the Left,Posterior Lower Leg. The wound measures 0.5cm length x 0.5cm width x 0.1cm depth; 0.196cm^2 area and 0.02cm^3 volume. There  is no tunneling or undermining noted. There is a large amount of serous drainage noted. The wound margin is flat and intact. There is no granulation within the wound bed. There is a large (67-100%) amount of necrotic tissue within the wound bed including Eschar and Adherent Slough. The periwound skin appearance did not exhibit: Callus, Crepitus, Excoriation, Induration, Rash, Scarring, Dry/Scaly, Maceration, Atrophie Blanche, Cyanosis, Ecchymosis, Hemosiderin Staining, Mottled, Pallor, Rubor, Erythema. Periwound temperature was noted as No Abnormality. The periwound has tenderness on palpation. CHARLESTON, VIERLING ([id number]) Assessment Active Problems ICD-10 E11.622 - Type 2 diabetes mellitus with other skin ulcer I89.0 - Lymphedema, not elsewhere classified L97.222 - Non-pressure chronic ulcer of left calf with fat layer exposed I50.22 - Chronic systolic (congestive) heart failure N18.3 - Chronic kidney disease, stage 3 (moderate) Plan Wound Cleansing: Wound #1 Left,Posterior Lower Leg: Clean wound with Normal Saline. May Shower, gently pat wound dry prior to applying new dressing. Anesthetic: Wound #1 Left,Posterior Lower Leg: Topical Lidocaine 4% cream applied to wound bed prior to debridement Primary Wound Dressing: Wound #1 Left,Posterior Lower Leg: Silvercel Non-Adherent Secondary Dressing: Wound #1 Left,Posterior Lower Leg: ABD pad XtraSorb - if needed for drainage Dressing Change Frequency: Wound #1 Left,Posterior Lower Leg: Change Dressing Monday, Wednesday, Friday Follow-up Appointments: Wound #1 Left,Posterior Lower Leg: Return Appointment in 1 week. Edema Control: Wound #1 Left,Posterior Lower Leg: Kerlix and Coban - Left Lower Extremity - lightly from toes to 3cm below the knee - may anchor with unna wrap if needed Additional Orders / Instructions: Wound #1 Left,Posterior Lower Leg: Increase protein intake. Other: - Please try to keep blood sugars below 180.  Please add over the counter vitamin C, multivitamin and zinc supplements to your diet. Home Health: Wound #1 Left,Posterior Lower Leg: Initiate Home Health for Skilled Nursing Home Health Nurse may visit PRN to address patient s wound care needs. FACE TO FACE ENCOUNTER: MEDICARE and MEDICAID PATIENTS: I certify that this patient is under my care and that I had a face-to-face encounter that meets the physician face-to-face encounter requirements with this patient on this date. The encounter with the patient was in whole or in part for the following MEDICAL CONDITION: (primary reason for Home Healthcare) MEDICAL NECESSITY: I certify, that based on my findings, NURSING services are a medically necessary home health service. HOME BOUND STATUS: I certify that my clinical findings support that this patient is homebound (i.e., Due to illness or injury, pt requires aid of supportive devices such as crutches, cane, wheelchairs, walkers, the use of special Tagle, Lelaina ([id number]) transportation or the assistance of another person to leave their place of residence. There is a normal inability to leave the home and doing so requires considerable and taxing effort. Other absences are for medical reasons / religious services and  are infrequent or of short duration when for other reasons). If current dressing causes regression in wound condition, may D/C ordered dressing product/s and apply Normal Saline Moist Dressing daily until next Wound Healing Center / Other MD appointment. Notify Wound Healing Center of regression in wound condition at [phone]. Please direct any NON-WOUND related issues/requests for orders to patient's Primary Care Physician I reiterated to the daughter who is her caregiver, to make sure we know if her home health doesn't turn up so we can make alternative arrangements. After review and a thorough discussion, I have recommended: 1. Silver alginate and a light Kerlix and  Coban dressing to be applied to the left lower extremity, to be changed 3 times a week 2. Will use her own compression stockings on the right lower extremity 3. Elevation and exercise has been discussed in great detail 4. arterial duplex study -- ointment pending next week 5. Vitamin A, vitamin C and zinc. Adequate amount of protein 6. Good control of her diabetes mellitus She and her daughter have had all questions answered to their satisfaction Electronic Signature(s) Signed: 05/27/2017 10:48:36 AM By: Evlyn KannerBritto, Blake Vetrano MD, FACS Entered By: Evlyn KannerBritto, Onaje Warne on 05/27/2017 10:48:36 [name] [name], [name] [name] ([id number]) -------------------------------------------------------------------------------- ROS/PFSH Details Patient Name: [name] [name], Julizza Date of Service: 05/27/2017 10:15 AM Medical Record Number: [id number] Patient Account Number: [id number] Date of Birth/Sex: February 14, 1926 (83 y.o. Female) Treating RN: Curtis Sitesorthy, Joanna Primary Care Provider: Barbette ReichmannHande, Vishwanath Other Clinician: Referring Provider: Barbette ReichmannHande, Vishwanath Treating Provider/Extender: Rudene ReBritto, Lori Liew Weeks in Treatment: 1 Information Obtained From Patient Wound History Do you currently have one or more open woundso Yes How many open wounds do you currently haveo 2 Approximately how long have you had your woundso 5 months How have you been treating your wound(s) until nowo camphil Has your wound(s) ever healed and then re-openedo No Have you had any lab work done in the past montho No Have you tested positive for an antibiotic resistant organism (MRSA, VRE)o No Have you tested positive for osteomyelitis (bone infection)o No Have you had any tests for circulation on your legso No Have you had other problems associated with your woundso Swelling Eyes Medical History: Positive for: Cataracts - removed Past Medical History Notes: macular degeneration Hematologic/Lymphatic Medical History: Positive for: Anemia Negative for: Hemophilia;  Human Immunodeficiency Virus; Lymphedema; Sickle Cell Disease Respiratory Medical History: Negative for: Aspiration; Asthma; Chronic Obstructive Pulmonary Disease (COPD); Pneumothorax; Sleep Apnea; Tuberculosis Cardiovascular Medical History: Positive for: Arrhythmia - a fib; Congestive Heart Failure; Hypertension Negative for: Angina; Coronary Artery Disease; Deep Vein Thrombosis; Hypotension; Myocardial Infarction; Peripheral Arterial Disease; Peripheral Venous Disease; Phlebitis; Vasculitis Gastrointestinal Medical History: Negative for: Cirrhosis ; Colitis; Crohnos; Hepatitis A; Hepatitis B; Hepatitis C Endocrine Medical History: Positive for: Type II Diabetes Barnaby, Johnice ([id number]) Treated with: Insulin Immunological Medical History: Negative for: Lupus Erythematosus; Raynaudos; Scleroderma Musculoskeletal Medical History: Negative for: Gout; Rheumatoid Arthritis; Osteoarthritis; Osteomyelitis Neurologic Medical History: Negative for: Dementia; Neuropathy Oncologic Medical History: Negative for: Received Chemotherapy; Received Radiation HBO Extended History Items Eyes: Cataracts Immunizations Pneumococcal Vaccine: Received Pneumococcal Vaccination: Yes Immunization Notes: up to date Implantable Devices Family and Social History Cancer: Yes - Child; Diabetes: No; Heart Disease: Yes - Mother; Hereditary Spherocytosis: No; Hypertension: No; Kidney Disease: No; Lung Disease: No; Seizures: No; Stroke: No; Thyroid Problems: No; Tuberculosis: No; Former smoker - quit 60 years ago; Marital Status - Widowed; Alcohol Use: Never; Drug Use: No History; Caffeine Use: Daily; Financial Concerns: No; Food, Clothing or Shelter Needs: No; Support System Lacking: No; Transportation  Concerns: No; Advanced Directives: No; Patient does not want information on Advanced Directives Physician Affirmation I have reviewed and agree with the above information. Electronic  Signature(s) Signed: 05/27/2017 4:35:05 PM By: Evlyn Kanner MD, FACS Signed: 05/27/2017 4:42:33 PM By: Curtis Sites Entered By: Evlyn Kanner on 05/27/2017 10:46:19 FARIDA, MCREYNOLDS ([id number]) -------------------------------------------------------------------------------- SuperBill Details Patient Name: [name] [name] Date of Service: 05/27/2017 Medical Record Number: [id number] Patient Account Number: [id number] Date of Birth/Sex: [birth year] (83 y.o. Female) Treating RN: Curtis Sites Primary Care Provider: Barbette Reichmann Other Clinician: Referring Provider: Barbette Reichmann Treating Provider/Extender: Rudene Re in Treatment: 1 Diagnosis Coding ICD-10 Codes Code Description E11.622 Type 2 diabetes mellitus with other skin ulcer I89.0 Lymphedema, not elsewhere classified L97.222 Non-pressure chronic ulcer of left calf with fat layer exposed I50.22 Chronic systolic (congestive) heart failure N18.3 Chronic kidney disease, stage 3 (moderate) Facility Procedures CPT4 Code: [id number] Description: 99213 - WOUND CARE VISIT-LEV 3 EST PT Modifier: Quantity: 1 Physician Procedures CPT4 Code: [id number] Description: 99213 - WC PHYS LEVEL 3 - EST PT ICD-10 Diagnosis Description I89.0 Lymphedema, not elsewhere classified E11.622 Type 2 diabetes mellitus with other skin ulcer L97.222 Non-pressure chronic ulcer of left calf with fat layer expo I50.22  Chronic systolic (congestive) heart failure Modifier: sed Quantity: 1 Electronic Signature(s) Signed: 05/27/2017 10:55:26 AM By: Curtis Sites Signed: 05/27/2017 4:35:05 PM By: Evlyn Kanner MD, FACS Previous Signature: 05/27/2017 10:48:49 AM Version By: Evlyn Kanner MD, FACS Entered By: Curtis Sites on 05/27/2017 10:55:26

## 2017-06-01 ENCOUNTER — Ambulatory Visit (INDEPENDENT_AMBULATORY_CARE_PROVIDER_SITE_OTHER): Payer: Medicare Other

## 2017-06-01 DIAGNOSIS — R0989 Other specified symptoms and signs involving the circulatory and respiratory systems: Secondary | ICD-10-CM

## 2017-06-01 DIAGNOSIS — L97929 Non-pressure chronic ulcer of unspecified part of left lower leg with unspecified severity: Secondary | ICD-10-CM

## 2017-06-03 ENCOUNTER — Encounter: Payer: Medicare Other | Admitting: Surgery

## 2017-06-03 DIAGNOSIS — E11622 Type 2 diabetes mellitus with other skin ulcer: Secondary | ICD-10-CM | POA: Diagnosis not present

## 2017-06-04 NOTE — Progress Notes (Addendum)
REIGHN, KAPLAN ([id number]) Visit Report for 06/03/2017 Chief Complaint Document Details Patient Name: [name] [name], [name] [name] Date of Service: 06/03/2017 10:15 AM Medical Record Number: [id number] Patient Account Number: [id number] Date of Birth/Sex: 1926/01/23 (83 y.o. Female) Treating RN: Curtis Sites Primary Care Provider: Barbette Reichmann Other Clinician: Referring Provider: Barbette Reichmann Treating Provider/Extender: Rudene Re in Treatment: 2 Information Obtained from: Patient Chief Complaint Patient presents to the wound care center for a consult due non healing wound to the left lower extremity with bilateral massive swelling of the legs which she's had for about 4 months Electronic Signature(s) Signed: 06/03/2017 11:13:34 AM By: Evlyn Kanner MD, FACS Entered By: Evlyn Kanner on 06/03/2017 11:13:33 [name] [name] ([id number]) -------------------------------------------------------------------------------- HPI Details Patient Name: [name] [name] Date of Service: 06/03/2017 10:15 AM Medical Record Number: [id number] Patient Account Number: [id number] Date of Birth/Sex: 15-Feb-1926 (83 y.o. Female) Treating RN: Curtis Sites Primary Care Provider: Barbette Reichmann Other Clinician: Referring Provider: Barbette Reichmann Treating Provider/Extender: Rudene Re in Treatment: 2 History of Present Illness Location: bilateral swelling of the legs associated with ulceration on the left lower extremity Quality: Patient reports experiencing a dull pain to affected area(s). Severity: Patient states wound are getting worse. Duration: Patient has had the wound for > 3 months prior to seeking treatment at the wound center Timing: Pain in wound is Intermittent (comes and goes Context: The wound would happen gradually Modifying Factors: Other treatment(s) tried include:local care as per the PCP Associated Signs and Symptoms: Patient reports having increase  swelling. HPI Description: 83 year old patient seen by her PCP and referred to our center for left lower extremity stasis ulceration. She is known to have diabetes mellitus type 2, hypertension,status post cholecystectomy and open heart surgery, and atrial fibrillation and has bilateral lower extremity lymphedema with oozing of fluid. Her most recent hemoglobin A1c was 7.2. after assessment by the PCP she was put on torsemide 20 mg daily, echo was noted to have an ejection fraction of more than 55% with moderate MR, and the patient was maintained on warfarin for atrial fibrillation. the patient has been living in [place] for about a year but has been traveling a bit and has not had any arterial or venous studies done recently. Her last echo was about a year and a half ago. 05/27/2017 -- she has not had any dressing changes since the last time she was here as a home health did not turn up. Her arterial duplex study is scheduled for next week. 06/03/2017 -- -- had a lower extremity arterial study done and there was arterial wall calcification and the resting ABI was noncompressible bilaterally. The right and left toe brachial indices were abnormal with the left first toe pressure being 76 mmHg and the right toe pressure was 89 mmHg. The right toe brachial index was 0.55 and the left toe brachial index was 0.47. Electronic Signature(s) Signed: 06/03/2017 11:13:40 AM By: Evlyn Kanner MD, FACS Previous Signature: 06/03/2017 10:27:39 AM Version By: Evlyn Kanner MD, FACS Entered By: Evlyn Kanner on 06/03/2017 11:13:40 [name] [name], JAGER ([id number]) -------------------------------------------------------------------------------- Physical Exam Details Patient Name: [name] [name] Date of Service: 06/03/2017 10:15 AM Medical Record Number: [id number] Patient Account Number: [id number] Date of Birth/Sex: [birth year] (83 y.o. Female) Treating RN: Curtis Sites Primary Care Provider: Barbette Reichmann Other Clinician: Referring Provider: Barbette Reichmann Treating Provider/Extender: Rudene Re in Treatment: 2 Constitutional . Pulse regular. Respirations normal and unlabored. Afebrile. . Eyes Nonicteric. Reactive to light. Ears, Nose, Mouth, and Throat Lips, teeth, and gums WNL.Marland Kitchen Moist  mucosa without lesions. Neck supple and nontender. No palpable supraclavicular or cervical adenopathy. Normal sized without goiter. Respiratory WNL. No retractions.. Cardiovascular Pedal Pulses WNL. No clubbing, cyanosis or edema. Lymphatic No adneopathy. No adenopathy. No adenopathy. Musculoskeletal Adexa without tenderness or enlargement.. Digits and nails w/o clubbing, cyanosis, infection, petechiae, ischemia, or inflammatory conditions.. Integumentary (Hair, Skin) No suspicious lesions. No crepitus or fluctuance. No peri-wound warmth or erythema. No masses.Marland Kitchen Psychiatric Judgement and insight Intact.. No evidence of depression, anxiety, or agitation.. Notes the lymphedema bilaterally persist in the left lower extremity had a small open wound which I washed out with moist saline gauze and the debris came out easily. There is no evidence of significant lymph oozing out. Electronic Signature(s) Signed: 06/03/2017 11:14:26 AM By: Evlyn Kanner MD, FACS Entered By: Evlyn Kanner on 06/03/2017 11:14:25 [name] [name] ([id number]) -------------------------------------------------------------------------------- Physician Orders Details Patient Name: [name] [name] Date of Service: 06/03/2017 10:15 AM Medical Record Number: [id number] Patient Account Number: [id number] Date of Birth/Sex: 05-19-1926 (83 y.o. Female) Treating RN: Curtis Sites Primary Care Provider: Barbette Reichmann Other Clinician: Referring Provider: Barbette Reichmann Treating Provider/Extender: Rudene Re in Treatment: 2 Verbal / Phone Orders: No Diagnosis Coding Wound Cleansing Wound #1  Left,Posterior Lower Leg o Clean wound with Normal Saline. o May Shower, gently pat wound dry prior to applying new dressing. Anesthetic (add to Medication List) Wound #1 Left,Posterior Lower Leg o Topical Lidocaine 4% cream applied to wound bed prior to debridement (In Clinic Only). Primary Wound Dressing Wound #1 Left,Posterior Lower Leg o Silvercel Non-Adherent Secondary Dressing Wound #1 Left,Posterior Lower Leg o ABD pad o XtraSorb - if needed for drainage Dressing Change Frequency Wound #1 Left,Posterior Lower Leg o Change Dressing Monday, Wednesday, Friday Follow-up Appointments o Return Appointment in 1 week. Edema Control Wound #1 Left,Posterior Lower Leg o Kerlix and Coban - Left Lower Extremity - lightly from toes to 3cm below the knee - may anchor with unna wrap if needed Additional Orders / Instructions Wound #1 Left,Posterior Lower Leg o Increase protein intake. o Other: - Please try to keep blood sugars below 180. Please add over the counter vitamin C, multivitamin and zinc supplements to your diet. Home Health Wound #1 Left,Posterior Lower Leg o Continue Home Health Visits o Home Health Nurse may visit PRN to address patientos wound care needs. o FACE TO FACE ENCOUNTER: MEDICARE and MEDICAID PATIENTS: I certify that this patient is under my care and that I had a face-to-face encounter that meets the physician face-to-face encounter requirements with this BAYLEA, MILBURN ([id number]) patient on this date. The encounter with the patient was in whole or in part for the following MEDICAL CONDITION: (primary reason for Home Healthcare) MEDICAL NECESSITY: I certify, that based on my findings, NURSING services are a medically necessary home health service. HOME BOUND STATUS: I certify that my clinical findings support that this patient is homebound (i.e., Due to illness or injury, pt requires aid of supportive devices such as crutches, cane,  wheelchairs, walkers, the use of special transportation or the assistance of another person to leave their place of residence. There is a normal inability to leave the home and doing so requires considerable and taxing effort. Other absences are for medical reasons / religious services and are infrequent or of short duration when for other reasons). o If current dressing causes regression in wound condition, may D/C ordered dressing product/s and apply Normal Saline Moist Dressing daily until next Wound Healing Center / Other MD appointment. Notify Wound Healing Center  of regression in wound condition at [phone]. o Please direct any NON-WOUND related issues/requests for orders to patient's Primary Care Physician Electronic Signature(s) Signed: 06/03/2017 12:55:29 PM By: Evlyn Kanner MD, FACS Signed: 06/03/2017 5:07:10 PM By: Curtis Sites Entered By: Curtis Sites on 06/03/2017 10:58:11 [name] [name] ([id number]) -------------------------------------------------------------------------------- Problem List Details Patient Name: [name] [name] Date of Service: 06/03/2017 10:15 AM Medical Record Number: [id number] Patient Account Number: [id number] Date of Birth/Sex: June 30, 1925 (83 y.o. Female) Treating RN: Curtis Sites Primary Care Provider: Barbette Reichmann Other Clinician: Referring Provider: Barbette Reichmann Treating Provider/Extender: Rudene Re in Treatment: 2 Active Problems ICD-10 Encounter Code Description Active Date Diagnosis E11.622 Type 2 diabetes mellitus with other skin ulcer 05/19/2017 Yes I89.0 Lymphedema, not elsewhere classified 05/19/2017 Yes L97.222 Non-pressure chronic ulcer of left calf with fat layer exposed 05/19/2017 Yes I50.22 Chronic systolic (congestive) heart failure 05/19/2017 Yes N18.3 Chronic kidney disease, stage 3 (moderate) 05/19/2017 Yes Inactive Problems Resolved Problems Electronic Signature(s) Signed: 06/03/2017  11:13:24 AM By: Evlyn Kanner MD, FACS Entered By: Evlyn Kanner on 06/03/2017 11:13:23 [name] [name] ([id number]) -------------------------------------------------------------------------------- Progress Note Details Patient Name: [name] [name] Date of Service: 06/03/2017 10:15 AM Medical Record Number: [id number] Patient Account Number: [id number] Date of Birth/Sex: August 17, 1925 (83 y.o. Female) Treating RN: Curtis Sites Primary Care Provider: Barbette Reichmann Other Clinician: Referring Provider: Barbette Reichmann Treating Provider/Extender: Rudene Re in Treatment: 2 Subjective Chief Complaint Information obtained from Patient Patient presents to the wound care center for a consult due non healing wound to the left lower extremity with bilateral massive swelling of the legs which she's had for about 4 months History of Present Illness (HPI) The following HPI elements were documented for the patient's wound: Location: bilateral swelling of the legs associated with ulceration on the left lower extremity Quality: Patient reports experiencing a dull pain to affected area(s). Severity: Patient states wound are getting worse. Duration: Patient has had the wound for > 3 months prior to seeking treatment at the wound center Timing: Pain in wound is Intermittent (comes and goes Context: The wound would happen gradually Modifying Factors: Other treatment(s) tried include:local care as per the PCP Associated Signs and Symptoms: Patient reports having increase swelling. 83 year old patient seen by her PCP and referred to our center for left lower extremity stasis ulceration. She is known to have diabetes mellitus type 2, hypertension,status post cholecystectomy and open heart surgery, and atrial fibrillation and has bilateral lower extremity lymphedema with oozing of fluid. Her most recent hemoglobin A1c was 7.2. after assessment by the PCP she was put on torsemide 20 mg daily,  echo was noted to have an ejection fraction of more than 55% with moderate MR, and the patient was maintained on warfarin for atrial fibrillation. the patient has been living in [place] for about a year but has been traveling a bit and has not had any arterial or venous studies done recently. Her last echo was about a year and a half ago. 05/27/2017 -- she has not had any dressing changes since the last time she was here as a home health did not turn up. Her arterial duplex study is scheduled for next week. 06/03/2017 -- -- had a lower extremity arterial study done and there was arterial wall calcification and the resting ABI was noncompressible bilaterally. The right and left toe brachial indices were abnormal with the left first toe pressure being 76 mmHg and the right toe pressure was 89 mmHg. The right toe brachial index was 0.55 and the left toe  brachial index was 0.47. Patient History Information obtained from Patient. Family History Cancer - Child, Heart Disease - Mother, No family history of Diabetes, Hereditary Spherocytosis, Hypertension, Kidney Disease, Lung Disease, Seizures, Stroke, Thyroid Problems, Tuberculosis. Social History Former smoker - quit 60 years ago, Marital Status - Widowed, Alcohol Use - Never, Drug Use - No History, Caffeine Use - Daily. LANEISHA, [name] [name] ([id number]) Medical And Surgical History Notes Eyes macular degeneration Objective Constitutional Pulse regular. Respirations normal and unlabored. Afebrile. Vitals Time Taken: 10:33 AM, Height: 63 in, Weight: 212 lbs, BMI: 37.6, Temperature: 97.9 F, Pulse: 77 bpm, Respiratory Rate: 20 breaths/min, Blood Pressure: 103/57 mmHg. Eyes Nonicteric. Reactive to light. Ears, Nose, Mouth, and Throat Lips, teeth, and gums WNL.Marland Kitchen Moist mucosa without lesions. Neck supple and nontender. No palpable supraclavicular or cervical adenopathy. Normal sized without goiter. Respiratory WNL. No  retractions.. Cardiovascular Pedal Pulses WNL. No clubbing, cyanosis or edema. Lymphatic No adneopathy. No adenopathy. No adenopathy. Musculoskeletal Adexa without tenderness or enlargement.. Digits and nails w/o clubbing, cyanosis, infection, petechiae, ischemia, or inflammatory conditions.Marland Kitchen Psychiatric Judgement and insight Intact.. No evidence of depression, anxiety, or agitation.. General Notes: the lymphedema bilaterally persist in the left lower extremity had a small open wound which I washed out with moist saline gauze and the debris came out easily. There is no evidence of significant lymph oozing out. Integumentary (Hair, Skin) No suspicious lesions. No crepitus or fluctuance. No peri-wound warmth or erythema. No masses.. Wound #1 status is Open. Original cause of wound was Blister. The wound is located on the Left,Posterior Lower Leg. The wound measures 0.5cm length x 0.4cm width x 0.1cm depth; 0.157cm^2 area and 0.016cm^3 volume. There is no undermining noted. There is a large amount of serous drainage noted. The wound margin is flat and intact. There is no granulation within the wound bed. There is a large (67-100%) amount of necrotic tissue within the wound bed including Raynham, Skarlet ([id number]) Adherent Slough. The periwound skin appearance did not exhibit: Callus, Crepitus, Excoriation, Induration, Rash, Scarring, Dry/Scaly, Maceration, Atrophie Blanche, Cyanosis, Ecchymosis, Hemosiderin Staining, Mottled, Pallor, Rubor, Erythema. Periwound temperature was noted as No Abnormality. The periwound has tenderness on palpation. Assessment Active Problems ICD-10 E11.622 - Type 2 diabetes mellitus with other skin ulcer I89.0 - Lymphedema, not elsewhere classified L97.222 - Non-pressure chronic ulcer of left calf with fat layer exposed I50.22 - Chronic systolic (congestive) heart failure N18.3 - Chronic kidney disease, stage 3 (moderate) Plan Wound Cleansing: Wound #1  Left,Posterior Lower Leg: Clean wound with Normal Saline. May Shower, gently pat wound dry prior to applying new dressing. Anesthetic (add to Medication List): Wound #1 Left,Posterior Lower Leg: Topical Lidocaine 4% cream applied to wound bed prior to debridement (In Clinic Only). Primary Wound Dressing: Wound #1 Left,Posterior Lower Leg: Silvercel Non-Adherent Secondary Dressing: Wound #1 Left,Posterior Lower Leg: ABD pad XtraSorb - if needed for drainage Dressing Change Frequency: Wound #1 Left,Posterior Lower Leg: Change Dressing Monday, Wednesday, Friday Follow-up Appointments: Return Appointment in 1 week. Edema Control: Wound #1 Left,Posterior Lower Leg: Kerlix and Coban - Left Lower Extremity - lightly from toes to 3cm below the knee - may anchor with unna wrap if needed Additional Orders / Instructions: Wound #1 Left,Posterior Lower Leg: Increase protein intake. Other: - Please try to keep blood sugars below 180. Please add over the counter vitamin C, multivitamin and zinc supplements to your diet. Home Health: Wound #1 Left,Posterior Lower Leg: Continue Home Health Visits Home Health Nurse may visit PRN to address patient s  wound care needs. FACE TO FACE ENCOUNTER: MEDICARE and MEDICAID PATIENTS: I certify that this patient is under my care and that I had a face-to-face encounter that meets the physician face-to-face encounter requirements with this patient on this date. The [name] BudSIEGEL, Jamacia ([id number]) encounter with the patient was in whole or in part for the following MEDICAL CONDITION: (primary reason for Home Healthcare) MEDICAL NECESSITY: I certify, that based on my findings, NURSING services are a medically necessary home health service. HOME BOUND STATUS: I certify that my clinical findings support that this patient is homebound (i.e., Due to illness or injury, pt requires aid of supportive devices such as crutches, cane, wheelchairs, walkers, the use of  special transportation or the assistance of another person to leave their place of residence. There is a normal inability to leave the home and doing so requires considerable and taxing effort. Other absences are for medical reasons / religious services and are infrequent or of short duration when for other reasons). If current dressing causes regression in wound condition, may D/C ordered dressing product/s and apply Normal Saline Moist Dressing daily until next Wound Healing Center / Other MD appointment. Notify Wound Healing Center of regression in wound condition at [phone]. Please direct any NON-WOUND related issues/requests for orders to patient's Primary Care Physician After review and a thorough discussion, I have recommended: 1. Silver alginate and a light Kerlix and Coban dressing to be applied to the left lower extremity, to be changed 3 times a week 2. Will use her own compression stockings on the right lower extremity 3. Elevation and exercise has been discussed in great detail 4. arterial duplex study -- results reviewed with the family 5. Vitamin A, vitamin C and zinc. Adequate amount of protein 6. Good control of her diabetes mellitus Electronic Signature(s) Signed: 06/03/2017 11:15:14 AM By: Evlyn KannerBritto, Deziyah Arvin MD, FACS Entered By: Evlyn KannerBritto, Jeshurun [place] on 06/03/2017 11:15:14 [name] BudSIEGEL, Daksha ([id number]) -------------------------------------------------------------------------------- ROS/PFSH Details Patient Name: [name] BudSIEGEL, [name] [name] Date of Service: 06/03/2017 10:15 AM Medical Record Number: [id number] Patient Account Number: [id number] Date of Birth/Sex: 1925-12-08 (83 y.o. Female) Treating RN: Curtis Sitesorthy, Joanna Primary Care Provider: Barbette ReichmannHande, Vishwanath Other Clinician: Referring Provider: Barbette ReichmannHande, Vishwanath Treating Provider/Extender: Rudene ReBritto, Jolayne Branson Weeks in Treatment: 2 Information Obtained From Patient Wound History Do you currently have one or more open woundso Yes How many open  wounds do you currently haveo 2 Approximately how long have you had your woundso 5 months How have you been treating your wound(s) until nowo camphil Has your wound(s) ever healed and then re-openedo No Have you had any lab work done in the past montho No Have you tested positive for an antibiotic resistant organism (MRSA, VRE)o No Have you tested positive for osteomyelitis (bone infection)o No Have you had any tests for circulation on your legso No Have you had other problems associated with your woundso Swelling Eyes Medical History: Positive for: Cataracts - removed Past Medical History Notes: macular degeneration Hematologic/Lymphatic Medical History: Positive for: Anemia Negative for: Hemophilia; Human Immunodeficiency Virus; Lymphedema; Sickle Cell Disease Respiratory Medical History: Negative for: Aspiration; Asthma; Chronic Obstructive Pulmonary Disease (COPD); Pneumothorax; Sleep Apnea; Tuberculosis Cardiovascular Medical History: Positive for: Arrhythmia - a fib; Congestive Heart Failure; Hypertension Negative for: Angina; Coronary Artery Disease; Deep Vein Thrombosis; Hypotension; Myocardial Infarction; Peripheral Arterial Disease; Peripheral Venous Disease; Phlebitis; Vasculitis Gastrointestinal Medical History: Negative for: Cirrhosis ; Colitis; Crohnos; Hepatitis A; Hepatitis B; Hepatitis C Endocrine Medical History: Positive for: Type II Diabetes Scifres, Mackenzye ([id number]) Treated with: Insulin Immunological Medical History: Negative  for: Lupus Erythematosus; Raynaudos; Scleroderma Musculoskeletal Medical History: Negative for: Gout; Rheumatoid Arthritis; Osteoarthritis; Osteomyelitis Neurologic Medical History: Negative for: Dementia; Neuropathy Oncologic Medical History: Negative for: Received Chemotherapy; Received Radiation HBO Extended History Items Eyes: Cataracts Immunizations Pneumococcal Vaccine: Received Pneumococcal Vaccination:  Yes Immunization Notes: up to date Implantable Devices Family and Social History Cancer: Yes - Child; Diabetes: No; Heart Disease: Yes - Mother; Hereditary Spherocytosis: No; Hypertension: No; Kidney Disease: No; Lung Disease: No; Seizures: No; Stroke: No; Thyroid Problems: No; Tuberculosis: No; Former smoker - quit 60 years ago; Marital Status - Widowed; Alcohol Use: Never; Drug Use: No History; Caffeine Use: Daily; Financial Concerns: No; Food, Clothing or Shelter Needs: No; Support System Lacking: No; Transportation Concerns: No; Advanced Directives: No; Patient does not want information on Advanced Directives Physician Affirmation I have reviewed and agree with the above information. Electronic Signature(s) Signed: 06/03/2017 12:55:29 PM By: Evlyn KannerBritto, Burnie Hank MD, FACS Signed: 06/03/2017 5:07:10 PM By: Curtis Sitesorthy, Joanna Entered By: Evlyn KannerBritto, Honesty Menta on 06/03/2017 11:13:47 [name] BudSIEGEL, [name] [name] ([id number]) -------------------------------------------------------------------------------- SuperBill Details Patient Name: [name] BudSIEGEL, Amandajo Date of Service: 06/03/2017 Medical Record Number: [id number] Patient Account Number: [id number] Date of Birth/Sex: Jun 18, 1926 (83 y.o. Female) Treating RN: Curtis Sitesorthy, Joanna Primary Care Provider: Barbette ReichmannHande, Vishwanath Other Clinician: Referring Provider: Barbette ReichmannHande, Vishwanath Treating Provider/Extender: Rudene ReBritto, Raj Landress Weeks in Treatment: 2 Diagnosis Coding ICD-10 Codes Code Description E11.622 Type 2 diabetes mellitus with other skin ulcer I89.0 Lymphedema, not elsewhere classified L97.222 Non-pressure chronic ulcer of left calf with fat layer exposed I50.22 Chronic systolic (congestive) heart failure N18.3 Chronic kidney disease, stage 3 (moderate) Facility Procedures CPT4 Code: [id number] Description: 99213 - WOUND CARE VISIT-LEV 3 EST PT Modifier: Quantity: 1 Physician Procedures CPT4 Code: [id number] Description: 99213 - WC PHYS LEVEL 3 - EST PT ICD-10 Diagnosis  Description E11.622 Type 2 diabetes mellitus with other skin ulcer I89.0 Lymphedema, not elsewhere classified L97.222 Non-pressure chronic ulcer of left calf with fat layer expo I50.22  Chronic systolic (congestive) heart failure Modifier: sed Quantity: 1 Electronic Signature(s) Signed: 06/03/2017 4:44:45 PM By: Curtis Sitesorthy, Joanna Signed: 06/03/2017 4:49:12 PM By: Evlyn KannerBritto, Rommel Hogston MD, FACS Previous Signature: 06/03/2017 11:15:32 AM Version By: Evlyn KannerBritto, Shakeeta Godette MD, FACS Entered By: Curtis Sitesorthy, Joanna on 06/03/2017 16:44:44

## 2017-06-05 NOTE — Progress Notes (Signed)
[name], [name] ([id number]) Visit Report for 06/03/2017 Arrival Information Details Patient Name: [name] [name], [name] [name] Date of Service: 06/03/2017 10:15 AM Medical Record Number: [id number] Patient Account Number: [id number] Date of Birth/Sex: 1925/11/21 (83 y.o. Female) Treating RN: Ashok Cordia, Debi Primary Care Roxine Whittinghill: Barbette Reichmann Other Clinician: Referring Vaishali Baise: Barbette Reichmann Treating Yaniris Braddock/Extender: Rudene Re in Treatment: 2 Visit Information History Since Last Visit All ordered tests and consults were completed: No Patient Arrived: Gilmer Mor Added or deleted any medications: No Arrival Time: 10:33 Any new allergies or adverse reactions: No Accompanied By: daughter Had a fall or experienced change in No Transfer Assistance: EasyPivot Patient activities of daily living that may affect Lift risk of falls: Patient Identification Verified: Yes Signs or symptoms of abuse/neglect since last visito No Secondary Verification Process Yes Hospitalized since last visit: No Completed: Has Dressing in Place as Prescribed: Yes Patient Requires Transmission-Based No Precautions: Has Compression in Place as Prescribed: Yes Patient Has Alerts: Yes Pain Present Now: No Patient Alerts: Patient on Blood Thinner DMII warfarin Electronic Signature(s) Signed: 06/03/2017 4:53:17 PM By: Alejandro Mulling Entered By: Alejandro Mulling on 06/03/2017 10:33:41 [name] [name] ([id number]) -------------------------------------------------------------------------------- Clinic Level of Care Assessment Details Patient Name: [name] [name] Date of Service: 06/03/2017 10:15 AM Medical Record Number: [id number] Patient Account Number: [id number] Date of Birth/Sex: 1926/04/15 (83 y.o. Female) Treating RN: Curtis Sites Primary Care Abran Gavigan: Barbette Reichmann Other Clinician: Referring Faruq Rosenberger: Barbette Reichmann Treating Avishai Reihl/Extender: Rudene Re in Treatment:  2 Clinic Level of Care Assessment Items TOOL 4 Quantity Score []  - Use when only an EandM is performed on FOLLOW-UP visit 0 ASSESSMENTS - Nursing Assessment / Reassessment X - Reassessment of Co-morbidities (includes updates in patient status) 1 10 X- 1 5 Reassessment of Adherence to Treatment Plan ASSESSMENTS - Wound and Skin Assessment / Reassessment X - Simple Wound Assessment / Reassessment - one wound 1 5 []  - 0 Complex Wound Assessment / Reassessment - multiple wounds []  - 0 Dermatologic / Skin Assessment (not related to wound area) ASSESSMENTS - Focused Assessment []  - Circumferential Edema Measurements - multi extremities 0 []  - 0 Nutritional Assessment / Counseling / Intervention X- 1 5 Lower Extremity Assessment (monofilament, tuning fork, pulses) []  - 0 Peripheral Arterial Disease Assessment (using hand held doppler) ASSESSMENTS - Ostomy and/or Continence Assessment and Care []  - Incontinence Assessment and Management 0 []  - 0 Ostomy Care Assessment and Management (repouching, etc.) PROCESS - Coordination of Care X - Simple Patient / Family Education for ongoing care 1 15 []  - 0 Complex (extensive) Patient / Family Education for ongoing care []  - 0 Staff obtains Chiropractor, Records, Test Results / Process Orders []  - 0 Staff telephones HHA, Nursing Homes / Clarify orders / etc []  - 0 Routine Transfer to another Facility (non-emergent condition) []  - 0 Routine Hospital Admission (non-emergent condition) []  - 0 New Admissions / Manufacturing engineer / Ordering NPWT, Apligraf, etc. []  - 0 Emergency Hospital Admission (emergent condition) X- 1 10 Simple Discharge Coordination Melikian, Xuan ([id number]) []  - 0 Complex (extensive) Discharge Coordination PROCESS - Special Needs []  - Pediatric / Minor Patient Management 0 []  - 0 Isolation Patient Management []  - 0 Hearing / Language / Visual special needs []  - 0 Assessment of Community assistance  (transportation, D/C planning, etc.) []  - 0 Additional assistance / Altered mentation []  - 0 Support Surface(s) Assessment (bed, cushion, seat, etc.) INTERVENTIONS - Wound Cleansing / Measurement X - Simple Wound Cleansing - one wound 1 5 []  - 0 Complex Wound Cleansing -  multiple wounds X- 1 5 Wound Imaging (photographs - any number of wounds) []  - 0 Wound Tracing (instead of photographs) X- 1 5 Simple Wound Measurement - one wound []  - 0 Complex Wound Measurement - multiple wounds INTERVENTIONS - Wound Dressings []  - Small Wound Dressing one or multiple wounds 0 []  - 0 Medium Wound Dressing one or multiple wounds X- 1 20 Large Wound Dressing one or multiple wounds []  - 0 Application of Medications - topical []  - 0 Application of Medications - injection INTERVENTIONS - Miscellaneous []  - External ear exam 0 []  - 0 Specimen Collection (cultures, biopsies, blood, body fluids, etc.) []  - 0 Specimen(s) / Culture(s) sent or taken to Lab for analysis []  - 0 Patient Transfer (multiple staff / Nurse, adultHoyer Lift / Similar devices) []  - 0 Simple Staple / Suture removal (25 or less) []  - 0 Complex Staple / Suture removal (26 or more) []  - 0 Hypo / Hyperglycemic Management (close monitor of Blood Glucose) []  - 0 Ankle / Brachial Index (ABI) - do not check if billed separately X- 1 5 Vital Signs Langhans, Mateja ([id number]) Has the patient been seen at the hospital within the last three years: Yes Total Score: 90 Level Of Care: New/Established - Level 3 Electronic Signature(s) Signed: 06/03/2017 5:07:10 PM By: Curtis Sitesorthy, Joanna Entered By: Curtis Sitesorthy, Joanna on 06/03/2017 16:44:34 [name] [name], Merelin ([id number]) -------------------------------------------------------------------------------- Encounter Discharge Information Details Patient Name: [name] [name], [name] Date of Service: 06/03/2017 10:15 AM Medical Record Number: [id number] Patient Account Number: [id number] Date of Birth/Sex:  1925-08-04 (83 y.o. Female) Treating RN: Curtis Sitesorthy, Joanna Primary Care Lataya Varnell: Barbette ReichmannHande, Vishwanath Other Clinician: Referring Jacob Chamblee: Barbette ReichmannHande, Vishwanath Treating Genevieve Arbaugh/Extender: Rudene ReBritto, Errol Weeks in Treatment: 2 Encounter Discharge Information Items Discharge Pain Level: 0 Discharge Condition: Stable Ambulatory Status: Ambulatory Discharge Destination: Home Transportation: Private Auto Accompanied By: dtr Schedule Follow-up Appointment: Yes Medication Reconciliation completed and No provided to Patient/Care Khaya Theissen: Provided on Clinical Summary of Care: 06/03/2017 Form Type Recipient Paper Patient MS Electronic Signature(s) Signed: 06/03/2017 4:45:35 PM By: Curtis Sitesorthy, Joanna Entered By: Curtis Sitesorthy, Joanna on 06/03/2017 16:45:35 [name] [name], Katlynn ([id number]) -------------------------------------------------------------------------------- Lower Extremity Assessment Details Patient Name: [name] [name], Briggitte Date of Service: 06/03/2017 10:15 AM Medical Record Number: [id number] Patient Account Number: [id number] Date of Birth/Sex: 1925-08-04 (83 y.o. Female) Treating RN: Ashok CordiaPinkerton, Debi Primary Care Ceferino Lang: Barbette ReichmannHande, Vishwanath Other Clinician: Referring Joab Carden: Barbette ReichmannHande, Vishwanath Treating Raven Furnas/Extender: Rudene ReBritto, Errol Weeks in Treatment: 2 Edema Assessment Assessed: [Left: No] [Right: No] [Left: Edema] [Right: :] Calf Left: Right: Point of Measurement: 32 cm From Medial Instep 43.4 cm cm Ankle Left: Right: Point of Measurement: 9 cm From Medial Instep 27 cm cm Vascular Assessment Pulses: Dorsalis Pedis Palpable: [Left:Yes] Posterior Tibial Extremity colors, hair growth, and conditions: Extremity Color: [Left:Hyperpigmented] Temperature of Extremity: [Left:Warm] Capillary Refill: [Left:< 3 seconds] Toe Nail Assessment Left: Right: Thick: Yes Discolored: Yes Deformed: Yes Improper Length and Hygiene: Yes Electronic Signature(s) Signed: 06/03/2017 4:53:17 PM By:  Alejandro MullingPinkerton, Debra Entered By: Alejandro MullingPinkerton, Debra on 06/03/2017 10:40:49 [name] [name], Kodie ([id number]) -------------------------------------------------------------------------------- Multi Wound Chart Details Patient Name: [name] [name], Maleeyah Date of Service: 06/03/2017 10:15 AM Medical Record Number: [id number] Patient Account Number: [id number] Date of Birth/Sex: 1925-08-04 (83 y.o. Female) Treating RN: Phillis HaggisPinkerton, Debi Primary Care Bravery Ketcham: Barbette ReichmannHande, Vishwanath Other Clinician: Referring Stefanie Hodgens: Barbette ReichmannHande, Vishwanath Treating Jp Eastham/Extender: Rudene ReBritto, Errol Weeks in Treatment: 2 Vital Signs Height(in): 63 Pulse(bpm): 77 Weight(lbs): 212 Blood Pressure(mmHg): 103/57 Body Mass Index(BMI): 38 Temperature(F): 97.9 Respiratory Rate 20 (breaths/min): Photos: [1:No Photos] [N/A:N/A] Wound Location: [1:Left Lower Leg - Posterior] [N/A:N/A] Wounding Event: [  1:Blister] [N/A:N/A] Primary Etiology: [1:Diabetic Wound/Ulcer of the Lower Extremity] [N/A:N/A] Secondary Etiology: [1:Lymphedema] [N/A:N/A] Comorbid History: [1:Cataracts, Anemia, Arrhythmia, Congestive Heart Failure, Hypertension, Type II Diabetes] [N/A:N/A] Date Acquired: [1:12/28/2016] [N/A:N/A] Weeks of Treatment: [1:2] [N/A:N/A] Wound Status: [1:Open] [N/A:N/A] Measurements L x W x D [1:0.5x0.4x0.1] [N/A:N/A] (cm) Area (cm) : [1:0.157] [N/A:N/A] Volume (cm) : [1:0.016] [N/A:N/A] % Reduction in Area: [1:33.50%] [N/A:N/A] % Reduction in Volume: [1:33.30%] [N/A:N/A] Classification: [1:Grade 1] [N/A:N/A] Exudate Amount: [1:Large] [N/A:N/A] Exudate Type: [1:Serous] [N/A:N/A] Exudate Color: [1:amber] [N/A:N/A] Wound Margin: [1:Flat and Intact] [N/A:N/A] Granulation Amount: [1:None Present (0%)] [N/A:N/A] Necrotic Amount: [1:Large (67-100%)] [N/A:N/A] Exposed Structures: [1:Fascia: No Fat Layer (Subcutaneous Tissue) Exposed: No Tendon: No Muscle: No Joint: No Bone: No] [N/A:N/A] Epithelialization: [1:None] [N/A:N/A] Periwound  Skin Texture: [1:Excoriation: No Induration: No Callus: No] [N/A:N/A] Crepitus: No Rash: No Scarring: No Periwound Skin Moisture: Maceration: No N/A N/A Dry/Scaly: No Periwound Skin Color: Atrophie Blanche: No N/A N/A Cyanosis: No Ecchymosis: No Erythema: No Hemosiderin Staining: No Mottled: No Pallor: No Rubor: No Temperature: No Abnormality N/A N/A Tenderness on Palpation: Yes N/A N/A Wound Preparation: Ulcer Cleansing: N/A N/A Rinsed/Irrigated with Saline, Other: soap and water Topical Anesthetic Applied: Other: lidocaine 4% Treatment Notes Electronic Signature(s) Signed: 06/03/2017 11:13:28 AM By: Evlyn KannerBritto, Errol MD, FACS Entered By: Evlyn KannerBritto, Errol on 06/03/2017 11:13:27 [name] [name], Jessamy ([id number]) -------------------------------------------------------------------------------- Multi-Disciplinary Care Plan Details Patient Name: [name] [name], Clemencia Date of Service: 06/03/2017 10:15 AM Medical Record Number: [id number] Patient Account Number: [id number] Date of Birth/Sex: 09-11-1925 (83 y.o. Female) Treating RN: Ashok CordiaPinkerton, Debi Primary Care Dayten Juba: Barbette ReichmannHande, Vishwanath Other Clinician: Referring Edenilson Austad: Barbette ReichmannHande, Vishwanath Treating Ghassan Coggeshall/Extender: Rudene ReBritto, Errol Weeks in Treatment: 2 Active Inactive ` Abuse / Safety / Falls / Self Care Management Nursing Diagnoses: Impaired physical mobility Goals: Patient will not experience any injury related to falls Date Initiated: 05/19/2017 Target Resolution Date: 07/30/2017 Goal Status: Active Interventions: Assess fall risk on admission and as needed Notes: ` Orientation to the Wound Care Program Nursing Diagnoses: Knowledge deficit related to the wound healing center program Goals: Patient/caregiver will verbalize understanding of the Wound Healing Center Program Date Initiated: 05/19/2017 Target Resolution Date: 07/30/2017 Goal Status: Active Interventions: Provide education on orientation to the wound  center Notes: ` Wound/Skin Impairment Nursing Diagnoses: Impaired tissue integrity Goals: Ulcer/skin breakdown will heal within 14 weeks Date Initiated: 05/19/2017 Target Resolution Date: 07/30/2017 Goal Status: Active Interventions: [name] [name], Simmone ([id number]) Assess patient/caregiver ability to obtain necessary supplies Assess patient/caregiver ability to perform ulcer/skin care regimen upon admission and as needed Assess ulceration(s) every visit Notes: Electronic Signature(s) Signed: 06/03/2017 4:53:17 PM By: Alejandro MullingPinkerton, Debra Entered By: Alejandro MullingPinkerton, Debra on 06/03/2017 10:41:05 [name] [name], Malachi ([id number]) -------------------------------------------------------------------------------- Pain Assessment Details Patient Name: [name] [name], Jessamy Date of Service: 06/03/2017 10:15 AM Medical Record Number: [id number] Patient Account Number: [id number] Date of Birth/Sex: 09-11-1925 (83 y.o. Female) Treating RN: Ashok CordiaPinkerton, Debi Primary Care Gianluca Chhim: Barbette ReichmannHande, Vishwanath Other Clinician: Referring Larra Crunkleton: Barbette ReichmannHande, Vishwanath Treating Trinidee Schrag/Extender: Rudene ReBritto, Errol Weeks in Treatment: 2 Active Problems Location of Pain Severity and Description of Pain Patient Has Paino No Site Locations Pain Management and Medication Current Pain Management: Electronic Signature(s) Signed: 06/03/2017 4:53:17 PM By: Alejandro MullingPinkerton, Debra Entered By: Alejandro MullingPinkerton, Debra on 06/03/2017 10:33:47 [name] [name], Meredeth ([id number]) -------------------------------------------------------------------------------- Patient/Caregiver Education Details Patient Name: [name] [name], Dennise Date of Service: 06/03/2017 10:15 AM Medical Record Number: [id number] Patient Account Number: [id number] Date of Birth/Gender: 09-11-1925 (83 y.o. Female) Treating RN: Curtis Sitesorthy, Joanna Primary Care Physician: Barbette ReichmannHande, Vishwanath Other Clinician: Referring Physician: Barbette ReichmannHande, Vishwanath Treating Physician/Extender: Rudene ReBritto, Errol Weeks in Treatment:  2  Education Assessment Education Provided To: Patient and Caregiver Education Topics Provided Wound/Skin Impairment: Handouts: Other: need for bilateral compression Methods: Explain/Verbal Responses: State content correctly Electronic Signature(s) Signed: 06/03/2017 5:07:10 PM By: Curtis Sites Entered By: Curtis Sites on 06/03/2017 16:46:44 [name] [name] ([id number]) -------------------------------------------------------------------------------- Wound Assessment Details Patient Name: [name] [name] Date of Service: 06/03/2017 10:15 AM Medical Record Number: [id number] Patient Account Number: [id number] Date of Birth/Sex: [birth year] (83 y.o. Female) Treating RN: Ashok Cordia, Debi Primary Care Deedra Pro: Barbette Reichmann Other Clinician: Referring Haroon Shatto: Barbette Reichmann Treating Rendell Thivierge/Extender: Rudene Re in Treatment: 2 Wound Status Wound Number: 1 Primary Diabetic Wound/Ulcer of the Lower Extremity Etiology: Wound Location: Left Lower Leg - Posterior Secondary Lymphedema Wounding Event: Blister Etiology: Date Acquired: 12/28/2016 Wound Status: Open Weeks Of Treatment: 2 Comorbid Cataracts, Anemia, Arrhythmia, Congestive Clustered Wound: No History: Heart Failure, Hypertension, Type II Diabetes Wound Measurements Length: (cm) 0.5 Width: (cm) 0.4 Depth: (cm) 0.1 Area: (cm) 0.157 Volume: (cm) 0.016 % Reduction in Area: 33.5% % Reduction in Volume: 33.3% Epithelialization: None Undermining: No Wound Description Classification: Grade 1 Wound Margin: Flat and Intact Exudate Amount: Large Exudate Type: Serous Exudate Color: amber Foul Odor After Cleansing: No Slough/Fibrino Yes Wound Bed Granulation Amount: None Present (0%) Exposed Structure Necrotic Amount: Large (67-100%) Fascia Exposed: No Necrotic Quality: Adherent Slough Fat Layer (Subcutaneous Tissue) Exposed: No Tendon Exposed: No Muscle Exposed: No Joint Exposed: No Bone  Exposed: No Periwound Skin Texture Texture Color No Abnormalities Noted: No No Abnormalities Noted: No Callus: No Atrophie Blanche: No Crepitus: No Cyanosis: No Excoriation: No Ecchymosis: No Induration: No Erythema: No Rash: No Hemosiderin Staining: No Scarring: No Mottled: No Pallor: No Moisture Rubor: No No Abnormalities Noted: No Dry / Scaly: No Temperature / Pain Maceration: No Temperature: No Abnormality Marzo, Eldora ([id number]) Tenderness on Palpation: Yes Wound Preparation Ulcer Cleansing: Rinsed/Irrigated with Saline, Other: soap and water, Topical Anesthetic Applied: Other: lidocaine 4%, Treatment Notes Wound #1 (Left, Posterior Lower Leg) 1. Cleansed with: Cleanse wound with antibacterial soap and water 2. Anesthetic Topical Lidocaine 4% cream to wound bed prior to debridement 4. Dressing Applied: Other dressing (specify in notes) 5. Secondary Dressing Applied ABD Pad 7. Secured with Other (specify in notes) Notes silvercel, kerlix and coban wrap, unna to Ecologist) Signed: 06/03/2017 4:53:17 PM By: Alejandro Mulling Entered By: Alejandro Mulling on 06/03/2017 10:39:02 [name] [name] ([id number]) -------------------------------------------------------------------------------- Vitals Details Patient Name: [name] [name] Date of Service: 06/03/2017 10:15 AM Medical Record Number: [id number] Patient Account Number: [id number] Date of Birth/Sex: January 31, 1926 (83 y.o. Female) Treating RN: Ashok Cordia, Debi Primary Care Linnae Rasool: Barbette Reichmann Other Clinician: Referring Abagael Kramm: Barbette Reichmann Treating Jorgia Manthei/Extender: Rudene Re in Treatment: 2 Vital Signs Time Taken: 10:33 Temperature (F): 97.9 Height (in): 63 Pulse (bpm): 77 Weight (lbs): 212 Respiratory Rate (breaths/min): 20 Body Mass Index (BMI): 37.6 Blood Pressure (mmHg): 103/57 Reference Range: 80 - 120 mg / dl Electronic Signature(s) Signed:  06/03/2017 4:53:17 PM By: Alejandro Mulling Entered By: Alejandro Mulling on 06/03/2017 10:35:45

## 2017-06-06 ENCOUNTER — Encounter: Payer: Self-pay | Admitting: Cardiovascular Disease

## 2017-06-06 ENCOUNTER — Ambulatory Visit (INDEPENDENT_AMBULATORY_CARE_PROVIDER_SITE_OTHER): Payer: Medicare Other | Admitting: Cardiovascular Disease

## 2017-06-06 VITALS — BP 114/68 | HR 81 | Ht 63.0 in | Wt 213.0 lb

## 2017-06-06 DIAGNOSIS — I872 Venous insufficiency (chronic) (peripheral): Secondary | ICD-10-CM | POA: Diagnosis not present

## 2017-06-06 DIAGNOSIS — I482 Chronic atrial fibrillation, unspecified: Secondary | ICD-10-CM

## 2017-06-06 NOTE — Patient Instructions (Signed)
Medication Instructions:  Your physician recommends that you continue on your current medications as directed. Please refer to the Current Medication list given to you today.   Labwork: none  Testing/Procedures: Your physician has ordered lymphedema pump. We will contact you with the information from [employer].  Follow-Up: Your physician recommends that you schedule a follow-up appointment as needed with Dr. Kirke CorinARida.    Any Other Special Instructions Will Be Listed Below (If Applicable).     If you need a refill on your cardiac medications before your next appointment, please call your pharmacy.

## 2017-06-06 NOTE — Progress Notes (Signed)
Cardiology Office Note   Date:  06/06/2017   ID:  [name] BudMargaret Weisheit, DOB 23-Jan-1926, MRN [id number]  PCP:  Barbette ReichmannHande, Vishwanath, MD  Cardiologist: Dr. Gwen PoundsKowalski  Chief Complaint  Patient presents with  . other    Wound Care c/o sob and rapid heart beat with walking. Meds reviewed verbally with pt.      History of Present Illness: [name] [name] is a 83 y.o. female who was referred by Dr. Meyer RusselBritto for evaluation of left lower extremity wound and possible underlying peripheral arterial disease. She has chronic medical conditions that include type 2 diabetes, hypertension, atrial fibrillation, CKD and coronary artery disease status post CABG.  She is followed by Dr. Gwen PoundsKowalski for her cardiac conditions.  She was recently diagnosed with iron deficiency anemia with gradual decline in hemoglobin to 9.5.  She declined endoscopic procedures. She reports chronic bilateral leg edema with recent worsening.  She developed bilateral wounds above the ankle which are superficial overall.  Both improved but she developed a new ulceration on the right side after a small blister.  She has no leg pain and no claudication.  Lower extremity arterial Doppler showed noncompressible vessels bilaterally with abnormal toe pressure at 0.55 on the right and 0.47 on the left with triphasic waveforms.  Past Medical History:  Diagnosis Date  . A-fib (HCC)   . A-fib (HCC)   . Chronic kidney disease (CKD), stage III (moderate) (HCC)   . Diabetes mellitus without complication (HCC)   . Hypertension   . Thyroid disease     Past Surgical History:  Procedure Laterality Date  . CARDIAC CATHETERIZATION    . CORONARY ARTERY BYPASS GRAFT       Current Outpatient Medications  Medication Sig Dispense Refill  . amoxicillin (AMOXIL) 500 MG tablet Take 500 mg by mouth 3 (three) times daily.     . Cyanocobalamin 2500 MCG CHEW Take 1 tablet by mouth daily.    Marland Kitchen. gabapentin (NEURONTIN) 100 MG capsule Take 100 mg by mouth at  bedtime.     . Insulin Glargine (LANTUS SOLOSTAR) 100 UNIT/ML Solostar Pen Inject 50 Units into the skin at bedtime.     . insulin lispro (HUMALOG KWIKPEN) 100 UNIT/ML KiwkPen IJECT 28 UNITS SUBCUTANEOUSLY 3 TIMES A DAY WITH MEALS    . iron polysaccharides (NU-IRON) 150 MG capsule Take 150 mg by mouth daily.     . isosorbide mononitrate (IMDUR) 30 MG 24 hr tablet Take 30 mg by mouth daily.     Marland Kitchen. LANTUS SOLOSTAR 100 UNIT/ML Solostar Pen Inject 36 Units into the skin at bedtime. (Patient taking differently: Inject 50 Units into the skin at bedtime. ) 15 mL 0  . levothyroxine (SYNTHROID, LEVOTHROID) 25 MCG tablet Take 25 mcg by mouth daily before breakfast.     . losartan (COZAAR) 25 MG tablet Take 1 tablet (25 mg total) by mouth daily. 30 tablet 0  . metoprolol (LOPRESSOR) 25 MG tablet Take 1 tablet (25 mg total) by mouth 2 (two) times daily. (Patient taking differently: Take 50 mg by mouth 2 (two) times daily. ) 60 tablet 0  . Multiple Vitamin (MULTIVITAMIN) capsule Take 1 capsule by mouth daily.    . Multiple Vitamins-Minerals (OCUVITE EYE + MULTI) TABS Take 1 tablet by mouth daily.    . mupirocin ointment (BACTROBAN) 2 % Apply topically.    . potassium chloride SA (K-DUR,KLOR-CON) 20 MEQ tablet Take 10 tablets by mouth every other day.     . torsemide (DEMADEX) 20  MG tablet Take 20 mg by mouth daily.     Marland Kitchen. warfarin (COUMADIN) 2.5 MG tablet Take 2.5 mg by mouth daily.  0   No current facility-administered medications for this visit.     Allergies:   Pineapple    Social History:  The patient  reports that  has never smoked. she has never used smokeless tobacco. She reports that she does not drink alcohol or use drugs.   Family History:  The patient's family history includes Heart Problems in her mother.    ROS:  Please see the history of present illness.   Otherwise, review of systems are positive for none.   All other systems are reviewed and negative.    PHYSICAL EXAM: VS:  BP 114/68  (BP Location: Right Wrist, Patient Position: Sitting, Cuff Size: Normal)   Pulse 81   Ht 5\' 3"  (1.6 m)   Wt 213 lb (96.6 kg)   BMI 37.73 kg/m  , BMI Body mass index is 37.73 kg/m. GEN: Well nourished, well developed, in no acute distress  HEENT: normal  Neck: no JVD, carotid bruits, or masses Cardiac: Irregularly irregular; no  rubs, or gallops.  2 out of 6 systolic murmur at the base.  Moderate bilateral leg edema above the ankle with chronic stasis and superficial wounds noted bilaterally. Respiratory:  clear to auscultation bilaterally, normal work of breathing GI: soft, nontender, nondistended, + BS MS: no deformity or atrophy  Skin: warm and dry, no rash Neuro:  Strength and sensation are intact Psych: euthymic mood, full affect   EKG:  EKG is ordered today. The ekg ordered today demonstrates atrial fibrillation with nonspecific ST changes.   Recent Labs: No results found for requested labs within last 8760 hours.    Lipid Panel No results found for: CHOL, TRIG, HDL, CHOLHDL, VLDL, LDLCALC, LDLDIRECT    Wt Readings from Last 3 Encounters:  06/06/17 213 lb (96.6 kg)  05/04/16 187 lb 14.4 oz (85.2 kg)  04/23/16 180 lb (81.6 kg)      PAD Screen 06/06/2017  Previous PAD dx? No  Previous surgical procedure? No  Pain with walking? No  Feet/toe relief with dangling? No  Painful, non-healing ulcers? Yes  Extremities discolored? No      ASSESSMENT AND PLAN:  1.  Bilateral leg wounds likely due to chronic venous insufficiency and possible component of lymphedema: The current distribution of the wounds is suggestive of this and not consistent with peripheral arterial disease. Obviously, concomitant peripheral arterial disease might be affecting the healing.  However, I do not think this is the primary issue.  Regardless, the patient is not interested in any invasive workup. At the present time, I recommend continued wound care, support stockings and leg elevation. I   think this patient benefits from a lymphedema pump to prevent recurrent wounds.  I prescribed this to her today.  I also think the patient might benefit from increasing diuretic dose and this should be discussed with her primary care physician and cardiologist Dr. Gwen PoundsKowalski.  2.  Chronic atrial fibrillation: Ventricular rate is controlled and she is on long-term anticoagulation.     Disposition:   FU with me as needed.  Signed,  Lorine BearsMuhammad Viviano Bir, MD  06/06/2017 3:52 PM    [facility]

## 2017-06-10 ENCOUNTER — Encounter: Payer: Medicare Other | Admitting: Physician Assistant

## 2017-06-10 DIAGNOSIS — E11622 Type 2 diabetes mellitus with other skin ulcer: Secondary | ICD-10-CM | POA: Diagnosis not present

## 2017-06-12 NOTE — Progress Notes (Signed)
[name], [name] ([id number]) Visit Report for 06/10/2017 Arrival Information Details Patient Name: [name] [name] Date of Service: 06/10/2017 10:15 AM Medical Record Number: [id number] Patient Account Number: [id number] Date of Birth/Sex: 1926-01-02 (83 y.o. Female) Treating RN: [name] [name] Primary Care Sante Biedermann: Barbette Reichmann Other Clinician: Referring Adjoa Althouse: Barbette Reichmann Treating Marlo Arriola/Extender: Linwood Dibbles, HOYT Weeks in Treatment: 3 Visit Information History Since Last Visit Added or deleted any medications: No Patient Arrived: Cane Any new allergies or adverse reactions: No Arrival Time: 10:22 Had a fall or experienced change in No Accompanied By: dtr activities of daily living that may affect Transfer Assistance: None risk of falls: Patient Identification Verified: Yes Signs or symptoms of abuse/neglect since last visito No Secondary Verification Process Yes Hospitalized since last visit: No Completed: Has Dressing in Place as Prescribed: Yes Patient Requires Transmission-Based No Has Compression in Place as Prescribed: Yes Precautions: Pain Present Now: No Patient Has Alerts: Yes Patient Alerts: Patient on Blood Thinner DMII warfarin Electronic Signature(s) Signed: 06/10/2017 4:38:31 PM By: [name] [name] Entered By: [name] [name] on 06/10/2017 10:26:49 [name] [name] ([id number]) -------------------------------------------------------------------------------- Clinic Level of Care Assessment Details Patient Name: [name] [name] Date of Service: 06/10/2017 10:15 AM Medical Record Number: [id number] Patient Account Number: [id number] Date of Birth/Sex: April 30, 1926 (83 y.o. Female) Treating RN: [name] [name] Primary Care Meyah Corle: Barbette Reichmann Other Clinician: Referring Amberlea Spagnuolo: Barbette Reichmann Treating Tamarius Rosenfield/Extender: Linwood Dibbles, HOYT Weeks in Treatment: 3 Clinic Level of Care Assessment Items TOOL 4 Quantity Score []  - Use when  only an EandM is performed on FOLLOW-UP visit 0 ASSESSMENTS - Nursing Assessment / Reassessment X - Reassessment of Co-morbidities (includes updates in patient status) 1 10 X- 1 5 Reassessment of Adherence to Treatment Plan ASSESSMENTS - Wound and Skin Assessment / Reassessment X - Simple Wound Assessment / Reassessment - one wound 1 5 []  - 0 Complex Wound Assessment / Reassessment - multiple wounds X- 1 10 Dermatologic / Skin Assessment (not related to wound area) ASSESSMENTS - Focused Assessment []  - Circumferential Edema Measurements - multi extremities 0 []  - 0 Nutritional Assessment / Counseling / Intervention X- 1 5 Lower Extremity Assessment (monofilament, tuning fork, pulses) []  - 0 Peripheral Arterial Disease Assessment (using hand held doppler) ASSESSMENTS - Ostomy and/or Continence Assessment and Care []  - Incontinence Assessment and Management 0 []  - 0 Ostomy Care Assessment and Management (repouching, etc.) PROCESS - Coordination of Care X - Simple Patient / Family Education for ongoing care 1 15 []  - 0 Complex (extensive) Patient / Family Education for ongoing care []  - 0 Staff obtains Chiropractor, Records, Test Results / Process Orders []  - 0 Staff telephones HHA, Nursing Homes / Clarify orders / etc []  - 0 Routine Transfer to another Facility (non-emergent condition) []  - 0 Routine Hospital Admission (non-emergent condition) []  - 0 New Admissions / Manufacturing engineer / Ordering NPWT, Apligraf, etc. []  - 0 Emergency Hospital Admission (emergent condition) X- 1 10 Simple Discharge Coordination [name] [name] ([id number]) []  - 0 Complex (extensive) Discharge Coordination PROCESS - Special Needs []  - Pediatric / Minor Patient Management 0 []  - 0 Isolation Patient Management []  - 0 Hearing / Language / Visual special needs []  - 0 Assessment of Community assistance (transportation, D/C planning, etc.) []  - 0 Additional assistance / Altered  mentation []  - 0 Support Surface(s) Assessment (bed, cushion, seat, etc.) INTERVENTIONS - Wound Cleansing / Measurement X - Simple Wound Cleansing - one wound 1 5 []  - 0 Complex Wound Cleansing - multiple wounds X- 1 5 Wound Imaging (  photographs - any number of wounds) []  - 0 Wound Tracing (instead of photographs) X- 1 5 Simple Wound Measurement - one wound []  - 0 Complex Wound Measurement - multiple wounds INTERVENTIONS - Wound Dressings []  - Small Wound Dressing one or multiple wounds 0 []  - 0 Medium Wound Dressing one or multiple wounds X- 1 20 Large Wound Dressing one or multiple wounds []  - 0 Application of Medications - topical []  - 0 Application of Medications - injection INTERVENTIONS - Miscellaneous []  - External ear exam 0 []  - 0 Specimen Collection (cultures, biopsies, blood, body fluids, etc.) []  - 0 Specimen(s) / Culture(s) sent or taken to Lab for analysis []  - 0 Patient Transfer (multiple staff / Nurse, adultHoyer Lift / Similar devices) []  - 0 Simple Staple / Suture removal (25 or less) []  - 0 Complex Staple / Suture removal (26 or more) []  - 0 Hypo / Hyperglycemic Management (close monitor of Blood Glucose) []  - 0 Ankle / Brachial Index (ABI) - do not check if billed separately X- 1 5 Vital Signs [name] [name] ([id number]) Has the patient been seen at the hospital within the last three years: Yes Total Score: 100 Level Of Care: New/Established - Level 3 Electronic Signature(s) Signed: 06/10/2017 4:38:31 PM By: [name] Sitesorthy, [name] [name] Entered By: [name] Sitesorthy, [name] [name] on 06/10/2017 11:17:04 [name] [name], [name] [name] ([id number]) -------------------------------------------------------------------------------- Encounter Discharge Information Details Patient Name: [name] [name], [name] [name] Date of Service: 06/10/2017 10:15 AM Medical Record Number: [id number] Patient Account Number: [id number] Date of Birth/Sex: [birth year] (83 y.o. Female) Treating RN: [name] Sitesorthy, [name] [name] Primary Care Kahlin Mark:  Barbette ReichmannHande, Vishwanath Other Clinician: Referring Haylie Mccutcheon: Barbette ReichmannHande, Vishwanath Treating Kaleea Penner/Extender: Linwood DibblesSTONE III, HOYT Weeks in Treatment: 3 Encounter Discharge Information Items Discharge Pain Level: 0 Discharge Condition: Stable Ambulatory Status: Cane Discharge Destination: Home Transportation: Private Auto Accompanied By: dtr Schedule Follow-up Appointment: Yes Medication Reconciliation completed and No provided to Patient/Care Chanele Douglas: Provided on Clinical Summary of Care: 06/10/2017 Form Type Recipient Paper Patient MS Electronic Signature(s) Signed: 06/10/2017 11:18:06 AM By: [name] Sitesorthy, [name] [name] Entered By: [name] Sitesorthy, [name] [name] on 06/10/2017 11:18:06 [name] [name], Ammy ([id number]) -------------------------------------------------------------------------------- Lower Extremity Assessment Details Patient Name: [name] [name], [name] [name] Date of Service: 06/10/2017 10:15 AM Medical Record Number: [id number] Patient Account Number: [id number] Date of Birth/Sex: [birth year] (83 y.o. Female) Treating RN: [name] Sitesorthy, [name] [name] Primary Care Jeancarlos Marchena: Barbette ReichmannHande, Vishwanath Other Clinician: Referring Donyale Falcon: Barbette ReichmannHande, Vishwanath Treating Takirah Binford/Extender: Linwood DibblesSTONE III, HOYT Weeks in Treatment: 3 Edema Assessment Assessed: [Left: No] [Right: No] [Left: Edema] [Right: :] Calf Left: Right: Point of Measurement: 32 cm From Medial Instep 43 cm cm Ankle Left: Right: Point of Measurement: 9 cm From Medial Instep 27.6 cm cm Vascular Assessment Pulses: Dorsalis Pedis Palpable: [Left:Yes] Posterior Tibial Extremity colors, hair growth, and conditions: Extremity Color: [Left:Hyperpigmented] Hair Growth on Extremity: [Left:No] Temperature of Extremity: [Left:Warm] Capillary Refill: [Left:< 3 seconds] Toe Nail Assessment Left: Right: Thick: Yes Discolored: Yes Deformed: Yes Improper Length and Hygiene: Yes Electronic Signature(s) Signed: 06/10/2017 4:38:31 PM By: [name] Sitesorthy, [name] [name] Entered By: [name] Sitesorthy, [name] [name] on  06/10/2017 10:40:14 [name] [name], [name] [name] ([id number]) -------------------------------------------------------------------------------- Multi Wound Chart Details Patient Name: [name] [name], [name] [name] Date of Service: 06/10/2017 10:15 AM Medical Record Number: [id number] Patient Account Number: [id number] Date of Birth/Sex: [birth year] (83 y.o. Female) Treating RN: [name] Sitesorthy, [name] [name] Primary Care Darl Kuss: Barbette ReichmannHande, Vishwanath Other Clinician: Referring Barbi Kumagai: Barbette ReichmannHande, Vishwanath Treating Darryl Willner/Extender: Linwood DibblesSTONE III, HOYT Weeks in Treatment: 3 Vital Signs Height(in): 63 Pulse(bpm): 67 Weight(lbs): 212 Blood Pressure(mmHg): 114/57 Body Mass Index(BMI): 38 Temperature(F): 98.1 Respiratory Rate 20 (breaths/min): Photos: [1:No Photos] [N/A:N/A] Wound Location: [1:Left Lower Leg - Posterior] [N/A:N/A] Wounding  Event: [1:Blister] [N/A:N/A] Primary Etiology: [1:Diabetic Wound/Ulcer of the Lower Extremity] [N/A:N/A] Secondary Etiology: [1:Lymphedema] [N/A:N/A] Comorbid History: [1:Cataracts, Anemia, Arrhythmia, Congestive Heart Failure, Hypertension, Type II Diabetes] [N/A:N/A] Date Acquired: [1:12/28/2016] [N/A:N/A] Weeks of Treatment: [1:3] [N/A:N/A] Wound Status: [1:Open] [N/A:N/A] Measurements L x W x D [1:0.5x0.5x0.1] [N/A:N/A] (cm) Area (cm) : [1:0.196] [N/A:N/A] Volume (cm) : [1:0.02] [N/A:N/A] % Reduction in Area: [1:16.90%] [N/A:N/A] % Reduction in Volume: [1:16.70%] [N/A:N/A] Classification: [1:Grade 1] [N/A:N/A] Exudate Amount: [1:Large] [N/A:N/A] Exudate Type: [1:Serous] [N/A:N/A] Exudate Color: [1:amber] [N/A:N/A] Wound Margin: [1:Flat and Intact] [N/A:N/A] Granulation Amount: [1:None Present (0%)] [N/A:N/A] Necrotic Amount: [1:Large (67-100%)] [N/A:N/A] Exposed Structures: [1:Fascia: No Fat Layer (Subcutaneous Tissue) Exposed: No Tendon: No Muscle: No Joint: No Bone: No] [N/A:N/A] Epithelialization: [1:None] [N/A:N/A] Periwound Skin Texture: [1:Excoriation: No Induration: No  Callus: No] [N/A:N/A] Crepitus: No Rash: No Scarring: No Periwound Skin Moisture: Maceration: No N/A N/A Dry/Scaly: No Periwound Skin Color: Atrophie Blanche: No N/A N/A Cyanosis: No Ecchymosis: No Erythema: No Hemosiderin Staining: No Mottled: No Pallor: No Rubor: No Temperature: No Abnormality N/A N/A Tenderness on Palpation: Yes N/A N/A Wound Preparation: Ulcer Cleansing: N/A N/A Rinsed/Irrigated with Saline, Other: soap and water Topical Anesthetic Applied: Other: lidocaine 4% Treatment Notes Electronic Signature(s) Signed: 06/10/2017 4:38:31 PM By: [name] [name] Entered By: [name] [name] on 06/10/2017 10:37:58 SAYAKA, HOEPPNER ([id number]) -------------------------------------------------------------------------------- Multi-Disciplinary Care Plan Details Patient Name: [name] [name] Date of Service: 06/10/2017 10:15 AM Medical Record Number: [id number] Patient Account Number: [id number] Date of Birth/Sex: March 05, 1926 (83 y.o. Female) Treating RN: [name] [name] Primary Care Aleksia Freiman: Barbette Reichmann Other Clinician: Referring Vuk Skillern: Barbette Reichmann Treating Jayne Peckenpaugh/Extender: Linwood Dibbles, HOYT Weeks in Treatment: 3 Active Inactive ` Abuse / Safety / Falls / Self Care Management Nursing Diagnoses: Impaired physical mobility Goals: Patient will not experience any injury related to falls Date Initiated: 05/19/2017 Target Resolution Date: 07/30/2017 Goal Status: Active Interventions: Assess fall risk on admission and as needed Notes: ` Orientation to the Wound Care Program Nursing Diagnoses: Knowledge deficit related to the wound healing center program Goals: Patient/caregiver will verbalize understanding of the Wound Healing Center Program Date Initiated: 05/19/2017 Target Resolution Date: 07/30/2017 Goal Status: Active Interventions: Provide education on orientation to the wound center Notes: ` Wound/Skin Impairment Nursing Diagnoses: Impaired  tissue integrity Goals: Ulcer/skin breakdown will heal within 14 weeks Date Initiated: 05/19/2017 Target Resolution Date: 07/30/2017 Goal Status: Active Interventions: CHANIKA, BYLAND ([id number]) Assess patient/caregiver ability to obtain necessary supplies Assess patient/caregiver ability to perform ulcer/skin care regimen upon admission and as needed Assess ulceration(s) every visit Notes: Electronic Signature(s) Signed: 06/10/2017 4:38:31 PM By: [name] [name] Entered By: [name] [name] on 06/10/2017 10:37:51 [name] [name] ([id number]) -------------------------------------------------------------------------------- Pain Assessment Details Patient Name: [name] [name] Date of Service: 06/10/2017 10:15 AM Medical Record Number: [id number] Patient Account Number: [id number] Date of Birth/Sex: 1925/12/11 (83 y.o. Female) Treating RN: [name] [name] Primary Care Khayman Kirsch: Barbette Reichmann Other Clinician: Referring Kyira Volkert: Barbette Reichmann Treating Travonne Schowalter/Extender: Linwood Dibbles, HOYT Weeks in Treatment: 3 Active Problems Location of Pain Severity and Description of Pain Patient Has Paino No Site Locations Pain Management and Medication Current Pain Management: Electronic Signature(s) Signed: 06/10/2017 4:38:31 PM By: [name] [name] Entered By: [name] [name] on 06/10/2017 10:26:58 [name] [name] ([id number]) -------------------------------------------------------------------------------- Patient/Caregiver Education Details Patient Name: [name] [name] Date of Service: 06/10/2017 10:15 AM Medical Record Number: [id number] Patient Account Number: [id number] Date of Birth/Gender: December 17, 1925 (83 y.o. Female) Treating RN: [name] [name] Primary Care Physician: Barbette Reichmann Other Clinician: Referring Physician: Barbette Reichmann Treating Physician/Extender: Linwood Dibbles, HOYT Weeks in Treatment:  3 Education Assessment Education Provided To: Patient Education Topics  Provided Wound/Skin Impairment: Handouts: Other: wound care as ordered Methods: Demonstration, Explain/Verbal Responses: State content correctly Electronic Signature(s) Signed: 06/10/2017 4:38:31 PM By: [name] Sitesorthy, [name] [name] Entered By: [name] Sitesorthy, [name] [name] on 06/10/2017 11:18:25 [name] [name], [name] [name] ([id number]) -------------------------------------------------------------------------------- Wound Assessment Details Patient Name: [name] [name], [name] [name] Date of Service: 06/10/2017 10:15 AM Medical Record Number: [id number] Patient Account Number: [id number] Date of Birth/Sex: Aug 11, 1925 (83 y.o. Female) Treating RN: [name] Sitesorthy, [name] [name] Primary Care Dequavious Harshberger: Barbette ReichmannHande, Vishwanath Other Clinician: Referring Cornelious Diven: Barbette ReichmannHande, Vishwanath Treating Maiya Kates/Extender: Linwood DibblesSTONE III, HOYT Weeks in Treatment: 3 Wound Status Wound Number: 1 Primary Diabetic Wound/Ulcer of the Lower Extremity Etiology: Wound Location: Left Lower Leg - Posterior Secondary Lymphedema Wounding Event: Blister Etiology: Date Acquired: 12/28/2016 Wound Status: Open Weeks Of Treatment: 3 Comorbid Cataracts, Anemia, Arrhythmia, Congestive Clustered Wound: No History: Heart Failure, Hypertension, Type II Diabetes Photos Photo Uploaded By: [name] Sitesorthy, [name] [name] on 06/10/2017 14:30:55 Wound Measurements Length: (cm) 0.5 Width: (cm) 0.5 Depth: (cm) 0.1 Area: (cm) 0.196 Volume: (cm) 0.02 % Reduction in Area: 16.9% % Reduction in Volume: 16.7% Epithelialization: None Tunneling: No Undermining: No Wound Description Classification: Grade 1 Wound Margin: Flat and Intact Exudate Amount: Large Exudate Type: Serous Exudate Color: amber Foul Odor After Cleansing: No Slough/Fibrino Yes Wound Bed Granulation Amount: None Present (0%) Exposed Structure Necrotic Amount: Large (67-100%) Fascia Exposed: No Necrotic Quality: Adherent Slough Fat Layer (Subcutaneous Tissue) Exposed: No Tendon Exposed: No Muscle Exposed: No Joint Exposed: No Bone  Exposed: No Periwound Skin Texture Stumpe, Armenta ([id number]) Texture Color No Abnormalities Noted: No No Abnormalities Noted: No Callus: No Atrophie Blanche: No Crepitus: No Cyanosis: No Excoriation: No Ecchymosis: No Induration: No Erythema: No Rash: No Hemosiderin Staining: No Scarring: No Mottled: No Pallor: No Moisture Rubor: No No Abnormalities Noted: No Dry / Scaly: No Temperature / Pain Maceration: No Temperature: No Abnormality Tenderness on Palpation: Yes Wound Preparation Ulcer Cleansing: Rinsed/Irrigated with Saline, Other: soap and water, Topical Anesthetic Applied: Other: lidocaine 4%, Treatment Notes Wound #1 (Left, Posterior Lower Leg) 1. Cleansed with: Cleanse wound with antibacterial soap and water 2. Anesthetic Topical Lidocaine 4% cream to wound bed prior to debridement 4. Dressing Applied: Prisma Ag 5. Secondary Dressing Applied ABD Pad 7. Secured with Other (specify in notes) Notes kerlix and coban wrap, unna to Ecologistanchor Electronic Signature(s) Signed: 06/10/2017 4:38:31 PM By: [name] Sitesorthy, [name] [name] Entered By: [name] Sitesorthy, [name] [name] on 06/10/2017 10:31:38 [name] [name], Schelly ([id number]) -------------------------------------------------------------------------------- Vitals Details Patient Name: [name] [name], [name] [name] Date of Service: 06/10/2017 10:15 AM Medical Record Number: [id number] Patient Account Number: [id number] Date of Birth/Sex: Aug 11, 1925 (83 y.o. Female) Treating RN: [name] Sitesorthy, [name] [name] Primary Care Tsuneo Faison: Barbette ReichmannHande, Vishwanath Other Clinician: Referring Kaleah Hagemeister: Barbette ReichmannHande, Vishwanath Treating Aziya Arena/Extender: Linwood DibblesSTONE III, HOYT Weeks in Treatment: 3 Vital Signs Time Taken: 10:27 Temperature (F): 98.1 Height (in): 63 Pulse (bpm): 67 Weight (lbs): 212 Respiratory Rate (breaths/min): 20 Body Mass Index (BMI): 37.6 Blood Pressure (mmHg): 114/57 Reference Range: 80 - 120 mg / dl Electronic Signature(s) Signed: 06/10/2017 4:38:31 PM By: [name] Sitesorthy,  [name] [name] Entered By: [name] Sitesorthy, [name] [name] on 06/10/2017 10:27:17

## 2017-06-16 NOTE — Progress Notes (Signed)
ROBBI, SPELLS ([id number]) Visit Report for 06/10/2017 Chief Complaint Document Details Patient Name: [name] [name], [name] [name] Date of Service: 06/10/2017 10:15 AM Medical Record Number: [id number] Patient Account Number: [id number] Date of Birth/Sex: [birth year] (83 y.o. Female) Treating RN: Curtis Sites Primary Care Provider: Barbette Reichmann Other Clinician: Referring Provider: Barbette Reichmann Treating Provider/Extender: Linwood Dibbles, HOYT Weeks in Treatment: 3 Information Obtained from: Patient Chief Complaint Patient presents to the wound care center for a consult due non healing wound to the left lower extremity with bilateral massive swelling of the legs Electronic Signature(s) Signed: 06/12/2017 1:59:17 AM By: Lenda Kelp PA-C Entered By: Lenda Kelp on 06/10/2017 10:36:21 [name] [name] ([id number]) -------------------------------------------------------------------------------- HPI Details Patient Name: [name] [name] Date of Service: 06/10/2017 10:15 AM Medical Record Number: [id number] Patient Account Number: [id number] Date of Birth/Sex: Jan 31, 1926 (83 y.o. Female) Treating RN: Curtis Sites Primary Care Provider: Barbette Reichmann Other Clinician: Referring Provider: Barbette Reichmann Treating Provider/Extender: Linwood Dibbles, HOYT Weeks in Treatment: 3 History of Present Illness Location: bilateral swelling of the legs associated with ulceration on the left lower extremity Quality: Patient reports experiencing a dull pain to affected area(s). Severity: Patient states wound are getting worse. Duration: Patient has had the wound for > 3 months prior to seeking treatment at the wound center Timing: Pain in wound is Intermittent (comes and goes Context: The wound would happen gradually Modifying Factors: Other treatment(s) tried include:local care as per the PCP Associated Signs and Symptoms: Patient reports having increase swelling. HPI Description: 83 year old  patient seen by her PCP and referred to our center for left lower extremity stasis ulceration. She is known to have diabetes mellitus type 2, hypertension,status post cholecystectomy and open heart surgery, and atrial fibrillation and has bilateral lower extremity lymphedema with oozing of fluid. Her most recent hemoglobin A1c was 7.2. after assessment by the PCP she was put on torsemide 20 mg daily, echo was noted to have an ejection fraction of more than 55% with moderate MR, and the patient was maintained on warfarin for atrial fibrillation. the patient has been living in [place] for about a year but has been traveling a bit and has not had any arterial or venous studies done recently. Her last echo was about a year and a half ago. 05/27/2017 -- she has not had any dressing changes since the last time she was here as a home health did not turn up. Her arterial duplex study is scheduled for next week. 06/03/2017 -- -- had a lower extremity arterial study done and there was arterial wall calcification and the resting ABI was noncompressible bilaterally. The right and left toe brachial indices were abnormal with the left first toe pressure being 76 mmHg and the right toe pressure was 89 mmHg. The right toe brachial index was 0.55 and the left toe brachial index was 0.47. 06/10/17 on evaluation today patient has a small wound on the posterior aspect of her left lower extremity. This is extremely tender to palpation and cleansing. With that being said she does seem to be doing somewhat better in regard to this wound in general in my opinion. She has been tolerating the dressing changes fairly well. No fevers, chills, nausea, or vomiting noted at this time. Electronic Signature(s) Signed: 06/12/2017 1:59:17 AM By: Lenda Kelp PA-C Entered By: Lenda Kelp on 06/10/2017 17:35:47 PIERRE, CUMPTON  ([id number]) -------------------------------------------------------------------------------- Physical Exam Details Patient Name: [name] [name] Date of Service: 06/10/2017 10:15 AM Medical Record Number: [id number] Patient Account Number: [id number] Date of Birth/Sex:  01-16-1926 (83 y.o. Female) Treating RN: Curtis Sitesorthy, Joanna Primary Care Provider: Barbette ReichmannHande, Vishwanath Other Clinician: Referring Provider: Barbette ReichmannHande, Vishwanath Treating Provider/Extender: STONE III, HOYT Weeks in Treatment: 3 Constitutional Obese and well-hydrated in no acute distress. Respiratory normal breathing without difficulty. clear to auscultation bilaterally. Cardiovascular regular rate and rhythm with normal S1, S2. Psychiatric this patient is able to make decisions and demonstrates good insight into disease process. Alert and Oriented x 3. pleasant and cooperative. Notes Patient's wound bed did have some Slough covering although I was able to cleanse this off with saline and a gauze today. She would not tolerate any other debridement. This was barely tolerated by her. Electronic Signature(s) Signed: 06/12/2017 1:59:17 AM By: Lenda KelpStone III, Hoyt PA-C Entered By: Lenda KelpStone III, Hoyt on 06/10/2017 17:37:18 [name] [name], [name] [name] ([id number]) -------------------------------------------------------------------------------- Physician Orders Details Patient Name: [name] [name], [name] [name] Date of Service: 06/10/2017 10:15 AM Medical Record Number: [id number] Patient Account Number: [id number] Date of Birth/Sex: 01-16-1926 (83 y.o. Female) Treating RN: Curtis Sitesorthy, Joanna Primary Care Provider: Barbette ReichmannHande, Vishwanath Other Clinician: Referring Provider: Barbette ReichmannHande, Vishwanath Treating Provider/Extender: Linwood DibblesSTONE III, HOYT Weeks in Treatment: 3 Verbal / Phone Orders: No Diagnosis Coding ICD-10 Coding Code Description E11.622 Type 2 diabetes mellitus with other skin ulcer I89.0 Lymphedema, not elsewhere classified L97.222 Non-pressure chronic ulcer of left  calf with fat layer exposed I50.22 Chronic systolic (congestive) heart failure N18.3 Chronic kidney disease, stage 3 (moderate) Wound Cleansing Wound #1 Left,Posterior Lower Leg o Clean wound with Normal Saline. o May Shower, gently pat wound dry prior to applying new dressing. Anesthetic (add to Medication List) Wound #1 Left,Posterior Lower Leg o Topical Lidocaine 4% cream applied to wound bed prior to debridement (In Clinic Only). Primary Wound Dressing Wound #1 Left,Posterior Lower Leg o Prisma Ag Secondary Dressing Wound #1 Left,Posterior Lower Leg o ABD pad Dressing Change Frequency Wound #1 Left,Posterior Lower Leg o Change Dressing Monday, Wednesday, Friday Follow-up Appointments o Return Appointment in 1 week. Edema Control Wound #1 Left,Posterior Lower Leg o Kerlix and Coban - Bilateral - lightly from toes to 3cm below the knee - may anchor with unna wrap if needed Additional Orders / Instructions Wound #1 Left,Posterior Lower Leg o Increase protein intake. o Other: - Please try to keep blood sugars below 180. Please add over the counter vitamin C, multivitamin and zinc supplements to your diet. [name] [name], [name] [name] ([id number]) Home Health Wound #1 Left,Posterior Lower Leg o Continue Home Health Visits o Home Health Nurse may visit PRN to address patientos wound care needs. o FACE TO FACE ENCOUNTER: MEDICARE and MEDICAID PATIENTS: I certify that this patient is under my care and that I had a face-to-face encounter that meets the physician face-to-face encounter requirements with this patient on this date. The encounter with the patient was in whole or in part for the following MEDICAL CONDITION: (primary reason for Home Healthcare) MEDICAL NECESSITY: I certify, that based on my findings, NURSING services are a medically necessary home health service. HOME BOUND STATUS: I certify that my clinical findings support that this patient is homebound  (i.e., Due to illness or injury, pt requires aid of supportive devices such as crutches, cane, wheelchairs, walkers, the use of special transportation or the assistance of another person to leave their place of residence. There is a normal inability to leave the home and doing so requires considerable and taxing effort. Other absences are for medical reasons / religious services and are infrequent or of short duration when for other reasons). o If current dressing causes regression in wound  condition, may D/C ordered dressing product/s and apply Normal Saline Moist Dressing daily until next Wound Healing Center / Other MD appointment. Notify Wound Healing Center of regression in wound condition at [phone]. o Please direct any NON-WOUND related issues/requests for orders to patient's Primary Care Physician Electronic Signature(s) Signed: 06/10/2017 4:38:31 PM By: Curtis Sites Signed: 06/12/2017 1:59:17 AM By: Lenda Kelp PA-C Entered By: Curtis Sites on 06/10/2017 10:43:30 [name] [name] ([id number]) -------------------------------------------------------------------------------- Problem List Details Patient Name: [name] [name] Date of Service: 06/10/2017 10:15 AM Medical Record Number: [id number] Patient Account Number: [id number] Date of Birth/Sex: Mar 13, 1926 (83 y.o. Female) Treating RN: Curtis Sites Primary Care Provider: Barbette Reichmann Other Clinician: Referring Provider: Barbette Reichmann Treating Provider/Extender: Linwood Dibbles, HOYT Weeks in Treatment: 3 Active Problems ICD-10 Encounter Code Description Active Date Diagnosis E11.622 Type 2 diabetes mellitus with other skin ulcer 05/19/2017 Yes I89.0 Lymphedema, not elsewhere classified 05/19/2017 Yes L97.222 Non-pressure chronic ulcer of left calf with fat layer exposed 05/19/2017 Yes I50.22 Chronic systolic (congestive) heart failure 05/19/2017 Yes N18.3 Chronic kidney disease, stage 3 (moderate)  05/19/2017 Yes Inactive Problems Resolved Problems Electronic Signature(s) Signed: 06/12/2017 1:59:17 AM By: Lenda Kelp PA-C Entered By: Lenda Kelp on 06/10/2017 10:35:59 [name] [name] ([id number]) -------------------------------------------------------------------------------- Progress Note Details Patient Name: [name] [name] Date of Service: 06/10/2017 10:15 AM Medical Record Number: [id number] Patient Account Number: [id number] Date of Birth/Sex: 02-Oct-1925 (83 y.o. Female) Treating RN: Curtis Sites Primary Care Provider: Barbette Reichmann Other Clinician: Referring Provider: Barbette Reichmann Treating Provider/Extender: Linwood Dibbles, HOYT Weeks in Treatment: 3 Subjective Chief Complaint Information obtained from Patient Patient presents to the wound care center for a consult due non healing wound to the left lower extremity with bilateral massive swelling of the legs History of Present Illness (HPI) The following HPI elements were documented for the patient's wound: Location: bilateral swelling of the legs associated with ulceration on the left lower extremity Quality: Patient reports experiencing a dull pain to affected area(s). Severity: Patient states wound are getting worse. Duration: Patient has had the wound for > 3 months prior to seeking treatment at the wound center Timing: Pain in wound is Intermittent (comes and goes Context: The wound would happen gradually Modifying Factors: Other treatment(s) tried include:local care as per the PCP Associated Signs and Symptoms: Patient reports having increase swelling. 83 year old patient seen by her PCP and referred to our center for left lower extremity stasis ulceration. She is known to have diabetes mellitus type 2, hypertension,status post cholecystectomy and open heart surgery, and atrial fibrillation and has bilateral lower extremity lymphedema with oozing of fluid. Her most recent hemoglobin A1c was  7.2. after assessment by the PCP she was put on torsemide 20 mg daily, echo was noted to have an ejection fraction of more than 55% with moderate MR, and the patient was maintained on warfarin for atrial fibrillation. the patient has been living in [place] for about a year but has been traveling a bit and has not had any arterial or venous studies done recently. Her last echo was about a year and a half ago. 05/27/2017 -- she has not had any dressing changes since the last time she was here as a home health did not turn up. Her arterial duplex study is scheduled for next week. 06/03/2017 -- -- had a lower extremity arterial study done and there was arterial wall calcification and the resting ABI was noncompressible bilaterally. The right and left toe brachial indices were abnormal with the left first toe pressure  being 76 mmHg and the right toe pressure was 89 mmHg. The right toe brachial index was 0.55 and the left toe brachial index was 0.47. 06/10/17 on evaluation today patient has a small wound on the posterior aspect of her left lower extremity. This is extremely tender to palpation and cleansing. With that being said she does seem to be doing somewhat better in regard to this wound in general in my opinion. She has been tolerating the dressing changes fairly well. No fevers, chills, nausea, or vomiting noted at this time. Patient History Information obtained from Patient. Family History Cancer - Child, Heart Disease - Mother, No family history of Diabetes, Hereditary Spherocytosis, Hypertension, Kidney Disease, Lung Disease, Seizures, Stroke, Thyroid Problems, Tuberculosis. [name] [name], [name] [name] ([id number]) Social History Former smoker - quit 60 years ago, Marital Status - Widowed, Alcohol Use - Never, Drug Use - No History, Caffeine Use - Daily. Medical And Surgical History Notes Eyes macular degeneration Review of Systems (ROS) Constitutional Symptoms (General Health) Denies  complaints or symptoms of Fever, Chills. Respiratory The patient has no complaints or symptoms. Cardiovascular Complains or has symptoms of LE edema. Psychiatric The patient has no complaints or symptoms. Objective Constitutional Obese and well-hydrated in no acute distress. Vitals Time Taken: 10:27 AM, Height: 63 in, Weight: 212 lbs, BMI: 37.6, Temperature: 98.1 F, Pulse: 67 bpm, Respiratory Rate: 20 breaths/min, Blood Pressure: 114/57 mmHg. Respiratory normal breathing without difficulty. clear to auscultation bilaterally. Cardiovascular regular rate and rhythm with normal S1, S2. Psychiatric this patient is able to make decisions and demonstrates good insight into disease process. Alert and Oriented x 3. pleasant and cooperative. General Notes: Patient's wound bed did have some Slough covering although I was able to cleanse this off with saline and a gauze today. She would not tolerate any other debridement. This was barely tolerated by her. Integumentary (Hair, Skin) Wound #1 status is Open. Original cause of wound was Blister. The wound is located on the Left,Posterior Lower Leg. The wound measures 0.5cm length x 0.5cm width x 0.1cm depth; 0.196cm^2 area and 0.02cm^3 volume. There is no tunneling or undermining noted. There is a large amount of serous drainage noted. The wound margin is flat and intact. There is no granulation within the wound bed. There is a large (67-100%) amount of necrotic tissue within the wound bed including Adherent Slough. The periwound skin appearance did not exhibit: Callus, Crepitus, Excoriation, Induration, Rash, Scarring, Dry/Scaly, Maceration, Atrophie Blanche, Cyanosis, Ecchymosis, Hemosiderin Staining, Mottled, Pallor, Rubor, Erythema. Periwound temperature was noted as No Abnormality. The periwound has tenderness on palpation. [name] [name], [name] [name] ([id number]) Assessment Active Problems ICD-10 E11.622 - Type 2 diabetes mellitus with other skin  ulcer I89.0 - Lymphedema, not elsewhere classified L97.222 - Non-pressure chronic ulcer of left calf with fat layer exposed I50.22 - Chronic systolic (congestive) heart failure N18.3 - Chronic kidney disease, stage 3 (moderate) Plan Wound Cleansing: Wound #1 Left,Posterior Lower Leg: Clean wound with Normal Saline. May Shower, gently pat wound dry prior to applying new dressing. Anesthetic (add to Medication List): Wound #1 Left,Posterior Lower Leg: Topical Lidocaine 4% cream applied to wound bed prior to debridement (In Clinic Only). Primary Wound Dressing: Wound #1 Left,Posterior Lower Leg: Prisma Ag Secondary Dressing: Wound #1 Left,Posterior Lower Leg: ABD pad Dressing Change Frequency: Wound #1 Left,Posterior Lower Leg: Change Dressing Monday, Wednesday, Friday Follow-up Appointments: Return Appointment in 1 week. Edema Control: Wound #1 Left,Posterior Lower Leg: Kerlix and Coban - Bilateral - lightly from toes to 3cm below the knee -  may anchor with unna wrap if needed Additional Orders / Instructions: Wound #1 Left,Posterior Lower Leg: Increase protein intake. Other: - Please try to keep blood sugars below 180. Please add over the counter vitamin C, multivitamin and zinc supplements to your diet. Home Health: Wound #1 Left,Posterior Lower Leg: Continue Home Health Visits Home Health Nurse may visit PRN to address patient s wound care needs. FACE TO FACE ENCOUNTER: MEDICARE and MEDICAID PATIENTS: I certify that this patient is under my care and that I had a face-to-face encounter that meets the physician face-to-face encounter requirements with this patient on this date. The encounter with the patient was in whole or in part for the following MEDICAL CONDITION: (primary reason for Home Healthcare) MEDICAL NECESSITY: I certify, that based on my findings, NURSING services are a medically necessary home health service. HOME BOUND STATUS: I certify that my clinical findings  support that this patient is homebound (i.e., Due to illness or injury, pt requires aid of supportive devices such as crutches, cane, wheelchairs, walkers, the use of special transportation or the assistance of another person to leave their place of residence. There is a normal inability to leave the home and doing so requires considerable and taxing effort. Other absences are for medical reasons / religious services and [place], [place] ([id number]) are infrequent or of short duration when for other reasons). If current dressing causes regression in wound condition, may D/C ordered dressing product/s and apply Normal Saline Moist Dressing daily until next Wound Healing Center / Other MD appointment. Notify Wound Healing Center of regression in wound condition at [phone]. Please direct any NON-WOUND related issues/requests for orders to patient's Primary Care Physician I'm going to recommend at this point that we initiate treatment with silver collagen. We will see how this does to help promote healing and hopefully closure of this wound. Patient is in agreement with the plan. Please see above for specific wound care orders. We will see patient for re-evaluation in 1 week(s) here in the clinic. If anything worsens or changes patient will contact our office for additional recommendations. Electronic Signature(s) Signed: 06/12/2017 1:59:17 AM By: Lenda Kelp PA-C Entered By: Lenda Kelp on 06/10/2017 17:38:15 [name] [name] ([id number]) -------------------------------------------------------------------------------- ROS/PFSH Details Patient Name: [name] [name] Date of Service: 06/10/2017 10:15 AM Medical Record Number: [id number] Patient Account Number: [id number] Date of Birth/Sex: 1925-07-03 (83 y.o. Female) Treating RN: Curtis Sites Primary Care Provider: Barbette Reichmann Other Clinician: Referring Provider: Barbette Reichmann Treating Provider/Extender: Linwood Dibbles,  HOYT Weeks in Treatment: 3 Information Obtained From Patient Wound History Do you currently have one or more open woundso Yes How many open wounds do you currently haveo 2 Approximately how long have you had your woundso 5 months How have you been treating your wound(s) until nowo camphil Has your wound(s) ever healed and then re-openedo No Have you had any lab work done in the past montho No Have you tested positive for an antibiotic resistant organism (MRSA, VRE)o No Have you tested positive for osteomyelitis (bone infection)o No Have you had any tests for circulation on your legso No Have you had other problems associated with your woundso Swelling Constitutional Symptoms (General Health) Complaints and Symptoms: Negative for: Fever; Chills Cardiovascular Complaints and Symptoms: Positive for: LE edema Medical History: Positive for: Arrhythmia - a fib; Congestive Heart Failure; Hypertension Negative for: Angina; Coronary Artery Disease; Deep Vein Thrombosis; Hypotension; Myocardial Infarction; Peripheral Arterial Disease; Peripheral Venous Disease; Phlebitis; Vasculitis Eyes Medical History: Positive for: Cataracts - removed  Past Medical History Notes: macular degeneration Hematologic/Lymphatic Medical History: Positive for: Anemia Negative for: Hemophilia; Human Immunodeficiency Virus; Lymphedema; Sickle Cell Disease Respiratory Complaints and Symptoms: No Complaints or Symptoms Medical History: Negative for: Aspiration; Asthma; Chronic Obstructive Pulmonary Disease (COPD); Pneumothorax; Sleep Apnea; [name] [name], Jamira ([id number]) Tuberculosis Gastrointestinal Medical History: Negative for: Cirrhosis ; Colitis; Crohnos; Hepatitis A; Hepatitis B; Hepatitis C Endocrine Medical History: Positive for: Type II Diabetes Treated with: Insulin Immunological Medical History: Negative for: Lupus Erythematosus; Raynaudos; Scleroderma Musculoskeletal Medical  History: Negative for: Gout; Rheumatoid Arthritis; Osteoarthritis; Osteomyelitis Neurologic Medical History: Negative for: Dementia; Neuropathy Oncologic Medical History: Negative for: Received Chemotherapy; Received Radiation Psychiatric Complaints and Symptoms: No Complaints or Symptoms HBO Extended History Items Eyes: Cataracts Immunizations Pneumococcal Vaccine: Received Pneumococcal Vaccination: Yes Immunization Notes: up to date Implantable Devices Family and Social History Cancer: Yes - Child; Diabetes: No; Heart Disease: Yes - Mother; Hereditary Spherocytosis: No; Hypertension: No; Kidney Disease: No; Lung Disease: No; Seizures: No; Stroke: No; Thyroid Problems: No; Tuberculosis: No; Former smoker - quit 60 years ago; Marital Status - Widowed; Alcohol Use: Never; Drug Use: No History; Caffeine Use: Daily; Financial Concerns: No; Food, Clothing or Shelter Needs: No; Support System Lacking: No; Transportation Concerns: No; Advanced Directives: No; Patient does not want information on Advanced Directives Physician Ralene Okffirmation [name], [name] ([id number]) I have reviewed and agree with the above information. Electronic Signature(s) Signed: 06/12/2017 1:59:17 AM By: Lenda KelpStone III, Hoyt PA-C Signed: 06/15/2017 4:52:08 PM By: Curtis Sitesorthy, Joanna Entered By: Lenda KelpStone III, Hoyt on 06/10/2017 17:36:29 [name] [name], [name] [name] ([id number]) -------------------------------------------------------------------------------- SuperBill Details Patient Name: [name] [name], Jennavecia Date of Service: 06/10/2017 Medical Record Number: [id number] Patient Account Number: [id number] Date of Birth/Sex: 21-Dec-1925 (83 y.o. Female) Treating RN: Curtis Sitesorthy, Joanna Primary Care Provider: Barbette ReichmannHande, Vishwanath Other Clinician: Referring Provider: Barbette ReichmannHande, Vishwanath Treating Provider/Extender: Linwood DibblesSTONE III, HOYT Weeks in Treatment: 3 Diagnosis Coding ICD-10 Codes Code Description E11.622 Type 2 diabetes mellitus with other skin  ulcer I89.0 Lymphedema, not elsewhere classified L97.222 Non-pressure chronic ulcer of left calf with fat layer exposed I50.22 Chronic systolic (congestive) heart failure N18.3 Chronic kidney disease, stage 3 (moderate) Facility Procedures CPT4 Code: [id number] Description: 99213 - WOUND CARE VISIT-LEV 3 EST PT Modifier: Quantity: 1 Physician Procedures CPT4 Code: [id number] Description: 99213 - WC PHYS LEVEL 3 - EST PT ICD-10 Diagnosis Description E11.622 Type 2 diabetes mellitus with other skin ulcer I89.0 Lymphedema, not elsewhere classified L97.222 Non-pressure chronic ulcer of left calf with fat layer expo I50.22  Chronic systolic (congestive) heart failure Modifier: sed Quantity: 1 Electronic Signature(s) Signed: 06/12/2017 1:59:17 AM By: Lenda KelpStone III, Hoyt PA-C Entered By: Lenda KelpStone III, Hoyt on 06/10/2017 17:38:32

## 2017-06-20 ENCOUNTER — Encounter: Payer: Medicare Other | Admitting: Physician Assistant

## 2017-06-20 DIAGNOSIS — E11622 Type 2 diabetes mellitus with other skin ulcer: Secondary | ICD-10-CM | POA: Diagnosis not present

## 2017-06-22 ENCOUNTER — Telehealth: Payer: Self-pay

## 2017-06-22 NOTE — Telephone Encounter (Signed)
Met with [name] [name] with [employer].  Completed packed to send for lymphedema pump. Faxed to [phone]

## 2017-06-22 NOTE — Progress Notes (Addendum)
[name] [name], [name] [name] ([id number]) Visit Report for 06/20/2017 Chief Complaint Document Details Patient Name: [name] [name], [name] [name] Date of Service: 06/20/2017 10:15 AM Medical Record Number: [id number] Patient Account Number: [id number] Date of Birth/Sex: February 18, 1926 (84 y.o. Female) Treating RN: Curtis Sitesorthy, Joanna Primary Care Provider: Barbette ReichmannHande, Vishwanath Other Clinician: Referring Provider: Barbette ReichmannHande, Vishwanath Treating Provider/Extender: Linwood DibblesSTONE III, HOYT Weeks in Treatment: 4 Information Obtained from: Patient Chief Complaint Patient presents to the wound care center for a consult due non healing wound to the left lower extremity with bilateral massive swelling of the legs Electronic Signature(s) Signed: 06/22/2017 11:24:19 AM By: Lenda KelpStone III, Hoyt PA-C Entered By: Lenda KelpStone III, Hoyt on 06/20/2017 10:38:53 [place], [name] [name] ([id number]) -------------------------------------------------------------------------------- HPI Details Patient Name: [name] [name], [name] [name] Date of Service: 06/20/2017 10:15 AM Medical Record Number: [id number] Patient Account Number: [id number] Date of Birth/Sex: February 18, 1926 (84 y.o. Female) Treating RN: Curtis Sitesorthy, Joanna Primary Care Provider: Barbette ReichmannHande, Vishwanath Other Clinician: Referring Provider: Barbette ReichmannHande, Vishwanath Treating Provider/Extender: Linwood DibblesSTONE III, HOYT Weeks in Treatment: 4 History of Present Illness Location: bilateral swelling of the legs associated with ulceration on the left lower extremity Quality: Patient reports experiencing a dull pain to affected area(s). Severity: Patient states wound are getting worse. Duration: Patient has had the wound for > 3 months prior to seeking treatment at the wound center Timing: Pain in wound is Intermittent (comes and goes Context: The wound would happen gradually Modifying Factors: Other treatment(s) tried include:local care as per the PCP Associated Signs and Symptoms: Patient reports having increase swelling. HPI Description: 84 year old  patient seen by her PCP and referred to our center for left lower extremity stasis ulceration. She is known to have diabetes mellitus type 2, hypertension,status post cholecystectomy and open heart surgery, and atrial fibrillation and has bilateral lower extremity lymphedema with oozing of fluid. Her most recent hemoglobin A1c was 7.2. after assessment by the PCP she was put on torsemide 20 mg daily, echo was noted to have an ejection fraction of more than 55% with moderate MR, and the patient was maintained on warfarin for atrial fibrillation. [place] but has been traveling a bit and has not had any arterial or venous studies done recently. Her last echo was about a year and a half ago. 05/27/2017 -- she has not had any dressing changes since the last time she was here as a home health did not turn up. Her arterial duplex study is scheduled for next week. 06/03/2017 -- -- had a lower extremity arterial study done and there was arterial wall calcification and the resting ABI was noncompressible bilaterally. The right and left toe brachial indices were abnormal with the left first toe pressure being 76 mmHg and the right toe pressure was 89 mmHg. The right toe brachial index was 0.55 and the left toe brachial index was 0.47. 06/20/17 on evaluation today patient's wound appears to be doing fairly well and is not nearly as tender to palpation as when I last evaluated her. Fortunately she is having no evidence of infection. No fevers chills noted. She in fact told me that it really did not hurt much at all until I cleanse the wound. And then once I was done she also had no discomfort. Electronic Signature(s) Signed: 06/22/2017 11:24:19 AM By: Lenda KelpStone III, Hoyt PA-C Entered By: Lenda KelpStone III, Hoyt on 06/20/2017 10:44:54 [name] [name], [name] [name] ([id number]) -------------------------------------------------------------------------------- Physical Exam  Details Patient Name: [name] [name], [name] [name] Date of Service: 06/20/2017 10:15 AM Medical Record Number: [id number] Patient Account Number: [id number] Date of  Birth/Sex: 1926/05/19 (84 y.o. Female) Treating RN: Curtis Sites Primary Care Provider: Barbette Reichmann Other Clinician: Referring Provider: Barbette Reichmann Treating Provider/Extender: STONE III, HOYT Weeks in Treatment: 4 Constitutional Obese and well-hydrated in no acute distress. Respiratory normal breathing without difficulty. clear to auscultation bilaterally. Cardiovascular regular rate and rhythm with normal S1, S2. 1+ pitting edema of the bilateral lower extremities. Psychiatric this patient is able to make decisions and demonstrates good insight into disease process. Alert and Oriented x 3. pleasant and cooperative. Notes Patient has a good wound to bed noted on evaluation today and fortunately she has been showing signs of improvement in regard to the size of the wound. She is making progress albeit slow. Electronic Signature(s) Signed: 06/22/2017 11:24:19 AM By: Lenda Kelp PA-C Entered By: Lenda Kelp on 06/20/2017 10:45:46 [name] [name] ([id number]) -------------------------------------------------------------------------------- Physician Orders Details Patient Name: [name] [name] Date of Service: 06/20/2017 10:15 AM Medical Record Number: [id number] Patient Account Number: [id number] Date of Birth/Sex: 24-Oct-1925 (84 y.o. Female) Treating RN: Curtis Sites Primary Care Provider: Barbette Reichmann Other Clinician: Referring Provider: Barbette Reichmann Treating Provider/Extender: Linwood Dibbles, HOYT Weeks in Treatment: 4 Verbal / Phone Orders: No Diagnosis Coding ICD-10 Coding Code Description E11.622 Type 2 diabetes mellitus with other skin ulcer I89.0 Lymphedema, not elsewhere classified L97.222 Non-pressure chronic ulcer of left calf with fat layer exposed I50.22 Chronic systolic (congestive) heart  failure N18.3 Chronic kidney disease, stage 3 (moderate) Wound Cleansing Wound #1 Left,Posterior Lower Leg o Clean wound with Normal Saline. o May Shower, gently pat wound dry prior to applying new dressing. Anesthetic (add to Medication List) Wound #1 Left,Posterior Lower Leg o Topical Lidocaine 4% cream applied to wound bed prior to debridement (In Clinic Only). Primary Wound Dressing Wound #1 Left,Posterior Lower Leg o Prisma Ag Secondary Dressing Wound #1 Left,Posterior Lower Leg o ABD pad Dressing Change Frequency Wound #1 Left,Posterior Lower Leg o Change Dressing Monday, Wednesday, Friday Follow-up Appointments o Return Appointment in 1 week. Edema Control Wound #1 Left,Posterior Lower Leg o Kerlix and Coban - Bilateral - lightly from toes to 3cm below the knee - may anchor with unna wrap if needed Additional Orders / Instructions Wound #1 Left,Posterior Lower Leg o Increase protein intake. o Other: - Please try to keep blood sugars below 180. Please add over the counter vitamin C, multivitamin and zinc supplements to your diet. [name] [name], [name] [name] ([id number]) Home Health Wound #1 Left,Posterior Lower Leg o Continue Home Health Visits o Home Health Nurse may visit PRN to address patientos wound care needs. o FACE TO FACE ENCOUNTER: MEDICARE and MEDICAID PATIENTS: I certify that this patient is under my care and that I had a face-to-face encounter that meets the physician face-to-face encounter requirements with this patient on this date. The encounter with the patient was in whole or in part for the following MEDICAL CONDITION: (primary reason for Home Healthcare) MEDICAL NECESSITY: I certify, that based on my findings, NURSING services are a medically necessary home health service. HOME BOUND STATUS: I certify that my clinical findings support that this patient is homebound (i.e., Due to illness or injury, pt requires aid of supportive devices  such as crutches, cane, wheelchairs, walkers, the use of special transportation or the assistance of another person to leave their place of residence. There is a normal inability to leave the home and doing so requires considerable and taxing effort. Other absences are for medical reasons / religious services and are infrequent or of short duration when for other reasons).   o If current dressing causes regression in wound condition, may D/C ordered dressing product/s and apply Normal Saline Moist Dressing daily until next Wound Healing Center / Other MD appointment. Notify Wound Healing Center of regression in wound condition at [phone]. o Please direct any NON-WOUND related issues/requests for orders to patient's Primary Care Physician Electronic Signature(s) Signed: 06/20/2017 4:34:32 PM By: Curtis Sites Signed: 06/22/2017 11:24:19 AM By: Lenda Kelp PA-C Entered By: Curtis Sites on 06/20/2017 10:54:24 [name] [name], [name] [name] ([id number]) -------------------------------------------------------------------------------- Problem List Details Patient Name: [name] [name] Date of Service: 06/20/2017 10:15 AM Medical Record Number: [id number] Patient Account Number: [id number] Date of Birth/Sex: 1926-06-17 (84 y.o. Female) Treating RN: Curtis Sites Primary Care Provider: Barbette Reichmann Other Clinician: Referring Provider: Barbette Reichmann Treating Provider/Extender: Linwood Dibbles, HOYT Weeks in Treatment: 4 Active Problems ICD-10 Encounter Code Description Active Date Diagnosis E11.622 Type 2 diabetes mellitus with other skin ulcer 05/19/2017 Yes I89.0 Lymphedema, not elsewhere classified 05/19/2017 Yes L97.222 Non-pressure chronic ulcer of left calf with fat layer exposed 05/19/2017 Yes I50.22 Chronic systolic (congestive) heart failure 05/19/2017 Yes N18.3 Chronic kidney disease, stage 3 (moderate) 05/19/2017 Yes Inactive Problems Resolved Problems Electronic  Signature(s) Signed: 06/22/2017 11:24:19 AM By: Lenda Kelp PA-C Entered By: Lenda Kelp on 06/20/2017 10:38:46 [name] [name] ([id number]) -------------------------------------------------------------------------------- Progress Note Details Patient Name: [name] [name] Date of Service: 06/20/2017 10:15 AM Medical Record Number: [id number] Patient Account Number: [id number] Date of Birth/Sex: May 16, 1926 (84 y.o. Female) Treating RN: Curtis Sites Primary Care Provider: Barbette Reichmann Other Clinician: Referring Provider: Barbette Reichmann Treating Provider/Extender: Linwood Dibbles, HOYT Weeks in Treatment: 4 Subjective Chief Complaint Information obtained from Patient Patient presents to the wound care center for a consult due non healing wound to the left lower extremity with bilateral massive swelling of the legs History of Present Illness (HPI) The following HPI elements were documented for the patient's wound: Location: bilateral swelling of the legs associated with ulceration on the left lower extremity Quality: Patient reports experiencing a dull pain to affected area(s). Severity: Patient states wound are getting worse. Duration: Patient has had the wound for > 3 months prior to seeking treatment at the wound center Timing: Pain in wound is Intermittent (comes and goes Context: The wound would happen gradually Modifying Factors: Other treatment(s) tried include:local care as per the PCP Associated Signs and Symptoms: Patient reports having increase swelling. 84 year old patient seen by her PCP and referred to our center for left lower extremity stasis ulceration. She is known to have diabetes mellitus type 2, hypertension,status post cholecystectomy and open heart surgery, and atrial fibrillation and has bilateral lower extremity lymphedema with oozing of fluid. Her most recent hemoglobin A1c was 7.2. after assessment by the PCP she was put on torsemide 20 mg daily, echo  was noted to have an ejection fraction of more than 55% with moderate MR, and the patient was maintained on warfarin for atrial fibrillation. the patient has been living in [place] for about a year but has been traveling a bit and has not had any arterial or venous studies done recently. Her last echo was about a year and a half ago. 05/27/2017 -- she has not had any dressing changes since the last time she was here as a home health did not turn up. Her arterial duplex study is scheduled for next week. 06/03/2017 -- -- had a lower extremity arterial study done and there was arterial wall calcification and the resting ABI was noncompressible bilaterally. The right and left toe brachial indices  were abnormal with the left first toe pressure being 76 mmHg and the right toe pressure was 89 mmHg. The right toe brachial index was 0.55 and the left toe brachial index was 0.47. 06/20/17 on evaluation today patient's wound appears to be doing fairly well and is not nearly as tender to palpation as when I last evaluated her. Fortunately she is having no evidence of infection. No fevers chills noted. She in fact told me that it really did not hurt much at all until I cleanse the wound. And then once I was done she also had no discomfort. Patient History Information obtained from Patient. Family History Cancer - Child, Heart Disease - Mother, No family history of Diabetes, Hereditary Spherocytosis, Hypertension, Kidney Disease, Lung Disease, Seizures, Stroke, Thyroid Problems, Tuberculosis. [name] [name], [name] [name] ([id number]) Social History Former smoker - quit 60 years ago, Marital Status - Widowed, Alcohol Use - Never, Drug Use - No History, Caffeine Use - Daily. Medical And Surgical History Notes Eyes macular degeneration Review of Systems (ROS) Constitutional Symptoms (General Health) Denies complaints or symptoms of Fever, Chills. Respiratory The patient has no complaints or  symptoms. Cardiovascular Complains or has symptoms of LE edema. Psychiatric The patient has no complaints or symptoms. Objective Constitutional Obese and well-hydrated in no acute distress. Vitals Time Taken: 10:15 AM, Height: 63 in, Weight: 212 lbs, BMI: 37.6, Temperature: 98.0 F, Pulse: 69 bpm, Respiratory Rate: 20 breaths/min, Blood Pressure: 100/50 mmHg. Respiratory normal breathing without difficulty. clear to auscultation bilaterally. Cardiovascular regular rate and rhythm with normal S1, S2. 1+ pitting edema of the bilateral lower extremities. Psychiatric this patient is able to make decisions and demonstrates good insight into disease process. Alert and Oriented x 3. pleasant and cooperative. General Notes: Patient has a good wound to bed noted on evaluation today and fortunately she has been showing signs of improvement in regard to the size of the wound. She is making progress albeit slow. Integumentary (Hair, Skin) Wound #1 status is Open. Original cause of wound was Blister. The wound is located on the Left,Posterior Lower Leg. The wound measures 0.4cm length x 0.3cm width x 0.1cm depth; 0.094cm^2 area and 0.009cm^3 volume. There is no tunneling or undermining noted. There is a large amount of serous drainage noted. The wound margin is flat and intact. There is large (67-100%) pink granulation within the wound bed. There is no necrotic tissue within the wound bed. The periwound skin appearance did not exhibit: Callus, Crepitus, Excoriation, Induration, Rash, Scarring, Dry/Scaly, Maceration, Atrophie Blanche, Cyanosis, Ecchymosis, Hemosiderin Staining, Mottled, Pallor, Rubor, Erythema. Periwound temperature was noted as No Abnormality. The periwound has tenderness on palpation. [name] [name], [name] [name] ([id number]) Assessment Active Problems ICD-10 E11.622 - Type 2 diabetes mellitus with other skin ulcer I89.0 - Lymphedema, not elsewhere classified L97.222 - Non-pressure chronic  ulcer of left calf with fat layer exposed I50.22 - Chronic systolic (congestive) heart failure N18.3 - Chronic kidney disease, stage 3 (moderate) Plan Wound Cleansing: Wound #1 Left,Posterior Lower Leg: Clean wound with Normal Saline. May Shower, gently pat wound dry prior to applying new dressing. Anesthetic (add to Medication List): Wound #1 Left,Posterior Lower Leg: Topical Lidocaine 4% cream applied to wound bed prior to debridement (In Clinic Only). Primary Wound Dressing: Wound #1 Left,Posterior Lower Leg: Prisma Ag Secondary Dressing: Wound #1 Left,Posterior Lower Leg: ABD pad Dressing Change Frequency: Wound #1 Left,Posterior Lower Leg: Change Dressing Monday, Wednesday, Friday Follow-up Appointments: Return Appointment in 1 week. Edema Control: Wound #1 Left,Posterior Lower Leg: Kerlix and Coban -  Bilateral - lightly from toes to 3cm below the knee - may anchor with unna wrap if needed Additional Orders / Instructions: Wound #1 Left,Posterior Lower Leg: Increase protein intake. Other: - Please try to keep blood sugars below 180. Please add over the counter vitamin C, multivitamin and zinc supplements to your diet. Home Health: Wound #1 Left,Posterior Lower Leg: Continue Home Health Visits Home Health Nurse may visit PRN to address patient s wound care needs. FACE TO FACE ENCOUNTER: MEDICARE and MEDICAID PATIENTS: I certify that this patient is under my care and that I had a face-to-face encounter that meets the physician face-to-face encounter requirements with this patient on this date. The encounter with the patient was in whole or in part for the following MEDICAL CONDITION: (primary reason for Home Healthcare) MEDICAL NECESSITY: I certify, that based on my findings, NURSING services are a medically necessary home health service. HOME BOUND STATUS: I certify that my clinical findings support that this patient is homebound (i.e., Due to illness or injury, pt requires  aid of supportive devices such as crutches, cane, wheelchairs, walkers, the use of special transportation or the assistance of another person to leave their place of residence. There is a normal inability to leave the home and doing so requires considerable and taxing effort. Other absences are for medical reasons / religious services and [name] [name], [name] [name] ([id number]) are infrequent or of short duration when for other reasons). If current dressing causes regression in wound condition, may D/C ordered dressing product/s and apply Normal Saline Moist Dressing daily until next Wound Healing Center / Other MD appointment. Notify Wound Healing Center of regression in wound condition at [phone]. Please direct any NON-WOUND related issues/requests for orders to patient's Primary Care Physician At this point I'm going to recommend that we continue with the silver collagen dressing for the next week. See were things stand following. Please see above for specific wound care orders. We will see patient for re-evaluation in 1 week(s) here in the clinic. If anything worsens or changes patient will contact our office for additional recommendations. Electronic Signature(s) Signed: 08/09/2017 10:42:55 AM By: Lenda Kelp PA-C Previous Signature: 06/22/2017 11:24:19 AM Version By: Lenda Kelp PA-C Entered By: Lenda Kelp on 07/25/2017 09:02:51 [name] [name] ([id number]) -------------------------------------------------------------------------------- ROS/PFSH Details Patient Name: [name] [name] Date of Service: 06/20/2017 10:15 AM Medical Record Number: [id number] Patient Account Number: [id number] Date of Birth/Sex: 1925/11/23 (84 y.o. Female) Treating RN: Curtis Sites Primary Care Provider: Barbette Reichmann Other Clinician: Referring Provider: Barbette Reichmann Treating Provider/Extender: Linwood Dibbles, HOYT Weeks in Treatment: 4 Information Obtained From Patient Wound History Do you  currently have one or more open woundso Yes How many open wounds do you currently haveo 2 Approximately how long have you had your woundso 5 months How have you been treating your wound(s) until nowo camphil Has your wound(s) ever healed and then re-openedo No Have you had any lab work done in the past montho No Have you tested positive for an antibiotic resistant organism (MRSA, VRE)o No Have you tested positive for osteomyelitis (bone infection)o No Have you had any tests for circulation on your legso No Have you had other problems associated with your woundso Swelling Constitutional Symptoms (General Health) Complaints and Symptoms: Negative for: Fever; Chills Cardiovascular Complaints and Symptoms: Positive for: LE edema Medical History: Positive for: Arrhythmia - a fib; Congestive Heart Failure; Hypertension Negative for: Angina; Coronary Artery Disease; Deep Vein Thrombosis; Hypotension; Myocardial Infarction; Peripheral Arterial Disease; Peripheral Venous Disease; Phlebitis;  Vasculitis Eyes Medical History: Positive for: Cataracts - removed Past Medical History Notes: macular degeneration Hematologic/Lymphatic Medical History: Positive for: Anemia Negative for: Hemophilia; Human Immunodeficiency Virus; Lymphedema; Sickle Cell Disease Respiratory Complaints and Symptoms: No Complaints or Symptoms Medical History: Negative for: Aspiration; Asthma; Chronic Obstructive Pulmonary Disease (COPD); Pneumothorax; Sleep Apnea; [name] [name], [name] [name] ([id number]) Tuberculosis Gastrointestinal Medical History: Negative for: Cirrhosis ; Colitis; Crohnos; Hepatitis A; Hepatitis B; Hepatitis C Endocrine Medical History: Positive for: Type II Diabetes Treated with: Insulin Immunological Medical History: Negative for: Lupus Erythematosus; Raynaudos; Scleroderma Musculoskeletal Medical History: Negative for: Gout; Rheumatoid Arthritis; Osteoarthritis; Osteomyelitis Neurologic Medical  History: Negative for: Dementia; Neuropathy Oncologic Medical History: Negative for: Received Chemotherapy; Received Radiation Psychiatric Complaints and Symptoms: No Complaints or Symptoms HBO Extended History Items Eyes: Cataracts Immunizations Pneumococcal Vaccine: Received Pneumococcal Vaccination: Yes Immunization Notes: up to date Implantable Devices Family and Social History Cancer: Yes - Child; Diabetes: No; Heart Disease: Yes - Mother; Hereditary Spherocytosis: No; Hypertension: No; Kidney Disease: No; Lung Disease: No; Seizures: No; Stroke: No; Thyroid Problems: No; Tuberculosis: No; Former smoker - quit 60 years ago; Marital Status - Widowed; Alcohol Use: Never; Drug Use: No History; Caffeine Use: Daily; Financial Concerns: No; Food, Clothing or Shelter Needs: No; Support System Lacking: No; Transportation Concerns: No; Advanced Directives: No; Patient does not want information on Advanced Directives Physician [name] [name], CANLAS ([id number]) I have reviewed and agree with the above information. Electronic Signature(s) Signed: 06/20/2017 4:34:32 PM By: Curtis Sites Signed: 06/22/2017 11:24:19 AM By: Lenda Kelp PA-C Entered By: Lenda Kelp on 06/20/2017 10:45:25 [place], [name] Che ([id number]) -------------------------------------------------------------------------------- SuperBill Details Patient Name: [name] [name] Date of Service: 06/20/2017 Medical Record Number: [id number] Patient Account Number: [id number] Date of Birth/Sex: October 08, 1925 (84 y.o. Female) Treating RN: Curtis Sites Primary Care Provider: Barbette Reichmann Other Clinician: Referring Provider: Barbette Reichmann Treating Provider/Extender: Linwood Dibbles, HOYT Weeks in Treatment: 4 Diagnosis Coding ICD-10 Codes Code Description E11.622 Type 2 diabetes mellitus with other skin ulcer I89.0 Lymphedema, not elsewhere classified L97.222 Non-pressure chronic ulcer of left calf with fat  layer exposed I50.22 Chronic systolic (congestive) heart failure N18.3 Chronic kidney disease, stage 3 (moderate) Facility Procedures CPT4 Code: [id number] Description: 99213 - WOUND CARE VISIT-LEV 3 EST PT Modifier: Quantity: 1 Physician Procedures CPT4 Code: 3244010 Description: 99213 - WC PHYS LEVEL 3 - EST PT ICD-10 Diagnosis Description E11.622 Type 2 diabetes mellitus with other skin ulcer I89.0 Lymphedema, not elsewhere classified L97.222 Non-pressure chronic ulcer of left calf with fat layer expo I50.22  Chronic systolic (congestive) heart failure Modifier: sed Quantity: 1 Electronic Signature(s) Signed: 06/20/2017 11:59:08 AM By: Curtis Sites Signed: 06/22/2017 11:24:19 AM By: Lenda Kelp PA-C Entered By: Curtis Sites on 06/20/2017 11:59:08

## 2017-06-22 NOTE — Progress Notes (Signed)
[name] [name], [name] [name] ([id number]) Visit Report for 06/20/2017 Arrival Information Details Patient Name: [name] [name], [name] [name] Date of Service: 06/20/2017 10:15 AM Medical Record Number: [id number] Patient Account Number: [id number] Date of Birth/Sex: [birth year] (84 y.o. Female) Treating RN: Curtis Sitesorthy, Joanna Primary Care Heidi Lemay: Barbette ReichmannHande, Vishwanath Other Clinician: Referring Chandlar Staebell: Barbette ReichmannHande, Vishwanath Treating Dominigue Gellner/Extender: Linwood DibblesSTONE III, HOYT Weeks in Treatment: 4 Visit Information History Since Last Visit Added or deleted any medications: No Patient Arrived: Cane Any new allergies or adverse reactions: No Arrival Time: 10:13 Had a fall or experienced change in No Accompanied By: dtr activities of daily living that may affect Transfer Assistance: None risk of falls: Patient Identification Verified: Yes Signs or symptoms of abuse/neglect since last visito No Secondary Verification Process Yes Hospitalized since last visit: No Completed: Has Dressing in Place as Prescribed: Yes Patient Requires Transmission-Based No Has Compression in Place as Prescribed: Yes Precautions: Pain Present Now: No Patient Has Alerts: Yes Patient Alerts: Patient on Blood Thinner DMII warfarin Electronic Signature(s) Signed: 06/20/2017 4:34:32 PM By: Curtis Sitesorthy, Joanna Entered By: Curtis Sitesorthy, Joanna on 06/20/2017 10:15:05 [name] [name], Deven ([id number]) -------------------------------------------------------------------------------- Clinic Level of Care Assessment Details Patient Name: [name] [name], [name] [name] Date of Service: 06/20/2017 10:15 AM Medical Record Number: [id number] Patient Account Number: [id number] Date of Birth/Sex: [birth year] (84 y.o. Female) Treating RN: Curtis Sitesorthy, Joanna Primary Care Luken Shadowens: Barbette ReichmannHande, Vishwanath Other Clinician: Referring Merriam Brandner: Barbette ReichmannHande, Vishwanath Treating Lether Tesch/Extender: Linwood DibblesSTONE III, HOYT Weeks in Treatment: 4 Clinic Level of Care Assessment Items TOOL 4 Quantity Score []  - Use when  only an EandM is performed on FOLLOW-UP visit 0 ASSESSMENTS - Nursing Assessment / Reassessment X - Reassessment of Co-morbidities (includes updates in patient status) 1 10 X- 1 5 Reassessment of Adherence to Treatment Plan ASSESSMENTS - Wound and Skin Assessment / Reassessment X - Simple Wound Assessment / Reassessment - one wound 1 5 []  - 0 Complex Wound Assessment / Reassessment - multiple wounds []  - 0 Dermatologic / Skin Assessment (not related to wound area) ASSESSMENTS - Focused Assessment X - Circumferential Edema Measurements - multi extremities 1 5 []  - 0 Nutritional Assessment / Counseling / Intervention X- 1 5 Lower Extremity Assessment (monofilament, tuning fork, pulses) []  - 0 Peripheral Arterial Disease Assessment (using hand held doppler) ASSESSMENTS - Ostomy and/or Continence Assessment and Care []  - Incontinence Assessment and Management 0 []  - 0 Ostomy Care Assessment and Management (repouching, etc.) PROCESS - Coordination of Care X - Simple Patient / Family Education for ongoing care 1 15 []  - 0 Complex (extensive) Patient / Family Education for ongoing care []  - 0 Staff obtains ChiropractorConsents, Records, Test Results / Process Orders []  - 0 Staff telephones HHA, Nursing Homes / Clarify orders / etc []  - 0 Routine Transfer to another Facility (non-emergent condition) []  - 0 Routine Hospital Admission (non-emergent condition) []  - 0 New Admissions / Manufacturing engineernsurance Authorizations / Ordering NPWT, Apligraf, etc. []  - 0 Emergency Hospital Admission (emergent condition) X- 1 10 Simple Discharge Coordination [name] [name], [name] [name] ([id number]) []  - 0 Complex (extensive) Discharge Coordination PROCESS - Special Needs []  - Pediatric / Minor Patient Management 0 []  - 0 Isolation Patient Management []  - 0 Hearing / Language / Visual special needs []  - 0 Assessment of Community assistance (transportation, D/C planning, etc.) []  - 0 Additional assistance / Altered  mentation []  - 0 Support Surface(s) Assessment (bed, cushion, seat, etc.) INTERVENTIONS - Wound Cleansing / Measurement X - Simple Wound Cleansing - one wound 1 5 []  - 0 Complex Wound Cleansing - multiple wounds X- 1 5 Wound  Imaging (photographs - any number of wounds) []  - 0 Wound Tracing (instead of photographs) X- 1 5 Simple Wound Measurement - one wound []  - 0 Complex Wound Measurement - multiple wounds INTERVENTIONS - Wound Dressings []  - Small Wound Dressing one or multiple wounds 0 []  - 0 Medium Wound Dressing one or multiple wounds X- 1 20 Large Wound Dressing one or multiple wounds []  - 0 Application of Medications - topical []  - 0 Application of Medications - injection INTERVENTIONS - Miscellaneous []  - External ear exam 0 []  - 0 Specimen Collection (cultures, biopsies, blood, body fluids, etc.) []  - 0 Specimen(s) / Culture(s) sent or taken to Lab for analysis []  - 0 Patient Transfer (multiple staff / Nurse, adult / Similar devices) []  - 0 Simple Staple / Suture removal (25 or less) []  - 0 Complex Staple / Suture removal (26 or more) []  - 0 Hypo / Hyperglycemic Management (close monitor of Blood Glucose) []  - 0 Ankle / Brachial Index (ABI) - do not check if billed separately X- 1 5 Vital Signs [name] [name], [name] [name] ([id number]) Has the patient been seen at the hospital within the last three years: Yes Total Score: 95 Level Of Care: New/Established - Level 3 Electronic Signature(s) Signed: 06/20/2017 4:34:32 PM By: Curtis Sites Entered By: Curtis Sites on 06/20/2017 11:58:58 [name] [name] ([id number]) -------------------------------------------------------------------------------- Encounter Discharge Information Details Patient Name: [name] [name] Date of Service: 06/20/2017 10:15 AM Medical Record Number: [id number] Patient Account Number: [id number] Date of Birth/Sex: [birth year] (84 y.o. Female) Treating RN: Curtis Sites Primary Care Tinley Rought:  Barbette Reichmann Other Clinician: Referring Bradshaw Minihan: Barbette Reichmann Treating Jammie Troup/Extender: Linwood Dibbles, HOYT Weeks in Treatment: 4 Encounter Discharge Information Items Discharge Pain Level: 0 Discharge Condition: Stable Ambulatory Status: Ambulatory Discharge Destination: Home Transportation: Private Auto Accompanied By: dtr Schedule Follow-up Appointment: Yes Medication Reconciliation completed and No provided to Patient/Care Janifer Gieselman: Provided on Clinical Summary of Care: 06/20/2017 Form Type Recipient Paper Patient MS Electronic Signature(s) Signed: 06/20/2017 12:26:22 PM By: Curtis Sites Entered By: Curtis Sites on 06/20/2017 12:26:22 [name] [name] ([id number]) -------------------------------------------------------------------------------- Lower Extremity Assessment Details Patient Name: [name] [name] Date of Service: 06/20/2017 10:15 AM Medical Record Number: [id number] Patient Account Number: [id number] Date of Birth/Sex: 1925-09-22 (84 y.o. Female) Treating RN: Curtis Sites Primary Care Jionni Helming: Barbette Reichmann Other Clinician: Referring Nashira Mcglynn: Barbette Reichmann Treating Quentina Fronek/Extender: Linwood Dibbles, HOYT Weeks in Treatment: 4 Edema Assessment Assessed: [Left: No] [Right: No] Edema: [Left: Yes] [Right: Yes] Calf Left: Right: Point of Measurement: 32 cm From Medial Instep 44.7 cm 42.8 cm Ankle Left: Right: Point of Measurement: 9 cm From Medial Instep 28.5 cm 25 cm Vascular Assessment Pulses: Dorsalis Pedis Palpable: [Left:Yes] [Right:Yes] Posterior Tibial Extremity colors, hair growth, and conditions: Extremity Color: [Left:Hyperpigmented] [Right:Hyperpigmented] Hair Growth on Extremity: [Left:No] [Right:No] Temperature of Extremity: [Left:Warm] [Right:Warm] Capillary Refill: [Left:< 3 seconds] [Right:< 3 seconds] Electronic Signature(s) Signed: 06/20/2017 4:34:32 PM By: Curtis Sites Entered By: Curtis Sites on 06/20/2017  10:23:48 [name] [name] ([id number]) -------------------------------------------------------------------------------- Multi Wound Chart Details Patient Name: [name] [name] Date of Service: 06/20/2017 10:15 AM Medical Record Number: [id number] Patient Account Number: [id number] Date of Birth/Sex: 06-27-1925 (84 y.o. 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] [N/A:N/A] Secondary Etiology: [1:Lymphedema] [N/A:N/A] Comorbid History: [1:Cataracts, Anemia, Arrhythmia, Congestive Heart Failure, Hypertension, Type II Diabetes] [N/A:N/A] Date Acquired: [1:12/28/2016] [N/A:N/A] Weeks of Treatment: [1:4] [N/A:N/A] Wound Status: [1:Open] [N/A:N/A] Measurements L x W x D [1:0.1x0.1x0.1] [N/A:N/A] (cm) Area (cm) : [1:0.008] [N/A:N/A] Volume (cm) : [1:0.001] [N/A:N/A] % Reduction in Area: [1:96.60%] [N/A:N/A] % Reduction in Volume: [1:95.80%] [N/A:N/A] Classification: [1:Grade 1] [N/A:N/A] Exudate Amount: [1:Large] [N/A:N/A] Exudate Type: [1:Serous] [N/A:N/A] Exudate Color: [1:amber] [N/A:N/A] Wound Margin: [1:Flat and Intact] [N/A:N/A] Granulation Amount: [1:Large (67-100%)] [N/A:N/A] Granulation Quality: [1:Pink] [N/A:N/A] Necrotic Amount: [1:None Present (0%)] [N/A:N/A] Exposed Structures: [1:Fascia: No Fat Layer (Subcutaneous Tissue) Exposed: No Tendon: No Muscle: No Joint: No Bone: No] [N/A:N/A] Epithelialization: [1:Large (67-100%)] [N/A:N/A] Periwound Skin  Texture: [1:Excoriation: No Induration: No] [N/A:N/A] Callus: No Crepitus: No Rash: No Scarring: No Periwound Skin Moisture: Maceration: No N/A N/A Dry/Scaly: No Periwound Skin Color: Atrophie Blanche: No N/A N/A Cyanosis: No Ecchymosis: No Erythema: No Hemosiderin Staining: No Mottled: No Pallor: No Rubor: No Temperature: No Abnormality N/A N/A Tenderness on Palpation: Yes N/A N/A Wound Preparation: Ulcer Cleansing: N/A N/A Rinsed/Irrigated with Saline Topical Anesthetic Applied: Other: lidocaine 4% Treatment Notes Electronic Signature(s) Signed: 06/20/2017 4:34:32 PM By: Curtis Sites Entered By: Curtis Sites on 06/20/2017 10:41:19 [name] [name] ([id number]) -------------------------------------------------------------------------------- Multi-Disciplinary Care Plan Details Patient Name: [name] [name] Date of Service: 06/20/2017 10:15 AM Medical Record Number: [id number] Patient Account Number: [id number] Date of Birth/Sex: May 15, 1926 (84 y.o. Female) Treating RN: Curtis Sites Primary Care Averiana Clouatre: Barbette Reichmann Other Clinician: Referring Cesario Weidinger: Barbette Reichmann Treating Amonte Brookover/Extender: Linwood Dibbles, HOYT Weeks in Treatment: 4 Active Inactive ` Abuse / Safety / Falls / Self Care Management Nursing Diagnoses: Impaired physical mobility Goals: Patient will not experience any injury related to falls Date Initiated: 05/19/2017 Target Resolution Date: 07/30/2017 Goal Status: Active Interventions: Assess fall risk on admission and as needed Notes: ` Orientation to the Wound Care Program Nursing Diagnoses: Knowledge deficit related to the wound healing center program Goals: Patient/caregiver will verbalize understanding of the Wound Healing Center Program Date Initiated: 05/19/2017 Target Resolution Date: 07/30/2017 Goal Status: Active Interventions: Provide education on orientation to the wound center Notes: ` Wound/Skin Impairment Nursing  Diagnoses: Impaired tissue integrity Goals: Ulcer/skin breakdown will heal within 14 weeks Date Initiated: 05/19/2017 Target Resolution Date: 07/30/2017 Goal Status: Active Interventions: [name] [name], [name] [name] ([id number]) Assess patient/caregiver ability to obtain necessary supplies Assess patient/caregiver ability to perform ulcer/skin care regimen upon admission and as needed Assess ulceration(s) every visit Notes: Electronic Signature(s) Signed: 06/20/2017 4:34:32 PM By: Curtis Sites Entered By: Curtis Sites on 06/20/2017 10:41:13 [name] [name] ([id number]) -------------------------------------------------------------------------------- Pain Assessment Details Patient Name: [name] [name] Date of Service: 06/20/2017 10:15 AM Medical Record Number: [id number] Patient Account Number: [id number] Date of Birth/Sex: 1925/11/17 (84 y.o. Female) Treating RN: Curtis Sites Primary Care Mckinsley Koelzer: Barbette Reichmann Other Clinician: Referring Kveon Casanas: Barbette Reichmann Treating Chelan Heringer/Extender: Linwood Dibbles, HOYT Weeks in Treatment: 4 Active Problems Location of Pain Severity and Description of Pain Patient Has Paino No Site Locations Pain Management and Medication Current Pain Management: Electronic Signature(s) Signed: 06/20/2017 4:34:32 PM By: Curtis Sites Entered By: Curtis Sites on 06/20/2017 10:15:42 [name] [name] ([id number]) -------------------------------------------------------------------------------- Patient/Caregiver Education Details Patient Name: [name] [name] Date of Service: 06/20/2017 10:15 AM Medical Record Number: [id number] Patient Account Number: [id number] Date of Birth/Gender: 05-15-1926 (84 y.o. Female) Treating RN: Curtis Sites Primary Care Physician: Barbette Reichmann Other Clinician: Referring Physician: Barbette Reichmann Treating Physician/Extender: Skeet Simmer in Treatment: 4 Education Assessment Education  Provided  To: Patient Education Topics Provided Wound/Skin Impairment: Handouts: Other: wound care as ordered Methods: Demonstration, Explain/Verbal Responses: State content correctly Electronic Signature(s) Signed: 06/20/2017 4:34:32 PM By: Curtis Sites Entered By: Curtis Sites on 06/20/2017 12:27:01 [name] [name] ([id number]) -------------------------------------------------------------------------------- Wound Assessment Details Patient Name: [name] [name] Date of Service: 06/20/2017 10:15 AM Medical Record Number: [id number] Patient Account Number: [id number] Date of Birth/Sex: 1926-03-09 (84 y.o. Female) Treating RN: Curtis Sites Primary Care Khai Arrona: Barbette Reichmann Other Clinician: Referring Jasmina Gendron: Barbette Reichmann Treating Farhaan Mabee/Extender: Linwood Dibbles, HOYT Weeks in Treatment: 4 Wound Status Wound Number: 1 Primary Diabetic Wound/Ulcer of the Lower Extremity Etiology: Wound Location: Left, Posterior Lower Leg Secondary Lymphedema Wounding Event: Blister Etiology: Date Acquired: 12/28/2016 Wound Status: Open Weeks Of Treatment: 4 Comorbid Cataracts, Anemia, Arrhythmia, Congestive Clustered Wound: No History: Heart Failure, Hypertension, Type II Diabetes Photos Photo Uploaded By: Curtis Sites on 06/20/2017 16:33:24 Wound Measurements Length: (cm) 0.4 Width: (cm) 0.3 Depth: (cm) 0.1 Area: (cm) 0.094 Volume: (cm) 0.009 % Reduction in Area: 60.2% % Reduction in Volume: 62.5% Epithelialization: Large (67-100%) Tunneling: No Undermining: No Wound Description Classification: Grade 1 Wound Margin: Flat and Intact Exudate Amount: Large Exudate Type: Serous Exudate Color: amber Foul Odor After Cleansing: No Slough/Fibrino Yes Wound Bed Granulation Amount: Large (67-100%) Exposed Structure Granulation Quality: Pink Fascia Exposed: No Necrotic Amount: None Present (0%) Fat Layer (Subcutaneous Tissue) Exposed: No Tendon Exposed: No Muscle Exposed:  No Joint Exposed: No Bone Exposed: No Periwound Skin Texture [name] [name], [name] [name] ([id number]) Texture Color No Abnormalities Noted: No No Abnormalities Noted: No Callus: No Atrophie Blanche: No Crepitus: No Cyanosis: No Excoriation: No Ecchymosis: No Induration: No Erythema: No Rash: No Hemosiderin Staining: No Scarring: No Mottled: No Pallor: No Moisture Rubor: No No Abnormalities Noted: No Dry / Scaly: No Temperature / Pain Maceration: No Temperature: No Abnormality Tenderness on Palpation: Yes Wound Preparation Ulcer Cleansing: Rinsed/Irrigated with Saline Topical Anesthetic Applied: Other: lidocaine 4%, Treatment Notes Wound #1 (Left, Posterior Lower Leg) 1. Cleansed with: Clean wound with Normal Saline 4. Dressing Applied: Prisma Ag 5. Secondary Dressing Applied ABD Pad 7. Secured with Other (specify in notes) Notes kerlix and coban wrap, unna to Ecologist) Signed: 06/20/2017 4:34:32 PM By: Curtis Sites Entered By: Curtis Sites on 06/20/2017 10:41:43 [name] [name] ([id number]) -------------------------------------------------------------------------------- Vitals Details Patient Name: [name] [name] Date of Service: 06/20/2017 10:15 AM Medical Record Number: [id number] Patient Account Number: [id number] Date of Birth/Sex: April 26, 1926 (84 y.o. Female) Treating RN: Curtis Sites Primary Care Larua Collier: Barbette Reichmann Other Clinician: Referring Ashawna Hanback: Barbette Reichmann Treating Melaysia Streed/Extender: Linwood Dibbles, HOYT Weeks in Treatment: 4 Vital Signs Time Taken: 10:15 Temperature (F): 98.0 Height (in): 63 Pulse (bpm): 69 Weight (lbs): 212 Respiratory Rate (breaths/min): 20 Body Mass Index (BMI): 37.6 Blood Pressure (mmHg): 100/50 Reference Range: 80 - 120 mg / dl Electronic Signature(s) Signed: 06/20/2017 4:34:32 PM By: Curtis Sites Entered By: Curtis Sites on 06/20/2017 10:17:48

## 2017-06-27 ENCOUNTER — Encounter: Payer: Medicare Other | Attending: Physician Assistant | Admitting: Physician Assistant

## 2017-06-27 DIAGNOSIS — Z794 Long term (current) use of insulin: Secondary | ICD-10-CM | POA: Diagnosis not present

## 2017-06-27 DIAGNOSIS — E11622 Type 2 diabetes mellitus with other skin ulcer: Secondary | ICD-10-CM | POA: Insufficient documentation

## 2017-06-27 DIAGNOSIS — I890 Lymphedema, not elsewhere classified: Secondary | ICD-10-CM | POA: Diagnosis not present

## 2017-06-27 DIAGNOSIS — N183 Chronic kidney disease, stage 3 (moderate): Secondary | ICD-10-CM | POA: Insufficient documentation

## 2017-06-27 DIAGNOSIS — Z7901 Long term (current) use of anticoagulants: Secondary | ICD-10-CM | POA: Diagnosis not present

## 2017-06-27 DIAGNOSIS — I130 Hypertensive heart and chronic kidney disease with heart failure and stage 1 through stage 4 chronic kidney disease, or unspecified chronic kidney disease: Secondary | ICD-10-CM | POA: Insufficient documentation

## 2017-06-27 DIAGNOSIS — E1122 Type 2 diabetes mellitus with diabetic chronic kidney disease: Secondary | ICD-10-CM | POA: Diagnosis not present

## 2017-06-27 DIAGNOSIS — I5022 Chronic systolic (congestive) heart failure: Secondary | ICD-10-CM | POA: Insufficient documentation

## 2017-06-27 DIAGNOSIS — L97222 Non-pressure chronic ulcer of left calf with fat layer exposed: Secondary | ICD-10-CM | POA: Insufficient documentation

## 2017-06-27 DIAGNOSIS — Z87891 Personal history of nicotine dependence: Secondary | ICD-10-CM | POA: Diagnosis not present

## 2017-06-27 DIAGNOSIS — H3530 Unspecified macular degeneration: Secondary | ICD-10-CM | POA: Insufficient documentation

## 2017-06-27 DIAGNOSIS — I4891 Unspecified atrial fibrillation: Secondary | ICD-10-CM | POA: Insufficient documentation

## 2017-06-28 NOTE — Progress Notes (Signed)
[name] [name], Jeraline ([id number]) Visit Report for 06/27/2017 Chief Complaint Document Details Patient Name: [name] [name], [name] [name] Date of Service: 06/27/2017 11:00 AM Medical Record Number: [id number] Patient Account Number: [id number] Date of Birth/Sex: 05/31/1926 (84 y.o. Female) Treating RN: Curtis Sitesorthy, Joanna Primary Care Provider: Barbette ReichmannHande, Vishwanath Other Clinician: Referring Provider: Barbette ReichmannHande, Vishwanath Treating Provider/Extender: Linwood DibblesSTONE III, Natosha Bou Weeks in Treatment: 5 Information Obtained from: Patient Chief Complaint Patient presents to the wound care center for a consult due non healing wound to the left lower extremity with bilateral massive swelling of the legs Electronic Signature(s) Signed: 06/27/2017 5:30:29 PM By: Lenda KelpStone III, Mihran Lebarron PA-C Entered By: Lenda KelpStone III, Jahmere Bramel on 06/27/2017 11:14:25 [name] [name], [name] [name] ([id number]) -------------------------------------------------------------------------------- HPI Details Patient Name: [name] [name], [name] [name] Date of Service: 06/27/2017 11:00 AM Medical Record Number: [id number] Patient Account Number: [id number] Date of Birth/Sex: 05/31/1926 (84 y.o. Female) Treating RN: Curtis Sitesorthy, Joanna Primary Care Provider: Barbette ReichmannHande, Vishwanath Other Clinician: Referring Provider: Barbette ReichmannHande, Vishwanath Treating Provider/Extender: Linwood DibblesSTONE III, Kaziah Krizek Weeks in Treatment: 5 History of Present Illness Location: bilateral swelling of the legs associated with ulceration on the left lower extremity Quality: Patient reports experiencing a dull pain to affected area(s). Severity: Patient states wound are getting worse. Duration: Patient has had the wound for > 3 months prior to seeking treatment at the wound center Timing: Pain in wound is Intermittent (comes and goes Context: The wound would happen gradually Modifying Factors: Other treatment(s) tried include:local care as per the PCP Associated Signs and Symptoms: Patient reports having increase swelling. HPI Description: 84 year old patient seen  by her PCP and referred to our center for left lower extremity stasis ulceration. She is known to have diabetes mellitus type 2, hypertension,status post cholecystectomy and open heart surgery, and atrial fibrillation and has bilateral lower extremity lymphedema with oozing of fluid. Her most recent hemoglobin A1c was 7.2. after assessment by the PCP she was put on torsemide 20 mg daily, echo was noted to have an ejection fraction of more than 55% with moderate MR, and the patient was maintained on warfarin for atrial fibrillation. the [place] in [place] about a year but has been traveling a bit and has not had any arterial or venous studies done recently. Her last echo was about a year and a half ago. 05/27/2017 -- she has not had any dressing changes since the last time she was here as a home health did not turn up. Her arterial duplex study is scheduled for next week. 06/03/2017 -- -- had a lower extremity arterial study done and there was arterial wall calcification and the resting ABI was noncompressible bilaterally. The right and left toe brachial indices were abnormal with the left first toe pressure being 76 mmHg and the right toe pressure was 89 mmHg. The right toe brachial index was 0.55 and the left toe brachial index was 0.47. 06/27/16 on evaluation today patient appears to be doing well in regard to her left leg ulcer. This appears to be much smaller and she really is not having any significant discomfort. With that being said her wound does appear to be quite a bit smaller which is excellent news. She has noted continual decrease week by week in the wound status. The good news is also her daughter did order the compression therapy from [place] and that is in the mail and should arrive soon. Electronic Signature(s) Signed: 06/27/2017 5:30:29 PM By: Lenda KelpStone III, Wahneta Derocher PA-C Entered By: Lenda KelpStone III, Dyasia Firestine on 06/27/2017 11:22:42 [name] [name], Dalina  ([id number]) -------------------------------------------------------------------------------- Physical Exam Details Patient Name:  [name] [name] Date of Service: 06/27/2017 11:00 AM Medical Record Number: [id number] Patient Account Number: [id number] Date of Birth/Sex: 1926-02-06 (84 y.o. Female) Treating RN: Curtis Sites Primary Care Provider: Barbette Reichmann Other Clinician: Referring Provider: Barbette Reichmann Treating Provider/Extender: STONE III, Shakiya Mcneary Weeks in Treatment: 5 Constitutional Obese and well-hydrated in no acute distress. Respiratory normal breathing without difficulty. clear to auscultation bilaterally. Cardiovascular regular rate and rhythm with normal S1, S2. 1+ pitting edema of the bilateral lower extremities. Psychiatric this patient is able to make decisions and demonstrates good insight into disease process. Alert and Oriented x 3. pleasant and cooperative. Notes Patient's wound shows no slough at this point in time and is very small with a good granulation bed. Her swelling appears to be very well controlled. Electronic Signature(s) Signed: 06/27/2017 5:30:29 PM By: Lenda Kelp PA-C Entered By: Lenda Kelp on 06/27/2017 11:23:45 [name] [name] ([id number]) -------------------------------------------------------------------------------- Physician Orders Details Patient Name: [name] [name] Date of Service: 06/27/2017 11:00 AM Medical Record Number: [id number] Patient Account Number: [id number] Date of Birth/Sex: 19-Jan-1926 (84 y.o. Female) Treating RN: Curtis Sites Primary Care Provider: Barbette Reichmann Other Clinician: Referring Provider: Barbette Reichmann Treating Provider/Extender: Linwood Dibbles, Ferris Fielden Weeks in Treatment: 5 Verbal / Phone Orders: No Diagnosis Coding ICD-10 Coding Code Description E11.622 Type 2 diabetes mellitus with other skin ulcer I89.0 Lymphedema, not elsewhere classified L97.222 Non-pressure chronic ulcer of left calf  with fat layer exposed I50.22 Chronic systolic (congestive) heart failure N18.3 Chronic kidney disease, stage 3 (moderate) Wound Cleansing Wound #1 Left,Posterior Lower Leg o Clean wound with Normal Saline. o May Shower, gently pat wound dry prior to applying new dressing. Anesthetic (add to Medication List) Wound #1 Left,Posterior Lower Leg o Topical Lidocaine 4% cream applied to wound bed prior to debridement (In Clinic Only). Primary Wound Dressing Wound #1 Left,Posterior Lower Leg o Prisma Ag Secondary Dressing Wound #1 Left,Posterior Lower Leg o ABD pad Dressing Change Frequency Wound #1 Left,Posterior Lower Leg o Change Dressing Monday, Wednesday, Friday Follow-up Appointments o Return Appointment in 1 week. Edema Control Wound #1 Left,Posterior Lower Leg o Kerlix and Coban - Bilateral - lightly from toes to 3cm below the knee - may anchor with unna wrap if needed Additional Orders / Instructions Wound #1 Left,Posterior Lower Leg o Increase protein intake. o Other: - Please try to keep blood sugars below 180. Please add over the counter vitamin C, multivitamin and zinc supplements to your diet. ARTEMISIA, AUVIL ([id number]) Home Health Wound #1 Left,Posterior Lower Leg o Continue Home Health Visits - [facility] o Home Health Nurse may visit PRN to address patientos wound care needs. o FACE TO FACE ENCOUNTER: MEDICARE and MEDICAID PATIENTS: I certify that this patient is under my care and that I had a face-to-face encounter that meets the physician face-to-face encounter requirements with this patient on this date. The encounter with the patient was in whole or in part for the following MEDICAL CONDITION: (primary reason for Home Healthcare) MEDICAL NECESSITY: I certify, that based on my findings, NURSING services are a medically necessary home health service. HOME BOUND STATUS: I certify that my clinical findings support that this patient is  homebound (i.e., Due to illness or injury, pt requires aid of supportive devices such as crutches, cane, wheelchairs, walkers, the use of special transportation or the assistance of another person to leave their place of residence. There is a normal inability to leave the home and doing so requires considerable and taxing effort. Other absences are for medical reasons /  religious services and are infrequent or of short duration when for other reasons). o If current dressing causes regression in wound condition, may D/C ordered dressing product/s and apply Normal Saline Moist Dressing daily until next Wound Healing Center / Other MD appointment. Notify Wound Healing Center of regression in wound condition at [phone]. o Please direct any NON-WOUND related issues/requests for orders to patient's Primary Care Physician Electronic Signature(s) Signed: 06/27/2017 5:08:30 PM By: Curtis Sites Signed: 06/27/2017 5:30:29 PM By: Lenda Kelp PA-C Entered By: Curtis Sites on 06/27/2017 11:16:31 [name] [name] ([id number]) -------------------------------------------------------------------------------- Problem List Details Patient Name: [name] [name] Date of Service: 06/27/2017 11:00 AM Medical Record Number: [id number] Patient Account Number: [id number] Date of Birth/Sex: 03/04/1926 (84 y.o. Female) Treating RN: Curtis Sites Primary Care Provider: Barbette Reichmann Other Clinician: Referring Provider: Barbette Reichmann Treating Provider/Extender: Linwood Dibbles, Narcissa Melder Weeks in Treatment: 5 Active Problems ICD-10 Encounter Code Description Active Date Diagnosis E11.622 Type 2 diabetes mellitus with other skin ulcer 05/19/2017 Yes I89.0 Lymphedema, not elsewhere classified 05/19/2017 Yes L97.222 Non-pressure chronic ulcer of left calf with fat layer exposed 05/19/2017 Yes I50.22 Chronic systolic (congestive) heart failure 05/19/2017 Yes N18.3 Chronic kidney disease, stage 3 (moderate)  05/19/2017 Yes Inactive Problems Resolved Problems Electronic Signature(s) Signed: 06/27/2017 5:30:29 PM By: Lenda Kelp PA-C Entered By: Lenda Kelp on 06/27/2017 11:14:18 [name] [name] ([id number]) -------------------------------------------------------------------------------- Progress Note Details Patient Name: [name] [name] Date of Service: 06/27/2017 11:00 AM Medical Record Number: [id number] Patient Account Number: [id number] Date of Birth/Sex: 04/05/1926 (84 y.o. Female) Treating RN: Curtis Sites Primary Care Provider: Barbette Reichmann Other Clinician: Referring Provider: Barbette Reichmann Treating Provider/Extender: Linwood Dibbles, Kaymarie Wynn Weeks in Treatment: 5 Subjective Chief Complaint Information obtained from Patient Patient presents to the wound care center for a consult due non healing wound to the left lower extremity with bilateral massive swelling of the legs History of Present Illness (HPI) The following HPI elements were documented for the patient's wound: Location: bilateral swelling of the legs associated with ulceration on the left lower extremity Quality: Patient reports experiencing a dull pain to affected area(s). Severity: Patient states wound are getting worse. Duration: Patient has had the wound for > 3 months prior to seeking treatment at the wound center Timing: Pain in wound is Intermittent (comes and goes Context: The wound would happen gradually Modifying Factors: Other treatment(s) tried include:local care as per the PCP Associated Signs and Symptoms: Patient reports having increase swelling. 84 year old patient seen by her PCP and referred to our center for left lower extremity stasis ulceration. She is known to have diabetes mellitus type 2, hypertension,status post cholecystectomy and open heart surgery, and atrial fibrillation and has bilateral lower extremity lymphedema with oozing of fluid. Her most recent hemoglobin A1c was 7.2. after  assessment by the PCP she was put on torsemide 20 mg daily, echo was noted to have an ejection fraction of more than 55% with moderate MR, and the patient was maintained on warfarin for atrial fibrillation. the patient has been living in [place] for about a year but has been traveling a bit and has not had any arterial or venous studies done recently. Her last echo was about a year and a half ago. 05/27/2017 -- she has not had any dressing changes since the last time she was here as a home health did not turn up. Her arterial duplex study is scheduled for next week. 06/03/2017 -- -- had a lower extremity arterial study done and there was arterial wall calcification and  the resting ABI was noncompressible bilaterally. The right and left toe brachial indices were abnormal with the left first toe pressure being 76 mmHg and the right toe pressure was 89 mmHg. The right toe brachial index was 0.55 and the left toe brachial index was 0.47. 06/27/16 on evaluation today patient appears to be doing well in regard to her left leg ulcer. This appears to be much smaller and she really is not having any significant discomfort. With that being said her wound does appear to be quite a bit smaller which is excellent news. She has noted continual decrease week by week in the wound status. The good news is also her daughter did order the compression therapy from [place] and that is in the mail and should arrive soon. Patient History Information obtained from Patient. Family History Cancer - Child, Heart Disease - Mother, No family history of Diabetes, Hereditary Spherocytosis, Hypertension, Kidney Disease, Lung Disease, Seizures, Stroke, Thyroid Problems, Tuberculosis. [name] [name], [name] [name] ([id number]) Social History Former smoker - quit 60 years ago, Marital Status - Widowed, Alcohol Use - Never, Drug Use - No History, Caffeine Use - Daily. Medical And Surgical History Notes Eyes macular  degeneration Review of Systems (ROS) Constitutional Symptoms (General Health) Denies complaints or symptoms of Fever, Chills. Respiratory The patient has no complaints or symptoms. Cardiovascular Complains or has symptoms of LE edema. Psychiatric The patient has no complaints or symptoms. Objective Constitutional Obese and well-hydrated in no acute distress. Vitals Time Taken: 11:01 AM, Height: 63 in, Weight: 212 lbs, BMI: 37.6, Temperature: 98.1 F, Pulse: 66 bpm, Respiratory Rate: 20 breaths/min, Blood Pressure: 98/73 mmHg. Respiratory normal breathing without difficulty. clear to auscultation bilaterally. Cardiovascular regular rate and rhythm with normal S1, S2. 1+ pitting edema of the bilateral lower extremities. Psychiatric this patient is able to make decisions and demonstrates good insight into disease process. Alert and Oriented x 3. pleasant and cooperative. General Notes: Patient's wound shows no slough at this point in time and is very small with a good granulation bed. Her swelling appears to be very well controlled. Integumentary (Hair, Skin) Wound #1 status is Open. Original cause of wound was Blister. The wound is located on the Left,Posterior Lower Leg. The wound measures 0.2cm length x 0.2cm width x 0.1cm depth; 0.031cm^2 area and 0.003cm^3 volume. There is no tunneling or undermining noted. There is a large amount of serous drainage noted. The wound margin is flat and intact. There is large (67-100%) pink granulation within the wound bed. There is no necrotic tissue within the wound bed. The periwound skin appearance did not exhibit: Callus, Crepitus, Excoriation, Induration, Rash, Scarring, Dry/Scaly, Maceration, Atrophie Blanche, Cyanosis, Ecchymosis, Hemosiderin Staining, Mottled, Pallor, Rubor, Erythema. Periwound temperature was noted as No Abnormality. The periwound has tenderness on palpation. [name] [name], [name] [name] ([id number]) Assessment Active  Problems ICD-10 E11.622 - Type 2 diabetes mellitus with other skin ulcer I89.0 - Lymphedema, not elsewhere classified L97.222 - Non-pressure chronic ulcer of left calf with fat layer exposed I50.22 - Chronic systolic (congestive) heart failure N18.3 - Chronic kidney disease, stage 3 (moderate) Plan Wound Cleansing: Wound #1 Left,Posterior Lower Leg: Clean wound with Normal Saline. May Shower, gently pat wound dry prior to applying new dressing. Anesthetic (add to Medication List): Wound #1 Left,Posterior Lower Leg: Topical Lidocaine 4% cream applied to wound bed prior to debridement (In Clinic Only). Primary Wound Dressing: Wound #1 Left,Posterior Lower Leg: Prisma Ag Secondary Dressing: Wound #1 Left,Posterior Lower Leg: ABD pad Dressing Change Frequency: Wound #1 Left,Posterior Lower  Leg: Change Dressing Monday, Wednesday, Friday Follow-up Appointments: Return Appointment in 1 week. Edema Control: Wound #1 Left,Posterior Lower Leg: Kerlix and Coban - Bilateral - lightly from toes to 3cm below the knee - may anchor with unna wrap if needed Additional Orders / Instructions: Wound #1 Left,Posterior Lower Leg: Increase protein intake. Other: - Please try to keep blood sugars below 180. Please add over the counter vitamin C, multivitamin and zinc supplements to your diet. Home Health: Wound #1 Left,Posterior Lower Leg: Continue Home Health Visits - [facility] Nurse may visit PRN to address patient s wound care needs. FACE TO FACE ENCOUNTER: MEDICARE and MEDICAID PATIENTS: I certify that this patient is under my care and that I had a face-to-face encounter that meets the physician face-to-face encounter requirements with this patient on this date. The encounter with the patient was in whole or in part for the following MEDICAL CONDITION: (primary reason for Home Healthcare) MEDICAL NECESSITY: I certify, that based on my findings, NURSING services are a medically  necessary home health service. HOME BOUND STATUS: I certify that my clinical findings support that this patient is homebound (i.e., Due to illness or injury, pt requires aid of supportive devices such as crutches, cane, wheelchairs, walkers, the use of special transportation or the assistance of another person to leave their place of residence. There is a normal inability to leave the home and doing so requires considerable and taxing effort. Other absences are for medical reasons / religious services and [name] [name], [name] [name] ([id number]) are infrequent or of short duration when for other reasons). If current dressing causes regression in wound condition, may D/C ordered dressing product/s and apply Normal Saline Moist Dressing daily until next Wound Healing Center / Other MD appointment. Notify Wound Healing Center of regression in wound condition at [phone]. Please direct any NON-WOUND related issues/requests for orders to patient's Primary Care Physician I'm going to recommend that we continue with the Current wound care measures as patient seems to be doing very well at this time. Patient and her daughter are in agreement with the plan. We will see were things stand at next follow-up. Please see above for specific wound care orders. We will see patient for re-evaluation in 1 week(s) here in the clinic. If anything worsens or changes patient will contact our office for additional recommendations. Electronic Signature(s) Signed: 06/27/2017 5:30:29 PM By: Lenda Kelp PA-C Entered By: Lenda Kelp on 06/27/2017 11:24:03 [name] [name] ([id number]) -------------------------------------------------------------------------------- ROS/PFSH Details Patient Name: [name] [name] Date of Service: 06/27/2017 11:00 AM Medical Record Number: [id number] Patient Account Number: [id number] Date of Birth/Sex: 09-Nov-1925 (84 y.o. Female) Treating RN: Curtis Sites Primary Care Provider: Barbette Reichmann Other Clinician: Referring Provider: Barbette Reichmann Treating Provider/Extender: Linwood Dibbles, Kemora Pinard Weeks in Treatment: 5 Information Obtained From Patient Wound History Do you currently have one or more open woundso Yes How many open wounds do you currently haveo 2 Approximately how long have you had your woundso 5 months How have you been treating your wound(s) until nowo camphil Has your wound(s) ever healed and then re-openedo No Have you had any lab work done in the past montho No Have you tested positive for an antibiotic resistant organism (MRSA, VRE)o No Have you tested positive for osteomyelitis (bone infection)o No Have you had any tests for circulation on your legso No Have you had other problems associated with your woundso Swelling Constitutional Symptoms (General Health) Complaints and Symptoms: Negative for: Fever; Chills Cardiovascular Complaints and Symptoms: Positive  for: LE edema Medical History: Positive for: Arrhythmia - a fib; Congestive Heart Failure; Hypertension Negative for: Angina; Coronary Artery Disease; Deep Vein Thrombosis; Hypotension; Myocardial Infarction; Peripheral Arterial Disease; Peripheral Venous Disease; Phlebitis; Vasculitis Eyes Medical History: Positive for: Cataracts - removed Past Medical History Notes: macular degeneration Hematologic/Lymphatic Medical History: Positive for: Anemia Negative for: Hemophilia; Human Immunodeficiency Virus; Lymphedema; Sickle Cell Disease Respiratory Complaints and Symptoms: No Complaints or Symptoms Medical History: Negative for: Aspiration; Asthma; Chronic Obstructive Pulmonary Disease (COPD); Pneumothorax; Sleep Apnea; [name] [name], [name] [name] ([id number]) Tuberculosis Gastrointestinal Medical History: Negative for: Cirrhosis ; Colitis; Crohnos; Hepatitis A; Hepatitis B; Hepatitis C Endocrine Medical History: Positive for: Type II Diabetes Treated with: Insulin Immunological Medical  History: Negative for: Lupus Erythematosus; Raynaudos; Scleroderma Musculoskeletal Medical History: Negative for: Gout; Rheumatoid Arthritis; Osteoarthritis; Osteomyelitis Neurologic Medical History: Negative for: Dementia; Neuropathy Oncologic Medical History: Negative for: Received Chemotherapy; Received Radiation Psychiatric Complaints and Symptoms: No Complaints or Symptoms HBO Extended History Items Eyes: Cataracts Immunizations Pneumococcal Vaccine: Received Pneumococcal Vaccination: Yes Immunization Notes: up to date Implantable Devices Family and Social History Cancer: Yes - Child; Diabetes: No; Heart Disease: Yes - Mother; Hereditary Spherocytosis: No; Hypertension: No; Kidney Disease: No; Lung Disease: No; Seizures: No; Stroke: No; Thyroid Problems: No; Tuberculosis: No; Former smoker - quit 60 years ago; Marital Status - Widowed; Alcohol Use: Never; Drug Use: No History; Caffeine Use: Daily; Financial Concerns: No; Food, Clothing or Shelter Needs: No; Support System Lacking: No; Transportation Concerns: No; Advanced Directives: No; Patient does not want information on Advanced Directives Physician [name] [name], [name] [name] ([id number]) I have reviewed and agree with the above information. Electronic Signature(s) Signed: 06/27/2017 5:08:30 PM By: Curtis Sites Signed: 06/27/2017 5:30:29 PM By: Lenda Kelp PA-C Entered By: Lenda Kelp on 06/27/2017 11:23:13 [name] [name] ([id number]) -------------------------------------------------------------------------------- SuperBill Details Patient Name: [name] [name] Date of Service: 06/27/2017 Medical Record Number: [id number] Patient Account Number: [id number] Date of Birth/Sex: 11/20/1925 (84 y.o. Female) Treating RN: Curtis Sites Primary Care Provider: Barbette Reichmann Other Clinician: Referring Provider: Barbette Reichmann Treating Provider/Extender: Linwood Dibbles, Doraine Schexnider Weeks in Treatment: 5 Diagnosis  Coding ICD-10 Codes Code Description E11.622 Type 2 diabetes mellitus with other skin ulcer I89.0 Lymphedema, not elsewhere classified L97.222 Non-pressure chronic ulcer of left calf with fat layer exposed I50.22 Chronic systolic (congestive) heart failure N18.3 Chronic kidney disease, stage 3 (moderate) Facility Procedures CPT4 Code: [id number] Description: 99213 - WOUND CARE VISIT-LEV 3 EST PT Modifier: Quantity: 1 Physician Procedures CPT4 Code: 3244010 Description: 99213 - WC PHYS LEVEL 3 - EST PT ICD-10 Diagnosis Description E11.622 Type 2 diabetes mellitus with other skin ulcer I89.0 Lymphedema, not elsewhere classified 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

## 2017-06-28 NOTE — Progress Notes (Signed)
[name] [name], [name] [name] ([id number]) Visit Report for 06/27/2017 Arrival Information Details Patient Name: [name] [name] Date of Service: 06/27/2017 11:00 AM Medical Record Number: [id number] Patient Account Number: [id number] Date of Birth/Sex: [birth year] (84 y.o. Female) Treating RN: Curtis Sites Primary Care Makinsley Schiavi: Barbette Reichmann Other Clinician: Referring Janean Eischen: Barbette Reichmann Treating Kalani Baray/Extender: Linwood Dibbles, HOYT Weeks in Treatment: 5 Visit Information History Since Last Visit Added or deleted any medications: No Patient Arrived: Cane Any new allergies or adverse reactions: No Arrival Time: 10:59 Had a fall or experienced change in No Accompanied By: dtr activities of daily living that may affect Transfer Assistance: None risk of falls: Patient Identification Verified: Yes Signs or symptoms of abuse/neglect since last visito No Secondary Verification Process Yes Hospitalized since last visit: No Completed: Has Dressing in Place as Prescribed: Yes Patient Requires Transmission-Based No Has Compression in Place as Prescribed: Yes Precautions: Pain Present Now: No Patient Has Alerts: Yes Patient Alerts: Patient on Blood Thinner DMII warfarin Electronic Signature([name] [name]) Signed: 06/27/2017 5:08:30 PM By: Curtis Sites Entered By: Curtis Sites on 06/27/2017 11:00:48 [name] [name] ([id number]) -------------------------------------------------------------------------------- Clinic Level of Care Assessment Details Patient Name: [name] [name] Date of Service: 06/27/2017 11:00 AM Medical Record Number: [id number] Patient Account Number: [id number] Date of Birth/Sex: [birth year] (84 y.o. Female) Treating RN: Curtis Sites Primary Care Graves Nipp: Barbette Reichmann Other Clinician: Referring Cicley Ganesh: Barbette Reichmann Treating Kalman Nylen/Extender: Linwood Dibbles, HOYT Weeks in Treatment: 5 Clinic Level of Care Assessment Items TOOL 4 Quantity Score []  - Use when only an  EandM is performed on FOLLOW-UP visit 0 ASSESSMENTS - Nursing Assessment / Reassessment X - Reassessment of Co-morbidities (includes updates in patient status) 1 10 X- 1 5 Reassessment of Adherence to Treatment Plan ASSESSMENTS - Wound and Skin Assessment / Reassessment X - Simple Wound Assessment / Reassessment - one wound 1 5 []  - 0 Complex Wound Assessment / Reassessment - multiple wounds []  - 0 Dermatologic / Skin Assessment (not related to wound area) ASSESSMENTS - Focused Assessment X - Circumferential Edema Measurements - multi extremities 1 5 []  - 0 Nutritional Assessment / Counseling / Intervention X- 1 5 Lower Extremity Assessment (monofilament, tuning fork, pulses) []  - 0 Peripheral Arterial Disease Assessment (using hand held doppler) ASSESSMENTS - Ostomy and/or Continence Assessment and Care []  - Incontinence Assessment and Management 0 []  - 0 Ostomy Care Assessment and Management (repouching, etc.) PROCESS - Coordination of Care X - Simple Patient / Family Education for ongoing care 1 15 []  - 0 Complex (extensive) Patient / Family Education for ongoing care []  - 0 Staff obtains Chiropractor, Records, Test Results / Process Orders []  - 0 Staff telephones HHA, Nursing Homes / Clarify orders / etc []  - 0 Routine Transfer to another Facility (non-emergent condition) []  - 0 Routine Hospital Admission (non-emergent condition) []  - 0 New Admissions / Manufacturing engineer / Ordering NPWT, Apligraf, etc. []  - 0 Emergency Hospital Admission (emergent condition) X- 1 10 Simple Discharge Coordination [name] [name], [name] [name] ([id number]) []  - 0 Complex (extensive) Discharge Coordination PROCESS - Special Needs []  - Pediatric / Minor Patient Management 0 []  - 0 Isolation Patient Management []  - 0 Hearing / Language / Visual special needs []  - 0 Assessment of Community assistance (transportation, D/C planning, etc.) []  - 0 Additional assistance / Altered mentation []  -  0 Support Surface([name] [name]) Assessment (bed, cushion, seat, etc.) INTERVENTIONS - Wound Cleansing / Measurement X - Simple Wound Cleansing - one wound 1 5 []  - 0 Complex Wound Cleansing - multiple wounds X- 1 5 Wound  Imaging (photographs - any number of wounds) []  - 0 Wound Tracing (instead of photographs) X- 1 5 Simple Wound Measurement - one wound []  - 0 Complex Wound Measurement - multiple wounds INTERVENTIONS - Wound Dressings []  - Small Wound Dressing one or multiple wounds 0 []  - 0 Medium Wound Dressing one or multiple wounds X- 1 20 Large Wound Dressing one or multiple wounds []  - 0 Application of Medications - topical []  - 0 Application of Medications - injection INTERVENTIONS - Miscellaneous []  - External ear exam 0 []  - 0 Specimen Collection (cultures, biopsies, blood, body fluids, etc.) []  - 0 Specimen([name] [name]) / Culture([name] [name]) sent or taken to Lab for analysis []  - 0 Patient Transfer (multiple staff / Nurse, adult / Similar devices) []  - 0 Simple Staple / Suture removal (25 or less) []  - 0 Complex Staple / Suture removal (26 or more) []  - 0 Hypo / Hyperglycemic Management (close monitor of Blood Glucose) []  - 0 Ankle / Brachial Index (ABI) - do not check if billed separately X- 1 5 Vital Signs [name] [name], [name] [name] ([id number]) Has the patient been seen at the hospital within the last three years: Yes Total Score: 95 Level Of Care: New/Established - Level 3 Electronic Signature([name] [name]) Signed: 06/27/2017 5:08:30 PM By: Curtis Sites Entered By: Curtis Sites on 06/27/2017 12:34:30 [name] [name] ([id number]) -------------------------------------------------------------------------------- Encounter Discharge Information Details Patient Name: [name] [name] Date of Service: 06/27/2017 11:00 AM Medical Record Number: [id number] Patient Account Number: [id number] Date of Birth/Sex: 1926-04-17 (84 y.o. Female) Treating RN: Curtis Sites Primary Care Cainen Burnham: Barbette Reichmann Other  Clinician: Referring Madyson Lukach: Barbette Reichmann Treating Eular Panek/Extender: Linwood Dibbles, HOYT Weeks in Treatment: 5 Encounter Discharge Information Items Discharge Pain Level: 0 Discharge Condition: Stable Ambulatory Status: Cane Discharge Destination: Home Transportation: Private Auto Accompanied By: dtr Schedule Follow-up Appointment: Yes Medication Reconciliation completed and No provided to Patient/Care Shatira Dobosz: Provided on Clinical Summary of Care: 06/27/2017 Form Type Recipient Paper Patient MS Electronic Signature([name] [name]) Signed: 06/27/2017 12:35:48 PM By: Curtis Sites Entered By: Curtis Sites on 06/27/2017 12:35:48 [name] [name] ([id number]) -------------------------------------------------------------------------------- Lower Extremity Assessment Details Patient Name: [name] [name] Date of Service: 06/27/2017 11:00 AM Medical Record Number: [id number] Patient Account Number: [id number] Date of Birth/Sex: 25-Nov-1925 (84 y.o. Female) Treating RN: Curtis Sites Primary Care Karsten Vaughn: Barbette Reichmann Other Clinician: Referring Tuwana Kapaun: Barbette Reichmann Treating Batoul Limes/Extender: Linwood Dibbles, HOYT Weeks in Treatment: 5 Edema Assessment Assessed: [Left: No] [Right: No] [Left: Edema] [Right: :] Calf Left: Right: Point of Measurement: 32 cm From Medial Instep 44.9 cm cm Ankle Left: Right: Point of Measurement: 9 cm From Medial Instep 28.7 cm cm Vascular Assessment Pulses: Dorsalis Pedis Palpable: [Left:Yes] Posterior Tibial Palpable: [Left:Yes] Extremity colors, hair growth, and conditions: Extremity Color: [Left:Hyperpigmented] Hair Growth on Extremity: [Left:No] Temperature of Extremity: [Left:Warm] Capillary Refill: [Left:< 3 seconds] Electronic Signature([name] [name]) Signed: 06/27/2017 5:08:30 PM By: Curtis Sites Entered By: Curtis Sites on 06/27/2017 11:06:32 [name] [name]  ([id number]) -------------------------------------------------------------------------------- Multi Wound Chart Details Patient Name: [name] [name] Date of Service: 06/27/2017 11:00 AM Medical Record Number: [id number] Patient Account Number: [id number] Date of Birth/Sex: 06-12-1926 (84 y.o. Female) Treating RN: Curtis Sites Primary Care Nickalos Petersen: Barbette Reichmann Other Clinician: Referring Niketa Turner: Barbette Reichmann Treating Nelia Rogoff/Extender: Linwood Dibbles, HOYT Weeks in Treatment: 5 Vital Signs Height(in): 63 Pulse(bpm): 66 Weight(lbs): 212 Blood Pressure(mmHg): 98/73 Body Mass Index(BMI): 38 Temperature(F): 98.1 Respiratory Rate 20 (breaths/min): Photos: [1:No Photos] [N/A:N/A] Wound Location: [1:Left Lower Leg - Posterior] [N/A:N/A] Wounding Event: [1:Blister] [N/A:N/A] Primary Etiology: [1:Diabetic Wound/Ulcer of the Lower Extremity] [N/A:N/A] Secondary  Etiology: [1:Lymphedema] [N/A:N/A] Comorbid History: [1:Cataracts, Anemia, Arrhythmia, Congestive Heart Failure, Hypertension, Type II Diabetes] [N/A:N/A] Date Acquired: [1:12/28/2016] [N/A:N/A] Weeks of Treatment: [1:5] [N/A:N/A] Wound Status: [1:Open] [N/A:N/A] Measurements L x W x D [1:0.2x0.2x0.1] [N/A:N/A] (cm) Area (cm) : [1:0.031] [N/A:N/A] Volume (cm) : [1:0.003] [N/A:N/A] % Reduction in Area: [1:86.90%] [N/A:N/A] % Reduction in Volume: [1:87.50%] [N/A:N/A] Classification: [1:Grade 1] [N/A:N/A] Exudate Amount: [1:Large] [N/A:N/A] Exudate Type: [1:Serous] [N/A:N/A] Exudate Color: [1:amber] [N/A:N/A] Wound Margin: [1:Flat and Intact] [N/A:N/A] Granulation Amount: [1:Large (67-100%)] [N/A:N/A] Granulation Quality: [1:Pink] [N/A:N/A] Necrotic Amount: [1:None Present (0%)] [N/A:N/A] Exposed Structures: [1:Fascia: No Fat Layer (Subcutaneous Tissue) Exposed: No Tendon: No Muscle: No Joint: No Bone: No] [N/A:N/A] Epithelialization: [1:Large (67-100%)] [N/A:N/A] Periwound Skin Texture: [1:Excoriation: No  Induration: No] [N/A:N/A] Callus: No Crepitus: No Rash: No Scarring: No Periwound Skin Moisture: Maceration: No N/A N/A Dry/Scaly: No Periwound Skin Color: Atrophie Blanche: No N/A N/A Cyanosis: No Ecchymosis: No Erythema: No Hemosiderin Staining: No Mottled: No Pallor: No Rubor: No Temperature: No Abnormality N/A N/A Tenderness on Palpation: Yes N/A N/A Wound Preparation: Ulcer Cleansing: N/A N/A Rinsed/Irrigated with Saline Topical Anesthetic Applied: Other: lidocaine 4% Treatment Notes Electronic Signature([name] [name]) Signed: 06/27/2017 5:08:30 PM By: Curtis Sitesorthy, Joanna Entered By: Curtis Sitesorthy, Joanna on 06/27/2017 11:06:50 [name] [name], [name] [name] ([id number]) -------------------------------------------------------------------------------- Multi-Disciplinary Care Plan Details Patient Name: [name] [name], [name] [name] Date of Service: 06/27/2017 11:00 AM Medical Record Number: [id number] Patient Account Number: [id number] Date of Birth/Sex: [birth year] (84 y.o. Female) Treating RN: Curtis Sitesorthy, Joanna Primary Care Tashanda Fuhrer: Barbette ReichmannHande, Vishwanath Other Clinician: Referring Mlissa Tamayo: Barbette ReichmannHande, Vishwanath Treating Davin Archuletta/Extender: Linwood DibblesSTONE III, HOYT Weeks in Treatment: 5 Active Inactive ` Abuse / Safety / Falls / Self Care Management Nursing Diagnoses: Impaired physical mobility Goals: Patient will not experience any injury related to falls Date Initiated: 05/19/2017 Target Resolution Date: 07/30/2017 Goal Status: Active Interventions: Assess fall risk on admission and as needed Notes: ` Orientation to the Wound Care Program Nursing Diagnoses: Knowledge deficit related to the wound healing center program Goals: Patient/caregiver will verbalize understanding of the Wound Healing Center Program Date Initiated: 05/19/2017 Target Resolution Date: 07/30/2017 Goal Status: Active Interventions: Provide education on orientation to the wound center Notes: ` Wound/Skin Impairment Nursing Diagnoses: Impaired tissue  integrity Goals: Ulcer/skin breakdown will heal within 14 weeks Date Initiated: 05/19/2017 Target Resolution Date: 07/30/2017 Goal Status: Active Interventions: [name] [name], [name] [name] ([id number]) Assess patient/caregiver ability to obtain necessary supplies Assess patient/caregiver ability to perform ulcer/skin care regimen upon admission and as needed Assess ulceration([name] [name]) every visit Notes: Electronic Signature([name] [name]) Signed: 06/27/2017 5:08:30 PM By: Curtis Sitesorthy, Joanna Entered By: Curtis Sitesorthy, Joanna on 06/27/2017 11:06:37 [name] [name], [name] [name] ([id number]) -------------------------------------------------------------------------------- Pain Assessment Details Patient Name: [name] [name], [name] [name] Date of Service: 06/27/2017 11:00 AM Medical Record Number: [id number] Patient Account Number: [id number] Date of Birth/Sex: [birth year] (84 y.o. Female) Treating RN: Curtis Sitesorthy, Joanna Primary Care Damaria Stofko: Barbette ReichmannHande, Vishwanath Other Clinician: Referring Rodriquez Thorner: Barbette ReichmannHande, Vishwanath Treating Acire Tang/Extender: Linwood DibblesSTONE III, HOYT Weeks in Treatment: 5 Active Problems Location of Pain Severity and Description of Pain Patient Has Paino No Site Locations Pain Management and Medication Current Pain Management: Electronic Signature([name] [name]) Signed: 06/27/2017 5:08:30 PM By: Curtis Sitesorthy, Joanna Entered By: Curtis Sitesorthy, Joanna on 06/27/2017 11:01:32 [name] [name], [name] [name] ([id number]) -------------------------------------------------------------------------------- Patient/Caregiver Education Details Patient Name: [name] [name], Dejon Date of Service: 06/27/2017 11:00 AM Medical Record Number: [id number] Patient Account Number: [id number] Date of Birth/Gender: [birth year] (84 y.o. Female) Treating RN: Curtis Sitesorthy, Joanna Primary Care Physician: Barbette ReichmannHande, Vishwanath Other Clinician: Referring Physician: Barbette ReichmannHande, Vishwanath Treating Physician/Extender: Skeet SimmerSTONE III, HOYT Weeks in Treatment: 5 Education Assessment Education Provided To: Caregiver Education Topics  Provided Wound/Skin  Impairment: Handouts: Other: wound care as ordered Methods: Demonstration, Explain/Verbal Responses: State content correctly Electronic Signature([name] [name]) Signed: 06/27/2017 5:08:30 PM By: Curtis Sites Entered By: Curtis Sites on 06/27/2017 12:36:08 [name] [name] ([id number]) -------------------------------------------------------------------------------- Wound Assessment Details Patient Name: [name] [name] Date of Service: 06/27/2017 11:00 AM Medical Record Number: [id number] Patient Account Number: [id number] Date of Birth/Sex: 12/16/1925 (84 y.o. Female) Treating RN: Curtis Sites Primary Care Shareef Eddinger: Barbette Reichmann Other Clinician: Referring Rolfe Hartsell: Barbette Reichmann Treating Cyara Devoto/Extender: Linwood Dibbles, HOYT Weeks in Treatment: 5 Wound Status Wound Number: 1 Primary Diabetic Wound/Ulcer of the Lower Extremity Etiology: Wound Location: Left Lower Leg - Posterior Secondary Lymphedema Wounding Event: Blister Etiology: Date Acquired: 12/28/2016 Wound Status: Open Weeks Of Treatment: 5 Comorbid Cataracts, Anemia, Arrhythmia, Congestive Clustered Wound: No History: Heart Failure, Hypertension, Type II Diabetes Photos Photo Uploaded By: Curtis Sites on 06/27/2017 11:08:20 Wound Measurements Length: (cm) 0.2 Width: (cm) 0.2 Depth: (cm) 0.1 Area: (cm) 0.031 Volume: (cm) 0.003 % Reduction in Area: 86.9% % Reduction in Volume: 87.5% Epithelialization: Large (67-100%) Tunneling: No Undermining: No Wound Description Classification: Grade 1 Wound Margin: Flat and Intact Exudate Amount: Large Exudate Type: Serous Exudate Color: amber Foul Odor After Cleansing: No Slough/Fibrino Yes Wound Bed Granulation Amount: Large (67-100%) Exposed Structure Granulation Quality: Pink Fascia Exposed: No Necrotic Amount: None Present (0%) Fat Layer (Subcutaneous Tissue) Exposed: No Tendon Exposed: No Muscle Exposed: No Joint Exposed: No Bone  Exposed: No Periwound Skin Texture [name] [name], Sadaf ([id number]) Texture Color No Abnormalities Noted: No No Abnormalities Noted: No Callus: No Atrophie Blanche: No Crepitus: No Cyanosis: No Excoriation: No Ecchymosis: No Induration: No Erythema: No Rash: No Hemosiderin Staining: No Scarring: No Mottled: No Pallor: No Moisture Rubor: No No Abnormalities Noted: No Dry / Scaly: No Temperature / Pain Maceration: No Temperature: No Abnormality Tenderness on Palpation: Yes Wound Preparation Ulcer Cleansing: Rinsed/Irrigated with Saline Topical Anesthetic Applied: Other: lidocaine 4%, Treatment Notes Wound #1 (Left, Posterior Lower Leg) 1. Cleansed with: Clean wound with Normal Saline 4. Dressing Applied: Prisma Ag 5. Secondary Dressing Applied ABD Pad 7. Secured with Tape Other (specify in notes) Notes kerlix and coban wrap, unna to anchor Electronic Signature([name] [name]) Signed: 06/27/2017 5:08:30 PM By: Curtis Sites Entered By: Curtis Sites on 06/27/2017 11:05:09 [name] [name] ([id number]) -------------------------------------------------------------------------------- Vitals Details Patient Name: [name] [name] Date of Service: 06/27/2017 11:00 AM Medical Record Number: [id number] Patient Account Number: [id number] Date of Birth/Sex: July 21, 1925 (84 y.o. Female) Treating RN: Curtis Sites Primary Care Churchill Grimsley: Barbette Reichmann Other Clinician: Referring Kissie Ziolkowski: Barbette Reichmann Treating Antoine Fiallos/Extender: Linwood Dibbles, HOYT Weeks in Treatment: 5 Vital Signs Time Taken: 11:01 Temperature (F): 98.1 Height (in): 63 Pulse (bpm): 66 Weight (lbs): 212 Respiratory Rate (breaths/min): 20 Body Mass Index (BMI): 37.6 Blood Pressure (mmHg): 98/73 Reference Range: 80 - 120 mg / dl Electronic Signature([name] [name]) Signed: 06/27/2017 5:08:30 PM By: Curtis Sites Entered By: Curtis Sites on 06/27/2017 11:03:09

## 2017-06-30 NOTE — Telephone Encounter (Signed)
Faxed signed prescription as requested to Lynnae PrudeMarie Morroni, [facility],  [phone]

## 2017-07-04 ENCOUNTER — Encounter: Payer: Medicare Other | Admitting: Physician Assistant

## 2017-07-04 DIAGNOSIS — E11622 Type 2 diabetes mellitus with other skin ulcer: Secondary | ICD-10-CM | POA: Diagnosis not present

## 2017-07-05 NOTE — Progress Notes (Signed)
NIVA, MURREN ([id number]) Visit Report for 07/04/2017 Arrival Information Details Patient Name: [name] [name], [name] [name] Date of Service: 07/04/2017 3:15 PM Medical Record Number: [id number] Patient Account Number: [id number] Date of Birth/Sex: May 12, 1926 (84 y.o. Female) Treating RN: Curtis Sites Primary Care Ric Rosenberg: Barbette Reichmann Other Clinician: Referring Murtaza Shell: Barbette Reichmann Treating Yordy Matton/Extender: Linwood Dibbles, HOYT Weeks in Treatment: 6 Visit Information History Since Last Visit Added or deleted any medications: No Patient Arrived: Cane Any new allergies or adverse reactions: No Arrival Time: 15:18 Had a fall or experienced change in No Accompanied By: dtr activities of daily living that may affect Transfer Assistance: None risk of falls: Patient Identification Verified: Yes Signs or symptoms of abuse/neglect since last visito No Secondary Verification Process Yes Hospitalized since last visit: No Completed: Has Dressing in Place as Prescribed: Yes Patient Requires Transmission-Based No Has Compression in Place as Prescribed: Yes Precautions: Pain Present Now: No Patient Has Alerts: Yes Patient Alerts: Patient on Blood Thinner DMII warfarin Electronic Signature(s) Signed: 07/04/2017 4:52:46 PM By: Curtis Sites Entered By: Curtis Sites on 07/04/2017 15:19:00 [name] ([id number]) -------------------------------------------------------------------------------- Clinic Level of Care Assessment Details Patient Name: [name] Date of Service: 07/04/2017 3:15 PM Medical Record Number: [id number] Patient Account Number: [id number] Date of Birth/Sex: 17-Apr-1926 (84 y.o. Female) Treating RN: Curtis Sites Primary Care Calysta Craigo: Barbette Reichmann Other Clinician: Referring Karenann Mcgrory: Barbette Reichmann Treating Ethleen Lormand/Extender: Linwood Dibbles, HOYT Weeks in Treatment: 6 Clinic Level of Care Assessment Items TOOL 4 Quantity Score []  - Use when only an  EandM is performed on FOLLOW-UP visit 0 ASSESSMENTS - Nursing Assessment / Reassessment X - Reassessment of Co-morbidities (includes updates in patient status) 1 10 X- 1 5 Reassessment of Adherence to Treatment Plan ASSESSMENTS - Wound and Skin Assessment / Reassessment X - Simple Wound Assessment / Reassessment - one wound 1 5 []  - 0 Complex Wound Assessment / Reassessment - multiple wounds []  - 0 Dermatologic / Skin Assessment (not related to wound area) ASSESSMENTS - Focused Assessment X - Circumferential Edema Measurements - multi extremities 1 5 []  - 0 Nutritional Assessment / Counseling / Intervention X- 1 5 Lower Extremity Assessment (monofilament, tuning fork, pulses) []  - 0 Peripheral Arterial Disease Assessment (using hand held doppler) ASSESSMENTS - Ostomy and/or Continence Assessment and Care []  - Incontinence Assessment and Management 0 []  - 0 Ostomy Care Assessment and Management (repouching, etc.) PROCESS - Coordination of Care X - Simple Patient / Family Education for ongoing care 1 15 []  - 0 Complex (extensive) Patient / Family Education for ongoing care []  - 0 Staff obtains Chiropractor, Records, Test Results / Process Orders []  - 0 Staff telephones HHA, Nursing Homes / Clarify orders / etc []  - 0 Routine Transfer to another Facility (non-emergent condition) []  - 0 Routine Hospital Admission (non-emergent condition) []  - 0 New Admissions / Manufacturing engineer / Ordering NPWT, Apligraf, etc. []  - 0 Emergency Hospital Admission (emergent condition) X- 1 10 Simple Discharge Coordination Grinage, Undrea ([id number]) []  - 0 Complex (extensive) Discharge Coordination PROCESS - Special Needs []  - Pediatric / Minor Patient Management 0 []  - 0 Isolation Patient Management []  - 0 Hearing / Language / Visual special needs []  - 0 Assessment of Community assistance (transportation, D/C planning, etc.) []  - 0 Additional assistance / Altered mentation []  -  0 Support Surface(s) Assessment (bed, cushion, seat, etc.) INTERVENTIONS - Wound Cleansing / Measurement X - Simple Wound Cleansing - one wound 1 5 []  - 0 Complex Wound Cleansing - multiple wounds X- 1 5 Wound  Imaging (photographs - any number of wounds) []  - 0 Wound Tracing (instead of photographs) X- 1 5 Simple Wound Measurement - one wound []  - 0 Complex Wound Measurement - multiple wounds INTERVENTIONS - Wound Dressings X - Small Wound Dressing one or multiple wounds 1 10 []  - 0 Medium Wound Dressing one or multiple wounds []  - 0 Large Wound Dressing one or multiple wounds []  - 0 Application of Medications - topical []  - 0 Application of Medications - injection INTERVENTIONS - Miscellaneous []  - External ear exam 0 []  - 0 Specimen Collection (cultures, biopsies, blood, body fluids, etc.) []  - 0 Specimen(s) / Culture(s) sent or taken to Lab for analysis []  - 0 Patient Transfer (multiple staff / Nurse, adultHoyer Lift / Similar devices) []  - 0 Simple Staple / Suture removal (25 or less) []  - 0 Complex Staple / Suture removal (26 or more) []  - 0 Hypo / Hyperglycemic Management (close monitor of Blood Glucose) []  - 0 Ankle / Brachial Index (ABI) - do not check if billed separately X- 1 5 Vital Signs Torain, Dannae ([id number]) Has the patient been seen at the hospital within the last three years: Yes Total Score: 85 Level Of Care: New/Established - Level 3 Electronic Signature(s) Signed: 07/04/2017 4:52:46 PM By: Curtis Sitesorthy, Joanna Entered By: Curtis Sitesorthy, Joanna on 07/04/2017 16:47:11 Finis BudSIEGEL, Sirinity ([id number]) -------------------------------------------------------------------------------- Encounter Discharge Information Details Patient Name: Finis BudSIEGEL, Kalis Date of Service: 07/04/2017 3:15 PM Medical Record Number: [id number] Patient Account Number: [id number] Date of Birth/Sex: 1925/10/01 (84 y.o. Female) Treating RN: Curtis Sitesorthy, Joanna Primary Care Sulayman Manning: Barbette ReichmannHande, Vishwanath  Other Clinician: Referring Brittnae Aschenbrenner: Barbette ReichmannHande, Vishwanath Treating Nikolas Casher/Extender: Linwood DibblesSTONE III, HOYT Weeks in Treatment: 6 Encounter Discharge Information Items Discharge Pain Level: 0 Discharge Condition: Stable Ambulatory Status: Cane Discharge Destination: Home Transportation: Private Auto Accompanied By: dtr Schedule Follow-up Appointment: Yes Medication Reconciliation completed and No provided to Patient/Care Jodell Weitman: Provided on Clinical Summary of Care: 07/04/2017 Form Type Recipient Paper Patient MS Electronic Signature(s) Signed: 07/04/2017 4:48:16 PM By: Curtis Sitesorthy, Joanna Entered By: Curtis Sitesorthy, Joanna on 07/04/2017 16:48:16 Finis BudSIEGEL, Ashlye ([id number]) -------------------------------------------------------------------------------- Lower Extremity Assessment Details Patient Name: Finis BudSIEGEL, Ivelisse Date of Service: 07/04/2017 3:15 PM Medical Record Number: [id number] Patient Account Number: [id number] Date of Birth/Sex: 1925/10/01 (84 y.o. Female) Treating RN: Curtis Sitesorthy, Joanna Primary Care Aylene Acoff: Barbette ReichmannHande, Vishwanath Other Clinician: Referring Jonai Weyland: Barbette ReichmannHande, Vishwanath Treating Rosealie Reach/Extender: Linwood DibblesSTONE III, HOYT Weeks in Treatment: 6 Edema Assessment Assessed: [Left: No] [Right: No] [Left: Edema] [Right: :] Calf Left: Right: Point of Measurement: 32 cm From Medial Instep 45.1 cm cm Ankle Left: Right: Point of Measurement: 9 cm From Medial Instep 28.5 cm cm Vascular Assessment Pulses: Dorsalis Pedis Palpable: [Left:Yes] Posterior Tibial Extremity colors, hair growth, and conditions: Extremity Color: [Left:Hyperpigmented] Hair Growth on Extremity: [Left:No] Temperature of Extremity: [Left:Warm] Capillary Refill: [Left:< 3 seconds] Toe Nail Assessment Left: Right: Thick: Yes Discolored: Yes Deformed: Yes Improper Length and Hygiene: Yes Electronic Signature(s) Signed: 07/04/2017 4:52:46 PM By: Curtis Sitesorthy, Joanna Entered By: Curtis Sitesorthy, Joanna on 07/04/2017  15:37:17 Finis BudSIEGEL, Consandra ([id number]) -------------------------------------------------------------------------------- Multi Wound Chart Details Patient Name: Finis BudSIEGEL, Timiya Date of Service: 07/04/2017 3:15 PM Medical Record Number: [id number] Patient Account Number: [id number] Date of Birth/Sex: 1925/10/01 (84 y.o. Female) Treating RN: Curtis Sitesorthy, Joanna Primary Care Arvis Zwahlen: Barbette ReichmannHande, Vishwanath Other Clinician: Referring Aribella Vavra: Barbette ReichmannHande, Vishwanath Treating Eisa Necaise/Extender: Linwood DibblesSTONE III, HOYT Weeks in Treatment: 6 Vital Signs Height(in): 63 Pulse(bpm): 66 Weight(lbs): 212 Blood Pressure(mmHg): 143/62 Body Mass Index(BMI): 38 Temperature(F): 98.5 Respiratory Rate 22 (breaths/min): Photos: [1:No Photos] [N/A:N/A] Wound Location: [1:Left Lower Leg - Posterior] [  N/A:N/A] Wounding Event: [1:Blister] [N/A:N/A] Primary Etiology: [1:Diabetic Wound/Ulcer of the Lower Extremity] [N/A:N/A] Secondary Etiology: [1:Lymphedema] [N/A:N/A] Comorbid History: [1:Cataracts, Anemia, Arrhythmia, Congestive Heart Failure, Hypertension, Type II Diabetes] [N/A:N/A] Date Acquired: [1:12/28/2016] [N/A:N/A] Weeks of Treatment: [1:6] [N/A:N/A] Wound Status: [1:Open] [N/A:N/A] Measurements L x W x D [1:0.2x0.1x0.1] [N/A:N/A] (cm) Area (cm) : [1:0.016] [N/A:N/A] Volume (cm) : [1:0.002] [N/A:N/A] % Reduction in Area: [1:93.20%] [N/A:N/A] % Reduction in Volume: [1:91.70%] [N/A:N/A] Classification: [1:Grade 1] [N/A:N/A] Exudate Amount: [1:Large] [N/A:N/A] Exudate Type: [1:Serous] [N/A:N/A] Exudate Color: [1:amber] [N/A:N/A] Wound Margin: [1:Flat and Intact] [N/A:N/A] Granulation Amount: [1:Large (67-100%)] [N/A:N/A] Granulation Quality: [1:Pink] [N/A:N/A] Necrotic Amount: [1:None Present (0%)] [N/A:N/A] Exposed Structures: [1:Fascia: No Fat Layer (Subcutaneous Tissue) Exposed: No Tendon: No Muscle: No Joint: No Bone: No] [N/A:N/A] Epithelialization: [1:Large (67-100%)] [N/A:N/A] Periwound Skin  Texture: [1:Excoriation: No Induration: No] [N/A:N/A] Callus: No Crepitus: No Rash: No Scarring: No Periwound Skin Moisture: Maceration: No N/A N/A Dry/Scaly: No Periwound Skin Color: Atrophie Blanche: No N/A N/A Cyanosis: No Ecchymosis: No Erythema: No Hemosiderin Staining: No Mottled: No Pallor: No Rubor: No Temperature: No Abnormality N/A N/A Tenderness on Palpation: Yes N/A N/A Wound Preparation: Ulcer Cleansing: N/A N/A Rinsed/Irrigated with Saline Topical Anesthetic Applied: None Treatment Notes Electronic Signature(s) Signed: 07/04/2017 4:52:46 PM By: Curtis Sites Entered By: Curtis Sites on 07/04/2017 15:37:29 [name] ([id number]) -------------------------------------------------------------------------------- Multi-Disciplinary Care Plan Details Patient Name: [name] Date of Service: 07/04/2017 3:15 PM Medical Record Number: [id number] Patient Account Number: [id number] Date of Birth/Sex: [birth year] (84 y.o. Female) Treating RN: Curtis Sites Primary Care Sherill Wegener: Barbette Reichmann Other Clinician: Referring Samel Bruna: Barbette Reichmann Treating Ervin Rothbauer/Extender: Linwood Dibbles, HOYT Weeks in Treatment: 6 Active Inactive ` Abuse / Safety / Falls / Self Care Management Nursing Diagnoses: Impaired physical mobility Goals: Patient will not experience any injury related to falls Date Initiated: 05/19/2017 Target Resolution Date: 07/30/2017 Goal Status: Active Interventions: Assess fall risk on admission and as needed Notes: ` Orientation to the Wound Care Program Nursing Diagnoses: Knowledge deficit related to the wound healing center program Goals: Patient/caregiver will verbalize understanding of the Wound Healing Center Program Date Initiated: 05/19/2017 Target Resolution Date: 07/30/2017 Goal Status: Active Interventions: Provide education on orientation to the wound center Notes: ` Wound/Skin Impairment Nursing  Diagnoses: Impaired tissue integrity Goals: Ulcer/skin breakdown will heal within 14 weeks Date Initiated: 05/19/2017 Target Resolution Date: 07/30/2017 Goal Status: Active Interventions: MARLICIA, SROKA ([id number]) Assess patient/caregiver ability to obtain necessary supplies Assess patient/caregiver ability to perform ulcer/skin care regimen upon admission and as needed Assess ulceration(s) every visit Notes: Electronic Signature(s) Signed: 07/04/2017 4:52:46 PM By: Curtis Sites Entered By: Curtis Sites on 07/04/2017 15:37:22 [name] ([id number]) -------------------------------------------------------------------------------- Pain Assessment Details Patient Name: [name] Date of Service: 07/04/2017 3:15 PM Medical Record Number: [id number] Patient Account Number: [id number] Date of Birth/Sex: 02/08/1926 (84 y.o. Female) Treating RN: Curtis Sites Primary Care Kartel Wolbert: Barbette Reichmann Other Clinician: Referring Kashira Behunin: Barbette Reichmann Treating Shatori Bertucci/Extender: Linwood Dibbles, HOYT Weeks in Treatment: 6 Active Problems Location of Pain Severity and Description of Pain Patient Has Paino No Site Locations Pain Management and Medication Current Pain Management: Electronic Signature(s) Signed: 07/04/2017 4:52:46 PM By: Curtis Sites Entered By: Curtis Sites on 07/04/2017 15:20:33 [name] ([id number]) -------------------------------------------------------------------------------- Patient/Caregiver Education Details Patient Name: [name] Date of Service: 07/04/2017 3:15 PM Medical Record Number: [id number] Patient Account Number: [id number] Date of Birth/Gender: [birth year] (84 y.o. Female) Treating RN: Curtis Sites Primary Care Physician: Barbette Reichmann Other Clinician: Referring Physician: Barbette Reichmann Treating Physician/Extender: STONE III, HOYT Weeks in  Treatment: 6 Education Assessment Education Provided To: Patient and  Caregiver Education Topics Provided Venous: Handouts: Other: wear compression daily Methods: Explain/Verbal Responses: State content correctly Electronic Signature(s) Signed: 07/04/2017 4:52:46 PM By: Curtis Sites Entered By: Curtis Sites on 07/04/2017 16:48:31 [name] ([id number]) -------------------------------------------------------------------------------- Wound Assessment Details Patient Name: [name] Date of Service: 07/04/2017 3:15 PM Medical Record Number: [id number] Patient Account Number: [id number] Date of Birth/Sex: 03-15-1926 (84 y.o. Female) Treating RN: Curtis Sites Primary Care Cruzita Lipa: Barbette Reichmann Other Clinician: Referring Adi Doro: Barbette Reichmann Treating Dravon Nott/Extender: Linwood Dibbles, HOYT Weeks in Treatment: 6 Wound Status Wound Number: 1 Primary Diabetic Wound/Ulcer of the Lower Extremity Etiology: Wound Location: Left Lower Leg - Posterior Secondary Lymphedema Wounding Event: Blister Etiology: Date Acquired: 12/28/2016 Wound Status: Open Weeks Of Treatment: 6 Comorbid Cataracts, Anemia, Arrhythmia, Congestive Clustered Wound: No History: Heart Failure, Hypertension, Type II Diabetes Photos Photo Uploaded By: Curtis Sites on 07/05/2017 10:02:20 Wound Measurements Length: (cm) 0.2 Width: (cm) 0.1 Depth: (cm) 0.1 Area: (cm) 0.016 Volume: (cm) 0.002 % Reduction in Area: 93.2% % Reduction in Volume: 91.7% Epithelialization: Large (67-100%) Tunneling: No Undermining: No Wound Description Classification: Grade 1 Wound Margin: Flat and Intact Exudate Amount: Large Exudate Type: Serous Exudate Color: amber Foul Odor After Cleansing: No Slough/Fibrino Yes Wound Bed Granulation Amount: Large (67-100%) Exposed Structure Granulation Quality: Pink Fascia Exposed: No Necrotic Amount: None Present (0%) Fat Layer (Subcutaneous Tissue) Exposed: No Tendon Exposed: No Muscle Exposed: No Joint Exposed: No Bone  Exposed: No Periwound Skin Texture Finnigan, Keeleigh ([id number]) Texture Color No Abnormalities Noted: No No Abnormalities Noted: No Callus: No Atrophie Blanche: No Crepitus: No Cyanosis: No Excoriation: No Ecchymosis: No Induration: No Erythema: No Rash: No Hemosiderin Staining: No Scarring: No Mottled: No Pallor: No Moisture Rubor: No No Abnormalities Noted: No Dry / Scaly: No Temperature / Pain Maceration: No Temperature: No Abnormality Tenderness on Palpation: Yes Wound Preparation Ulcer Cleansing: Rinsed/Irrigated with Saline Topical Anesthetic Applied: None Treatment Notes Wound #1 (Left, Posterior Lower Leg) 1. Cleansed with: Clean wound with Normal Saline 4. Dressing Applied: Prisma Ag 5. Secondary Dressing Applied Kerlix/Conform Non-Adherent pad 7. Secured with Patient to wear own compression stockings Notes coban lightly to secure Electronic Signature(s) Signed: 07/04/2017 4:52:46 PM By: Curtis Sites Entered By: Curtis Sites on 07/04/2017 15:36:41 [name] ([id number]) -------------------------------------------------------------------------------- Vitals Details Patient Name: [name] Date of Service: 07/04/2017 3:15 PM Medical Record Number: [id number] Patient Account Number: [id number] Date of Birth/Sex: [birth year] (84 y.o. Female) Treating RN: Curtis Sites Primary Care Amberleigh Gerken: Barbette Reichmann Other Clinician: Referring Aeon Kessner: Barbette Reichmann Treating Amea Mcphail/Extender: Linwood Dibbles, HOYT Weeks in Treatment: 6 Vital Signs Time Taken: 15:20 Temperature (F): 98.5 Height (in): 63 Pulse (bpm): 66 Weight (lbs): 212 Respiratory Rate (breaths/min): 22 Body Mass Index (BMI): 37.6 Blood Pressure (mmHg): 143/62 Reference Range: 80 - 120 mg / dl Electronic Signature(s) Signed: 07/04/2017 4:52:46 PM By: Curtis Sites Entered By: Curtis Sites on 07/04/2017 15:22:13

## 2017-07-05 NOTE — Progress Notes (Signed)
MAEVEN, MCDOUGALL ([id number]) Visit Report for 07/04/2017 Chief Complaint Document Details Patient Name: [name] [name], [name] [name] Date of Service: 07/04/2017 3:15 PM Medical Record Number: [id number] Patient Account Number: [id number] Date of Birth/Sex: [birth year] (84 y.o. Female) Treating RN: Curtis Sites Primary Care Provider: Barbette Reichmann Other Clinician: Referring Provider: Barbette Reichmann Treating Provider/Extender: Linwood Dibbles, HOYT Weeks in Treatment: 6 Information Obtained from: Patient Chief Complaint Patient presents to the wound care center for a consult due non healing wound to the left lower extremity with bilateral massive swelling of the legs Electronic Signature(s) Signed: 07/05/2017 10:30:07 AM By: Lenda Kelp PA-C Entered By: Lenda Kelp on 07/04/2017 15:34:07 [name] [name] ([id number]) -------------------------------------------------------------------------------- HPI Details Patient Name: [name] [name] Date of Service: 07/04/2017 3:15 PM Medical Record Number: [id number] Patient Account Number: [id number] Date of Birth/Sex: Oct 30, 1925 (84 y.o. Female) Treating RN: Curtis Sites Primary Care Provider: Barbette Reichmann Other Clinician: Referring Provider: Barbette Reichmann Treating Provider/Extender: Linwood Dibbles, HOYT Weeks in Treatment: 6 History of Present Illness HPI Description: 84 year old patient seen by her PCP and referred to our center for left lower extremity stasis ulceration. She is known to have diabetes mellitus type 2, hypertension,status post cholecystectomy and open heart surgery, and atrial fibrillation and has bilateral lower extremity lymphedema with oozing of fluid. Her most recent hemoglobin A1c was 7.2. after assessment by the PCP she was put on torsemide 20 mg daily, echo was noted to have an ejection fraction of more than 55% with moderate MR, and the patient was maintained on warfarin for atrial fibrillation. the patient has been  living in [place] for about a year but has been traveling a bit and has not had any arterial or venous studies done recently. Her last echo was about a year and a half ago. 05/27/2017 -- she has not had any dressing changes since the last time she was here as a home health did not turn up. Her arterial duplex study is scheduled for next week. 06/03/2017 -- -- had a lower extremity arterial study done and there was arterial wall calcification and the resting ABI was noncompressible bilaterally. The right and left toe brachial indices were abnormal with the left first toe pressure being 76 mmHg and the right toe pressure was 89 mmHg. The right toe brachial index was 0.55 and the left toe brachial index was 0.47. 07/04/17 on evaluation today patient appears to be doing well in regard to her left lower chimney ulcer. She has been tolerating the current treatment plan without any complication. Unfortunately they did get compression hose delivered that were 30-40 mmHg but unfortunately she is unable to wear these. Her daughter was able to find 18 mmHg compression socks at the drugstore which she has been able to tolerate without any complication. This seems like it may be a better option for her. It's not as much compression as I would like but also it's something she's actually able to wear and something is going to be better than nothing. Electronic Signature(s) Signed: 07/05/2017 10:30:07 AM By: Lenda Kelp PA-C Entered By: Lenda Kelp on 07/04/2017 16:00:32 [name] [name], [name] [name] ([id number]) -------------------------------------------------------------------------------- Physical Exam Details Patient Name: [name] [name] Date of Service: 07/04/2017 3:15 PM Medical Record Number: [id number] Patient Account Number: [id number] Date of Birth/Sex: 01-11-1926 (84 y.o. Female) Treating RN: Curtis Sites Primary Care Provider: Barbette Reichmann Other Clinician: Referring Provider: Barbette Reichmann Treating Provider/Extender: STONE III, HOYT Weeks in Treatment: 6 Constitutional Obese and well-hydrated in no acute distress. Respiratory normal breathing without difficulty. clear  to auscultation bilaterally. Cardiovascular regular rate and rhythm with normal S1, S2. Psychiatric this patient is able to make decisions and demonstrates good insight into disease process. Alert and Oriented x 3. pleasant and cooperative. Notes Patient's wound appears to be very clean there is no slough covering the wound bed surface and overall I am pleased with what I'm seeing at this time. There is no evidence of infection. Electronic Signature(s) Signed: 07/05/2017 10:30:07 AM By: Lenda KelpStone III, Hoyt PA-C Entered By: Lenda KelpStone III, Hoyt on 07/04/2017 16:02:06 [name] BudSIEGEL, [name] [name] ([id number]) -------------------------------------------------------------------------------- Physician Orders Details Patient Name: [name] BudSIEGEL, Dione Date of Service: 07/04/2017 3:15 PM Medical Record Number: [id number] Patient Account Number: [id number] Date of Birth/Sex: 05/29/1926 (84 y.o. Female) Treating RN: Curtis Sitesorthy, Joanna Primary Care Provider: Barbette ReichmannHande, Vishwanath Other Clinician: Referring Provider: Barbette ReichmannHande, Vishwanath Treating Provider/Extender: Linwood DibblesSTONE III, HOYT Weeks in Treatment: 6 Verbal / Phone Orders: No Diagnosis Coding ICD-10 Coding Code Description E11.622 Type 2 diabetes mellitus with other skin ulcer I89.0 Lymphedema, not elsewhere classified L97.222 Non-pressure chronic ulcer of left calf with fat layer exposed I50.22 Chronic systolic (congestive) heart failure N18.3 Chronic kidney disease, stage 3 (moderate) Wound Cleansing Wound #1 Left,Posterior Lower Leg o Clean wound with Normal Saline. o May Shower, gently pat wound dry prior to applying new dressing. Anesthetic (add to Medication List) Wound #1 Left,Posterior Lower Leg o Topical Lidocaine 4% cream applied to wound bed prior to debridement  (In Clinic Only). Primary Wound Dressing Wound #1 Left,Posterior Lower Leg o Prisma Ag Secondary Dressing Wound #1 Left,Posterior Lower Leg o Non-adherent pad - secure lightly with coban - DO NOT WRAP COBAN TIGHTLY Dressing Change Frequency Wound #1 Left,Posterior Lower Leg o Change dressing every other day. Follow-up Appointments o Return Appointment in 1 week. Edema Control Wound #1 Left,Posterior Lower Leg o Patient to wear own compression stockings o Elevate legs to the level of the heart and pump ankles as often as possible Additional Orders / Instructions Wound #1 Left,Posterior Lower Leg o Increase protein intake. [place] PillarSIEGEL, Zillah ([id number]) o Other: - Please try to keep blood sugars below 180. Please add over the counter vitamin C, multivitamin and zinc supplements to your diet. Home Health Wound #1 Left,Posterior Lower Leg o Continue Home Health Visits - [facility] o Home Health Nurse may visit PRN to address patientos wound care needs. o FACE TO FACE ENCOUNTER: MEDICARE and MEDICAID PATIENTS: I certify that this patient is under my care and that I had a face-to-face encounter that meets the physician face-to-face encounter requirements with this patient on this date. The encounter with the patient was in whole or in part for the following MEDICAL CONDITION: (primary reason for Home Healthcare) MEDICAL NECESSITY: I certify, that based on my findings, NURSING services are a medically necessary home health service. HOME BOUND STATUS: I certify that my clinical findings support that this patient is homebound (i.e., Due to illness or injury, pt requires aid of supportive devices such as crutches, cane, wheelchairs, walkers, the use of special transportation or the assistance of another person to leave their place of residence. There is a normal inability to leave the home and doing so requires considerable and taxing effort. Other absences are for medical  reasons / religious services and are infrequent or of short duration when for other reasons). o If current dressing causes regression in wound condition, may D/C ordered dressing product/s and apply Normal Saline Moist Dressing daily until next Wound Healing Center / Other MD appointment. Notify Wound Healing Center of regression in wound  condition at [phone]. o Please direct any NON-WOUND related issues/requests for orders to patient's Primary Care Physician Electronic Signature(s) Signed: 07/04/2017 4:52:46 PM By: Curtis Sites Signed: 07/05/2017 10:30:07 AM By: Lenda Kelp PA-C Entered By: Curtis Sites on 07/04/2017 15:40:30 [name] [name] ([id number]) -------------------------------------------------------------------------------- Problem List Details Patient Name: [name] [name] Date of Service: 07/04/2017 3:15 PM Medical Record Number: [id number] Patient Account Number: [id number] Date of Birth/Sex: 1926-01-13 (84 y.o. Female) Treating RN: Curtis Sites Primary Care Provider: Barbette Reichmann Other Clinician: Referring Provider: Barbette Reichmann Treating Provider/Extender: Linwood Dibbles, HOYT Weeks in Treatment: 6 Active Problems ICD-10 Encounter Code Description Active Date Diagnosis E11.622 Type 2 diabetes mellitus with other skin ulcer 05/19/2017 Yes I89.0 Lymphedema, not elsewhere classified 05/19/2017 Yes L97.222 Non-pressure chronic ulcer of left calf with fat layer exposed 05/19/2017 Yes I50.22 Chronic systolic (congestive) heart failure 05/19/2017 Yes N18.3 Chronic kidney disease, stage 3 (moderate) 05/19/2017 Yes Inactive Problems Resolved Problems Electronic Signature(s) Signed: 07/05/2017 10:30:07 AM By: Lenda Kelp PA-C Entered By: Lenda Kelp on 07/04/2017 15:33:53 [name] [name], [name] [name] ([id number]) -------------------------------------------------------------------------------- Progress Note Details Patient Name: [name] [name] Date of  Service: 07/04/2017 3:15 PM Medical Record Number: [id number] Patient Account Number: [id number] Date of Birth/Sex: 1926-03-12 (84 y.o. Female) Treating RN: Curtis Sites Primary Care Provider: Barbette Reichmann Other Clinician: Referring Provider: Barbette Reichmann Treating Provider/Extender: Linwood Dibbles, HOYT Weeks in Treatment: 6 Subjective Chief Complaint Information obtained from Patient Patient presents to the wound care center for a consult due non healing wound to the left lower extremity with bilateral massive swelling of the legs History of Present Illness (HPI) 84 year old patient seen by her PCP and referred to our center for left lower extremity stasis ulceration. She is known to have diabetes mellitus type 2, hypertension,status post cholecystectomy and open heart surgery, and atrial fibrillation and has bilateral lower extremity lymphedema with oozing of fluid. Her most recent hemoglobin A1c was 7.2. after assessment by the PCP she was put on torsemide 20 mg daily, echo was noted to have an ejection fraction of more than 55% with moderate MR, and the patient was maintained on warfarin for atrial fibrillation. the patient has been living in [place] for about a year but has been traveling a bit and has not had any arterial or venous studies done recently. Her last echo was about a year and a half ago. 05/27/2017 -- she has not had any dressing changes since the last time she was here as a home health did not turn up. Her arterial duplex study is scheduled for next week. 06/03/2017 -- -- had a lower extremity arterial study done and there was arterial wall calcification and the resting ABI was noncompressible bilaterally. The right and left toe brachial indices were abnormal with the left first toe pressure being 76 mmHg and the right toe pressure was 89 mmHg. The right toe brachial index was 0.55 and the left toe brachial index was 0.47. 07/04/17 on evaluation today patient  appears to be doing well in regard to her left lower chimney ulcer. She has been tolerating the current treatment plan without any complication. Unfortunately they did get compression hose delivered that were 30-40 mmHg but unfortunately she is unable to wear these. Her daughter was able to find 18 mmHg compression socks at the drugstore which she has been able to tolerate without any complication. This seems like it may be a better option for her. It's not as much compression as I would like but also it's something she's actually able  to wear and something is going to be better than nothing. Patient History Information obtained from Patient. Family History Cancer - Child, Heart Disease - Mother, No family history of Diabetes, Hereditary Spherocytosis, Hypertension, Kidney Disease, Lung Disease, Seizures, Stroke, Thyroid Problems, Tuberculosis. Social History Former smoker - quit 60 years ago, Marital Status - Widowed, Alcohol Use - Never, Drug Use - No History, Caffeine Use - Daily. Medical And Surgical History Notes Eyes macular degeneration FAUN, MCQUEEN ([id number]) Review of Systems (ROS) Constitutional Symptoms (General Health) Denies complaints or symptoms of Fever, Chills. Respiratory The patient has no complaints or symptoms. Cardiovascular Complains or has symptoms of LE edema. Psychiatric The patient has no complaints or symptoms. Objective Constitutional Obese and well-hydrated in no acute distress. Vitals Time Taken: 3:20 PM, Height: 63 in, Weight: 212 lbs, BMI: 37.6, Temperature: 98.5 F, Pulse: 66 bpm, Respiratory Rate: 22 breaths/min, Blood Pressure: 143/62 mmHg. Respiratory normal breathing without difficulty. clear to auscultation bilaterally. Cardiovascular regular rate and rhythm with normal S1, S2. Psychiatric this patient is able to make decisions and demonstrates good insight into disease process. Alert and Oriented x 3. pleasant and  cooperative. General Notes: Patient's wound appears to be very clean there is no slough covering the wound bed surface and overall I am pleased with what I'm seeing at this time. There is no evidence of infection. Integumentary (Hair, Skin) Wound #1 status is Open. Original cause of wound was Blister. The wound is located on the Left,Posterior Lower Leg. The wound measures 0.2cm length x 0.1cm width x 0.1cm depth; 0.016cm^2 area and 0.002cm^3 volume. There is no tunneling or undermining noted. There is a large amount of serous drainage noted. The wound margin is flat and intact. There is large (67-100%) pink granulation within the wound bed. There is no necrotic tissue within the wound bed. The periwound skin appearance did not exhibit: Callus, Crepitus, Excoriation, Induration, Rash, Scarring, Dry/Scaly, Maceration, Atrophie Blanche, Cyanosis, Ecchymosis, Hemosiderin Staining, Mottled, Pallor, Rubor, Erythema. Periwound temperature was noted as No Abnormality. The periwound has tenderness on palpation. Assessment Active Problems [name] [name], [name] [name] ([id number]) ICD-10 E11.622 - Type 2 diabetes mellitus with other skin ulcer I89.0 - Lymphedema, not elsewhere classified L97.222 - Non-pressure chronic ulcer of left calf with fat layer exposed I50.22 - Chronic systolic (congestive) heart failure N18.3 - Chronic kidney disease, stage 3 (moderate) Plan Wound Cleansing: Wound #1 Left,Posterior Lower Leg: Clean wound with Normal Saline. May Shower, gently pat wound dry prior to applying new dressing. Anesthetic (add to Medication List): Wound #1 Left,Posterior Lower Leg: Topical Lidocaine 4% cream applied to wound bed prior to debridement (In Clinic Only). Primary Wound Dressing: Wound #1 Left,Posterior Lower Leg: Prisma Ag Secondary Dressing: Wound #1 Left,Posterior Lower Leg: Non-adherent pad - secure lightly with coban - DO NOT WRAP COBAN TIGHTLY Dressing Change Frequency: Wound #1  Left,Posterior Lower Leg: Change dressing every other day. Follow-up Appointments: Return Appointment in 1 week. Edema Control: Wound #1 Left,Posterior Lower Leg: Patient to wear own compression stockings Elevate legs to the level of the heart and pump ankles as often as possible Additional Orders / Instructions: Wound #1 Left,Posterior Lower Leg: Increase protein intake. Other: - Please try to keep blood sugars below 180. Please add over the counter vitamin C, multivitamin and zinc supplements to your diet. Home Health: Wound #1 Left,Posterior Lower Leg: Continue [facility] Health Visits - [facility] Nurse may visit PRN to address patient s wound care needs. FACE TO FACE ENCOUNTER: MEDICARE and MEDICAID PATIENTS: I  certify that this patient is under my care and that I had a face-to-face encounter that meets the physician face-to-face encounter requirements with this patient on this date. The encounter with the patient was in whole or in part for the following MEDICAL CONDITION: (primary reason for Home Healthcare) MEDICAL NECESSITY: I certify, that based on my findings, NURSING services are a medically necessary home health service. HOME BOUND STATUS: I certify that my clinical findings support that this patient is homebound (i.e., Due to illness or injury, pt requires aid of supportive devices such as crutches, cane, wheelchairs, walkers, the use of special transportation or the assistance of another person to leave their place of residence. There is a normal inability to leave the home and doing so requires considerable and taxing effort. Other absences are for medical reasons / religious services and are infrequent or of short duration when for other reasons). If current dressing causes regression in wound condition, may D/C ordered dressing product/s and apply Normal Saline Moist Dressing daily until next Wound Healing Center / Other MD appointment. Notify Wound Healing Center of  regression in wound condition at [phone]. Please direct any NON-WOUND related issues/requests for orders to patient's Primary Care Physician [name] [name] ([id number]) At this point I'm gonna recommend that we continue with the Current wound care measures and she will proceed with the 18 mmHg compression socks that she can wear. We will see were things stand in one weeks time. I'm glad she's making good progress and hopefully this will continue to completion without any complication. Please see above for specific wound care orders. We will see patient for re-evaluation in 1 week(s) here in the clinic. If anything worsens or changes patient will contact our office for additional recommendations. Electronic Signature(s) Signed: 07/05/2017 10:30:07 AM By: Lenda Kelp PA-C Entered By: Lenda Kelp on 07/04/2017 16:03:33 [name] [name] ([id number]) -------------------------------------------------------------------------------- ROS/PFSH Details Patient Name: [name] [name] Date of Service: 07/04/2017 3:15 PM Medical Record Number: [id number] Patient Account Number: [id number] Date of Birth/Sex: September 24, 1925 (84 y.o. Female) Treating RN: Curtis Sites Primary Care Provider: Barbette Reichmann Other Clinician: Referring Provider: Barbette Reichmann Treating Provider/Extender: Linwood Dibbles, HOYT Weeks in Treatment: 6 Information Obtained From Patient Wound History Do you currently have one or more open woundso Yes How many open wounds do you currently haveo 2 Approximately how long have you had your woundso 5 months How have you been treating your wound(s) until nowo camphil Has your wound(s) ever healed and then re-openedo No Have you had any lab work done in the past montho No Have you tested positive for an antibiotic resistant organism (MRSA, VRE)o No Have you tested positive for osteomyelitis (bone infection)o No Have you had any tests for circulation on your legso No Have you  had other problems associated with your woundso Swelling Constitutional Symptoms (General Health) Complaints and Symptoms: Negative for: Fever; Chills Cardiovascular Complaints and Symptoms: Positive for: LE edema Medical History: Positive for: Arrhythmia - a fib; Congestive Heart Failure; Hypertension Negative for: Angina; Coronary Artery Disease; Deep Vein Thrombosis; Hypotension; Myocardial Infarction; Peripheral Arterial Disease; Peripheral Venous Disease; Phlebitis; Vasculitis Eyes Medical History: Positive for: Cataracts - removed Past Medical History Notes: macular degeneration Hematologic/Lymphatic Medical History: Positive for: Anemia Negative for: Hemophilia; Human Immunodeficiency Virus; Lymphedema; Sickle Cell Disease Respiratory Complaints and Symptoms: No Complaints or Symptoms Medical History: Negative for: Aspiration; Asthma; Chronic Obstructive Pulmonary Disease (COPD); Pneumothorax; Sleep Apnea; [name] [name], [name] [name] ([id number]) Tuberculosis Gastrointestinal Medical History: Negative for: Cirrhosis ; Colitis; Crohnos; Hepatitis  A; Hepatitis B; Hepatitis C Endocrine Medical History: Positive for: Type II Diabetes Treated with: Insulin Immunological Medical History: Negative for: Lupus Erythematosus; Raynaudos; Scleroderma Musculoskeletal Medical History: Negative for: Gout; Rheumatoid Arthritis; Osteoarthritis; Osteomyelitis Neurologic Medical History: Negative for: Dementia; Neuropathy Oncologic Medical History: Negative for: Received Chemotherapy; Received Radiation Psychiatric Complaints and Symptoms: No Complaints or Symptoms HBO Extended History Items Eyes: Cataracts Immunizations Pneumococcal Vaccine: Received Pneumococcal Vaccination: Yes Immunization Notes: up to date Implantable Devices Family and Social History Cancer: Yes - Child; Diabetes: No; Heart Disease: Yes - Mother; Hereditary Spherocytosis: No; Hypertension: No;  Kidney Disease: No; Lung Disease: No; Seizures: No; Stroke: No; Thyroid Problems: No; Tuberculosis: No; Former smoker - quit 60 years ago; Marital Status - Widowed; Alcohol Use: Never; Drug Use: No History; Caffeine Use: Daily; Financial Concerns: No; Food, Clothing or Shelter Needs: No; Support System Lacking: No; Transportation Concerns: No; Advanced Directives: No; Patient does not want information on Advanced Directives Physician [name] [name], [name] [name] ([id number]) I have reviewed and agree with the above information. Electronic Signature(s) Signed: 07/04/2017 4:52:46 PM By: Curtis Sites Signed: 07/05/2017 10:30:07 AM By: Lenda Kelp PA-C Entered By: Lenda Kelp on 07/04/2017 16:00:54 [name] [name], [name] [name] ([id number]) -------------------------------------------------------------------------------- SuperBill Details Patient Name: [name] [name] Date of Service: 07/04/2017 Medical Record Number: [id number] Patient Account Number: [id number] Date of Birth/Sex: [birth year] (84 y.o. Female) Treating RN: Curtis Sites Primary Care Provider: Barbette Reichmann Other Clinician: Referring Provider: Barbette Reichmann Treating Provider/Extender: Linwood Dibbles, HOYT Weeks in Treatment: 6 Diagnosis Coding ICD-10 Codes Code Description E11.622 Type 2 diabetes mellitus with other skin ulcer I89.0 Lymphedema, not elsewhere classified L97.222 Non-pressure chronic ulcer of left calf with fat layer exposed I50.22 Chronic systolic (congestive) heart failure N18.3 Chronic kidney disease, stage 3 (moderate) Facility Procedures CPT4 Code: 08657846 Description: 99213 - WOUND CARE VISIT-LEV 3 EST PT Modifier: Quantity: 1 Physician Procedures CPT4 Code: [id number] Description: 99213 - WC PHYS LEVEL 3 - EST PT ICD-10 Diagnosis Description E11.622 Type 2 diabetes mellitus with other skin ulcer I89.0 Lymphedema, not elsewhere classified L97.222 Non-pressure chronic ulcer of left calf with fat layer expo  I50.22  Chronic systolic (congestive) heart failure Modifier: sed Quantity: 1 Electronic Signature(s) Signed: 07/04/2017 4:47:20 PM By: Curtis Sites Signed: 07/05/2017 10:30:07 AM By: Lenda Kelp PA-C Entered By: Curtis Sites on 07/04/2017 16:47:20

## 2017-07-11 ENCOUNTER — Encounter: Payer: Medicare Other | Admitting: Physician Assistant

## 2017-07-11 DIAGNOSIS — E11622 Type 2 diabetes mellitus with other skin ulcer: Secondary | ICD-10-CM | POA: Diagnosis not present

## 2017-07-12 NOTE — Progress Notes (Signed)
MY, RINKE ([id number]) Visit Report for 07/11/2017 Arrival Information Details Patient Name: [name] [name], [name] [name] Date of Service: 07/11/2017 2:30 PM Medical Record Number: [id number] Patient Account Number: [id number] Date of Birth/Sex: Feb 25, 1926 (84 y.o. Female) Treating RN: Curtis Sites Primary Care Alistair Senft: Barbette Reichmann Other Clinician: Referring Abbagayle Zaragoza: Barbette Reichmann Treating Rewa Weissberg/Extender: Linwood Dibbles, HOYT Weeks in Treatment: 7 Visit Information History Since Last Visit Added or deleted any medications: No Patient Arrived: Wheel Chair Any new allergies or adverse reactions: No Arrival Time: 14:20 Had a fall or experienced change in No Accompanied By: dtr activities of daily living that may affect Transfer Assistance: Manual risk of falls: Patient Identification Verified: Yes Signs or symptoms of abuse/neglect since last visito No Secondary Verification Process Yes Hospitalized since last visit: No Completed: Has Dressing in Place as Prescribed: Yes Patient Requires Transmission-Based No Has Compression in Place as Prescribed: Yes Precautions: Pain Present Now: No Patient Has Alerts: Yes Patient Alerts: Patient on Blood Thinner DMII warfarin Electronic Signature(s) Signed: 07/11/2017 4:52:11 PM By: Curtis Sites Entered By: Curtis Sites on 07/11/2017 14:20:58 [name] [name] ([id number]) -------------------------------------------------------------------------------- Clinic Level of Care Assessment Details Patient Name: [name] [name] Date of Service: 07/11/2017 2:30 PM Medical Record Number: [id number] Patient Account Number: [id number] Date of Birth/Sex: 06/01/1926 (84 y.o. Female) Treating RN: Curtis Sites Primary Care Antwaine Boomhower: Barbette Reichmann Other Clinician: Referring Wojciech Willetts: Barbette Reichmann Treating Varina Hulon/Extender: Linwood Dibbles, HOYT Weeks in Treatment: 7 Clinic Level of Care Assessment Items TOOL 4 Quantity Score []  - Use  when only an EandM is performed on FOLLOW-UP visit 0 ASSESSMENTS - Nursing Assessment / Reassessment X - Reassessment of Co-morbidities (includes updates in patient status) 1 10 X- 1 5 Reassessment of Adherence to Treatment Plan ASSESSMENTS - Wound and Skin Assessment / Reassessment []  - Simple Wound Assessment / Reassessment - one wound 0 X- 2 5 Complex Wound Assessment / Reassessment - multiple wounds []  - 0 Dermatologic / Skin Assessment (not related to wound area) ASSESSMENTS - Focused Assessment []  - Circumferential Edema Measurements - multi extremities 0 []  - 0 Nutritional Assessment / Counseling / Intervention X- 1 5 Lower Extremity Assessment (monofilament, tuning fork, pulses) []  - 0 Peripheral Arterial Disease Assessment (using hand held doppler) ASSESSMENTS - Ostomy and/or Continence Assessment and Care []  - Incontinence Assessment and Management 0 []  - 0 Ostomy Care Assessment and Management (repouching, etc.) PROCESS - Coordination of Care X - Simple Patient / Family Education for ongoing care 1 15 []  - 0 Complex (extensive) Patient / Family Education for ongoing care []  - 0 Staff obtains Chiropractor, Records, Test Results / Process Orders []  - 0 Staff telephones HHA, Nursing Homes / Clarify orders / etc []  - 0 Routine Transfer to another Facility (non-emergent condition) []  - 0 Routine Hospital Admission (non-emergent condition) []  - 0 New Admissions / Manufacturing engineer / Ordering NPWT, Apligraf, etc. []  - 0 Emergency Hospital Admission (emergent condition) X- 1 10 Simple Discharge Coordination [name] [name], [name] [name] ([id number]) []  - 0 Complex (extensive) Discharge Coordination PROCESS - Special Needs []  - Pediatric / Minor Patient Management 0 []  - 0 Isolation Patient Management []  - 0 Hearing / Language / Visual special needs []  - 0 Assessment of Community assistance (transportation, D/C planning, etc.) []  - 0 Additional assistance / Altered  mentation []  - 0 Support Surface(s) Assessment (bed, cushion, seat, etc.) INTERVENTIONS - Wound Cleansing / Measurement []  - Simple Wound Cleansing - one wound 0 X- 2 5 Complex Wound Cleansing - multiple wounds X- 1 5 Wound Imaging (photographs -  any number of wounds) []  - 0 Wound Tracing (instead of photographs) []  - 0 Simple Wound Measurement - one wound X- 2 5 Complex Wound Measurement - multiple wounds INTERVENTIONS - Wound Dressings X - Small Wound Dressing one or multiple wounds 2 10 []  - 0 Medium Wound Dressing one or multiple wounds []  - 0 Large Wound Dressing one or multiple wounds []  - 0 Application of Medications - topical []  - 0 Application of Medications - injection INTERVENTIONS - Miscellaneous []  - External ear exam 0 []  - 0 Specimen Collection (cultures, biopsies, blood, body fluids, etc.) []  - 0 Specimen(s) / Culture(s) sent or taken to Lab for analysis []  - 0 Patient Transfer (multiple staff / Nurse, adult / Similar devices) []  - 0 Simple Staple / Suture removal (25 or less) []  - 0 Complex Staple / Suture removal (26 or more) []  - 0 Hypo / Hyperglycemic Management (close monitor of Blood Glucose) []  - 0 Ankle / Brachial Index (ABI) - do not check if billed separately X- 1 5 Vital Signs [name] [name], [name] [name] ([id number]) Has the patient been seen at the hospital within the last three years: Yes Total Score: 105 Level Of Care: New/Established - Level 3 Electronic Signature(s) Signed: 07/11/2017 4:52:11 PM By: Curtis Sites Entered By: Curtis Sites on 07/11/2017 16:37:44 [name] [name] ([id number]) -------------------------------------------------------------------------------- Encounter Discharge Information Details Patient Name: [name] [name] Date of Service: 07/11/2017 2:30 PM Medical Record Number: [id number] Patient Account Number: [id number] Date of Birth/Sex: 1925-08-11 (84 y.o. Female) Treating RN: Curtis Sites Primary Care Amani Nodarse:  Barbette Reichmann Other Clinician: Referring Joleena Weisenburger: Barbette Reichmann Treating Neftali Thurow/Extender: Linwood Dibbles, HOYT Weeks in Treatment: 7 Encounter Discharge Information Items Discharge Pain Level: 0 Discharge Condition: Stable Ambulatory Status: Wheelchair Discharge Destination: Home Transportation: Private Auto Accompanied By: dtr Schedule Follow-up Appointment: Yes Medication Reconciliation completed and No provided to Patient/Care Adali Pennings: Provided on Clinical Summary of Care: 07/11/2017 Form Type Recipient Paper Patient MS Electronic Signature(s) Signed: 07/11/2017 4:39:09 PM By: Curtis Sites Entered By: Curtis Sites on 07/11/2017 16:39:09 [name] [name] ([id number]) -------------------------------------------------------------------------------- Lower Extremity Assessment Details Patient Name: [name] [name] Date of Service: 07/11/2017 2:30 PM Medical Record Number: [id number] Patient Account Number: [id number] Date of Birth/Sex: 1926/03/05 (84 y.o. Female) Treating RN: Curtis Sites Primary Care Elijahjames Fuelling: Barbette Reichmann Other Clinician: Referring Madylyn Insco: Barbette Reichmann Treating Arlana Canizales/Extender: Linwood Dibbles, HOYT Weeks in Treatment: 7 Vascular Assessment Pulses: Dorsalis Pedis Palpable: [Left:Yes] [Right:Yes] Posterior Tibial Extremity colors, hair growth, and conditions: Extremity Color: [Left:Hyperpigmented] [Right:Hyperpigmented] Hair Growth on Extremity: [Left:No] [Right:No] Temperature of Extremity: [Left:Warm] [Right:Warm] Capillary Refill: [Left:< 3 seconds] [Right:< 3 seconds] Electronic Signature(s) Signed: 07/11/2017 4:52:11 PM By: Curtis Sites Entered By: Curtis Sites on 07/11/2017 14:31:17 [name] [name] ([id number]) -------------------------------------------------------------------------------- Multi Wound Chart Details Patient Name: [name] [name] Date of Service: 07/11/2017 2:30 PM Medical Record Number: [id number] Patient  Account Number: [id number] Date of Birth/Sex: 1925-07-21 (84 y.o. Female) Treating RN: Curtis Sites Primary Care Sierra Spargo: Barbette Reichmann Other Clinician: Referring Nusayba Cadenas: Barbette Reichmann Treating Petrea Fredenburg/Extender: Linwood Dibbles, HOYT Weeks in Treatment: 7 Vital Signs Height(in): 63 Pulse(bpm): 75 Weight(lbs): 212 Blood Pressure(mmHg): 132/61 Body Mass Index(BMI): 38 Temperature(F): 98.3 Respiratory Rate 22 (breaths/min): Photos: [1:No Photos] [2:No Photos] [N/A:N/A] Wound Location: [1:Left Lower Leg - Posterior] [2:Right Lower Leg - Lateral] [N/A:N/A] Wounding Event: [1:Blister] [2:Blister] [N/A:N/A] Primary Etiology: [1:Diabetic Wound/Ulcer of the Lower Extremity] [2:Diabetic Wound/Ulcer of the Lower Extremity] [N/A:N/A] Secondary Etiology: [1:Lymphedema] [2:Lymphedema] [N/A:N/A] Comorbid History: [1:Cataracts, Anemia, Arrhythmia, Congestive Heart Failure, Hypertension, Type II Diabetes] [2:Cataracts, Anemia, Arrhythmia, Congestive Heart Failure,  Hypertension, Type II Diabetes] [N/A:N/A] Date Acquired: [1:12/28/2016] [2:07/08/2017] [N/A:N/A] Weeks of Treatment: [1:7] [2:0] [N/A:N/A] Wound Status: [1:Open] [2:Open] [N/A:N/A] Measurements L x W x D [1:0.1x0.1x0.1] [2:0.6x0.6x0.1] [N/A:N/A] (cm) Area (cm) : [1:0.008] [2:0.283] [N/A:N/A] Volume (cm) : [1:0.001] [2:0.028] [N/A:N/A] % Reduction in Area: [1:96.60%] [2:N/A] [N/A:N/A] % Reduction in Volume: [1:95.80%] [2:N/A] [N/A:N/A] Classification: [1:Grade 1] [2:Grade 1] [N/A:N/A] Exudate Amount: [1:Large] [2:Large] [N/A:N/A] Exudate Type: [1:Serous] [2:Serous] [N/A:N/A] Exudate Color: [1:amber] [2:amber] [N/A:N/A] Wound Margin: [1:Flat and Intact] [2:Flat and Intact] [N/A:N/A] Granulation Amount: [1:Large (67-100%)] [2:Large (67-100%)] [N/A:N/A] Granulation Quality: [1:Pink] [2:Pink] [N/A:N/A] Necrotic Amount: [1:None Present (0%)] [2:Small (1-33%)] [N/A:N/A] Exposed Structures: [1:Fascia: No Fat Layer (Subcutaneous  Tissue) Exposed: No Tendon: No Muscle: No Joint: No Bone: No] [2:Fascia: No Fat Layer (Subcutaneous Tissue) Exposed: No Tendon: No Muscle: No Joint: No Bone: No] [N/A:N/A] Epithelialization: [1:Large (67-100%)] [2:None] [N/A:N/A] Periwound Skin Texture: [1:Excoriation: No Induration: No] [2:Excoriation: No Induration: No] [N/A:N/A] Callus: No Callus: No Crepitus: No Crepitus: No Rash: No Rash: No Scarring: No Scarring: No Periwound Skin Moisture: Maceration: No Maceration: No N/A Dry/Scaly: No Dry/Scaly: No Periwound Skin Color: Atrophie Blanche: No Atrophie Blanche: No N/A Cyanosis: No Cyanosis: No Ecchymosis: No Ecchymosis: No Erythema: No Erythema: No Hemosiderin Staining: No Hemosiderin Staining: No Mottled: No Mottled: No Pallor: No Pallor: No Rubor: No Rubor: No Temperature: No Abnormality No Abnormality N/A Tenderness on Palpation: Yes Yes N/A Wound Preparation: Ulcer Cleansing: Ulcer Cleansing: N/A Rinsed/Irrigated with Saline Rinsed/Irrigated with Saline Topical Anesthetic Applied: Topical Anesthetic Applied: None Other: lidocaine 4% Treatment Notes Electronic Signature(s) Signed: 07/11/2017 4:52:11 PM By: Curtis Sites Entered By: Curtis Sites on 07/11/2017 14:55:47 [name] [name] ([id number]) -------------------------------------------------------------------------------- Multi-Disciplinary Care Plan Details Patient Name: [name] [name] Date of Service: 07/11/2017 2:30 PM Medical Record Number: [id number] Patient Account Number: [id number] Date of Birth/Sex: 1926/03/19 (84 y.o. Female) Treating RN: Curtis Sites Primary Care Randi [employer]: Barbette Reichmann Other Clinician: Referring Revere Maahs: Barbette Reichmann Treating Lillionna Nabi/Extender: Linwood Dibbles, HOYT Weeks in Treatment: 7 Active Inactive ` Abuse / Safety / Falls / Self Care Management Nursing Diagnoses: Impaired physical mobility Goals: Patient will not experience any injury related to  falls Date Initiated: 05/19/2017 Target Resolution Date: 07/30/2017 Goal Status: Active Interventions: Assess fall risk on admission and as needed Notes: ` Orientation to the Wound Care Program Nursing Diagnoses: Knowledge deficit related to the wound healing center program Goals: Patient/caregiver will verbalize understanding of the Wound Healing Center Program Date Initiated: 05/19/2017 Target Resolution Date: 07/30/2017 Goal Status: Active Interventions: Provide education on orientation to the wound center Notes: ` Wound/Skin Impairment Nursing Diagnoses: Impaired tissue integrity Goals: Ulcer/skin breakdown will heal within 14 weeks Date Initiated: 05/19/2017 Target Resolution Date: 07/30/2017 Goal Status: Active Interventions: [name] [name], [name] [name] ([id number]) Assess patient/caregiver ability to obtain necessary supplies Assess patient/caregiver ability to perform ulcer/skin care regimen upon admission and as needed Assess ulceration(s) every visit Notes: Electronic Signature(s) Signed: 07/11/2017 4:52:11 PM By: Curtis Sites Entered By: Curtis Sites on 07/11/2017 14:55:34 [name] [name] ([id number]) -------------------------------------------------------------------------------- Pain Assessment Details Patient Name: [name] [name] Date of Service: 07/11/2017 2:30 PM Medical Record Number: [id number] Patient Account Number: [id number] Date of Birth/Sex: 1925-10-21 (84 y.o. Female) Treating RN: Curtis Sites Primary Care Leyli Kevorkian: Barbette Reichmann Other Clinician: Referring Keenya Matera: Barbette Reichmann Treating Malinda Mayden/Extender: Linwood Dibbles, HOYT Weeks in Treatment: 7 Active Problems Location of Pain Severity and Description of Pain Patient Has Paino No Site Locations Pain Management and Medication Current Pain Management: Notes Topical or injectable lidocaine is offered to patient for acute pain when surgical debridement  is performed. If needed, Patient  is instructed to use over the counter pain medication for the following 24-48 hours after debridement. Wound care MDs do not prescribed pain medications. Patient has chronic pain or uncontrolled pain. Patient has been instructed to make an appointment with their Primary Care Physician for pain management. Electronic Signature(s) Signed: 07/11/2017 4:52:11 PM By: Curtis Sites Entered By: Curtis Sites on 07/11/2017 14:23:18 [name] [name] ([id number]) -------------------------------------------------------------------------------- Patient/Caregiver Education Details Patient Name: [name] [name] Date of Service: 07/11/2017 2:30 PM Medical Record Number: [id number] Patient Account Number: [id number] Date of Birth/Gender: July 28, 1925 (84 y.o. Female) Treating RN: Curtis Sites Primary Care Physician: Barbette Reichmann Other Clinician: Referring Physician: Barbette Reichmann Treating Physician/Extender: Skeet Simmer in Treatment: 7 Education Assessment Education Provided To: Patient and Caregiver Education Topics Provided Notes please f/u with your PCP r/t increased SOB Electronic Signature(s) Signed: 07/11/2017 4:52:11 PM By: Curtis Sites Entered By: Curtis Sites on 07/11/2017 16:39:39 [name] [name] ([id number]) -------------------------------------------------------------------------------- Wound Assessment Details Patient Name: [name] [name] Date of Service: 07/11/2017 2:30 PM Medical Record Number: [id number] Patient Account Number: [id number] Date of Birth/Sex: 1926-04-15 (84 y.o. Female) Treating RN: Curtis Sites Primary Care Sanye Ledesma: Barbette Reichmann Other Clinician: Referring Shevon Sian: Barbette Reichmann Treating Selim Durden/Extender: Linwood Dibbles, HOYT Weeks in Treatment: 7 Wound Status Wound Number: 1 Primary Diabetic Wound/Ulcer of the Lower Extremity Etiology: Wound Location: Left Lower Leg - Posterior Secondary Lymphedema Wounding Event:  Blister Etiology: Date Acquired: 12/28/2016 Wound Status: Open Weeks Of Treatment: 7 Comorbid Cataracts, Anemia, Arrhythmia, Congestive Clustered Wound: No History: Heart Failure, Hypertension, Type II Diabetes Photos Photo Uploaded By: Curtis Sites on 07/11/2017 16:43:52 Wound Measurements Length: (cm) 0.1 Width: (cm) 0.1 Depth: (cm) 0.1 Area: (cm) 0.008 Volume: (cm) 0.001 % Reduction in Area: 96.6% % Reduction in Volume: 95.8% Epithelialization: Large (67-100%) Tunneling: No Undermining: No Wound Description Classification: Grade 1 Wound Margin: Flat and Intact Exudate Amount: Large Exudate Type: Serous Exudate Color: amber Foul Odor After Cleansing: No Slough/Fibrino Yes Wound Bed Granulation Amount: Large (67-100%) Exposed Structure Granulation Quality: Pink Fascia Exposed: No Necrotic Amount: None Present (0%) Fat Layer (Subcutaneous Tissue) Exposed: No Tendon Exposed: No Muscle Exposed: No Joint Exposed: No Bone Exposed: No Periwound Skin Texture [name] [name], [name] [name] ([id number]) Texture Color No Abnormalities Noted: No No Abnormalities Noted: No Callus: No Atrophie Blanche: No Crepitus: No Cyanosis: No Excoriation: No Ecchymosis: No Induration: No Erythema: No Rash: No Hemosiderin Staining: No Scarring: No Mottled: No Pallor: No Moisture Rubor: No No Abnormalities Noted: No Dry / Scaly: No Temperature / Pain Maceration: No Temperature: No Abnormality Tenderness on Palpation: Yes Wound Preparation Ulcer Cleansing: Rinsed/Irrigated with Saline Topical Anesthetic Applied: None Treatment Notes Wound #1 (Left, Posterior Lower Leg) 1. Cleansed with: Clean wound with Normal Saline 4. Dressing Applied: Prisma Ag 5. Secondary Dressing Applied Kerlix/Conform Non-Adherent pad 7. Secured with Patient to wear own compression stockings Notes coban lightly to secure Electronic Signature(s) Signed: 07/11/2017 4:52:11 PM By: Curtis Sites Entered By: Curtis Sites on 07/11/2017 14:30:12 [name] [name] ([id number]) -------------------------------------------------------------------------------- Wound Assessment Details Patient Name: [name] [name] Date of Service: 07/11/2017 2:30 PM Medical Record Number: [id number] Patient Account Number: [id number] Date of Birth/Sex: 02/04/1926 (84 y.o. Female) Treating RN: Curtis Sites Primary Care Xitlalic Maslin: Barbette Reichmann Other Clinician: Referring Esmerelda Finnigan: Barbette Reichmann Treating Codee Tutson/Extender: STONE III, HOYT Weeks in Treatment: 7 Wound Status Wound Number: 2 Primary Diabetic Wound/Ulcer of the Lower Extremity Etiology: Wound Location: Right Lower Leg - Lateral Secondary Lymphedema Wounding Event: Blister Etiology: Date Acquired: 07/08/2017 Wound Status: Open  Weeks Of Treatment: 0 Comorbid Cataracts, Anemia, Arrhythmia, Congestive Clustered Wound: No History: Heart Failure, Hypertension, Type II Diabetes Photos Photo Uploaded By: Curtis Sitesorthy, Joanna on 07/11/2017 16:44:02 Wound Measurements Length: (cm) 0.6 Width: (cm) 0.6 Depth: (cm) 0.1 Area: (cm) 0.283 Volume: (cm) 0.028 % Reduction in Area: % Reduction in Volume: Epithelialization: None Tunneling: No Undermining: No Wound Description Classification: Grade 1 Wound Margin: Flat and Intact Exudate Amount: Large Exudate Type: Serous Exudate Color: amber Foul Odor After Cleansing: No Slough/Fibrino Yes Wound Bed Granulation Amount: Large (67-100%) Exposed Structure Granulation Quality: Pink Fascia Exposed: No Necrotic Amount: Small (1-33%) Fat Layer (Subcutaneous Tissue) Exposed: No Necrotic Quality: Adherent Slough Tendon Exposed: No Muscle Exposed: No Joint Exposed: No Bone Exposed: No Periwound Skin Texture Olmeda, Jakaylee ([id number]) Texture Color No Abnormalities Noted: No No Abnormalities Noted: No Callus: No Atrophie Blanche: No Crepitus: No Cyanosis: No Excoriation:  No Ecchymosis: No Induration: No Erythema: No Rash: No Hemosiderin Staining: No Scarring: No Mottled: No Pallor: No Moisture Rubor: No No Abnormalities Noted: No Dry / Scaly: No Temperature / Pain Maceration: No Temperature: No Abnormality Tenderness on Palpation: Yes Wound Preparation Ulcer Cleansing: Rinsed/Irrigated with Saline Topical Anesthetic Applied: Other: lidocaine 4%, Treatment Notes Wound #2 (Right, Lateral Lower Leg) 1. Cleansed with: Clean wound with Normal Saline 4. Dressing Applied: Prisma Ag 5. Secondary Dressing Applied Kerlix/Conform Non-Adherent pad 7. Secured with Patient to wear own compression stockings Notes coban lightly to secure Electronic Signature(s) Signed: 07/11/2017 4:52:11 PM By: Curtis Sitesorthy, Joanna Entered By: Curtis Sitesorthy, Joanna on 07/11/2017 14:29:56 [name] BudSIEGEL, [name] [name] ([id number]) -------------------------------------------------------------------------------- Vitals Details Patient Name: [name] BudSIEGEL, [name] [name] Date of Service: 07/11/2017 2:30 PM Medical Record Number: [id number] Patient Account Number: [id number] Date of Birth/Sex: 1925-10-24 (84 y.o. Female) Treating RN: Curtis Sitesorthy, Joanna Primary Care Lakya Schrupp: Barbette ReichmannHande, Vishwanath Other Clinician: Referring Clarita Mcelvain: Barbette ReichmannHande, Vishwanath Treating Liddie Chichester/Extender: Linwood DibblesSTONE III, HOYT Weeks in Treatment: 7 Vital Signs Time Taken: 14:23 Temperature (F): 98.3 Height (in): 63 Pulse (bpm): 75 Weight (lbs): 212 Respiratory Rate (breaths/min): 22 Body Mass Index (BMI): 37.6 Blood Pressure (mmHg): 132/61 Reference Range: 80 - 120 mg / dl Electronic Signature(s) Signed: 07/11/2017 4:52:11 PM By: Curtis Sitesorthy, Joanna Entered By: Curtis Sitesorthy, Joanna on 07/11/2017 14:23:48

## 2017-07-12 NOTE — Progress Notes (Signed)
CLEMENCIA, HELZER ([id number]) Visit Report for 07/11/2017 Chief Complaint Document Details Patient Name: [name] [name], [name] [name] Date of Service: 07/11/2017 2:30 PM Medical Record Number: [id number] Patient Account Number: [id number] Date of Birth/Sex: [birth year] (84 y.o. Female) Treating RN: Curtis Sites Primary Care Provider: Barbette Reichmann Other Clinician: Referring Provider: Barbette Reichmann Treating Provider/Extender: Linwood Dibbles, Deardra Hinkley Weeks in Treatment: 7 Information Obtained from: Patient Chief Complaint Patient presents to the wound care center for a consult due non healing wound to the left lower extremity with bilateral massive swelling of the legs Electronic Signature(s) Signed: 07/11/2017 4:26:52 PM By: Lenda Kelp PA-C Entered By: Lenda Kelp on 07/11/2017 14:17:54 [place], Claris Che ([id number]) -------------------------------------------------------------------------------- HPI Details Patient Name: [name] [name] Date of Service: 07/11/2017 2:30 PM Medical Record Number: [id number] Patient Account Number: [id number] Date of Birth/Sex: February 07, 1926 (84 y.o. Female) Treating RN: Curtis Sites Primary Care Provider: Barbette Reichmann Other Clinician: Referring Provider: Barbette Reichmann Treating Provider/Extender: Linwood Dibbles, Kashawn Manzano Weeks in Treatment: 7 History of Present Illness HPI Description: 84 year old patient seen by her PCP and referred to our center for left lower extremity stasis ulceration. She is known to have diabetes mellitus type 2, hypertension,status post cholecystectomy and open heart surgery, and atrial fibrillation and has bilateral lower extremity lymphedema with oozing of fluid. Her most recent hemoglobin A1c was 7.2. after assessment by the PCP she was put on torsemide 20 mg daily, echo was noted to have an ejection fraction of more than 55% with moderate MR, and the patient was maintained on warfarin for atrial fibrillation. the patient has been  living in [place] for about a year but has been traveling a bit and has not had any arterial or venous studies done recently. Her last echo was about a year and a half ago. 05/27/2017 -- she has not had any dressing changes since the last time she was here as a home health did not turn up. Her arterial duplex study is scheduled for next week. 06/03/2017 -- -- had a lower extremity arterial study done and there was arterial wall calcification and the resting ABI was noncompressible bilaterally. The right and left toe brachial indices were abnormal with the left first toe pressure being 76 mmHg and the right toe pressure was 89 mmHg. The right toe brachial index was 0.55 and the left toe brachial index was 0.47. 07/04/17 on evaluation today patient appears to be doing well in regard to her left lower chimney ulcer. She has been tolerating the current treatment plan without any complication. Unfortunately they did get compression hose delivered that were 30-40 mmHg but unfortunately she is unable to wear these. Her daughter was able to find 18 mmHg compression socks at the drugstore which she has been able to tolerate without any complication. This seems like it may be a better option for her. It's not as much compression as I would like but also it's something she's actually able to wear and something is going to be better than nothing. 07/11/17 on evaluation today patient appears to be doing fairly well regard to her left lower extremity ulcer. Unfortunately she does have a new blister on the right lower extremity which has opened at this point. Fortunately there does not appear to be any evidence of infection which is good news. With that being said one of the bigger issues today is that she seems to be having quite a bit of difficulty breathing she has a lot of audible rhonchi even before I listen to her with the stethoscope.  No fevers, chills, nausea, or vomiting noted at this time. She does  seem to be feeling very poorly according to her daughter she's also not walking and she normally would presumably this is due to the fact that she is short of breath. Electronic Signature(s) Signed: 07/11/2017 4:26:52 PM By: Lenda Kelp PA-C Entered By: Lenda Kelp on 07/11/2017 15:14:59 [name] [name] ([id number]) -------------------------------------------------------------------------------- Physical Exam Details Patient Name: [name] [name] Date of Service: 07/11/2017 2:30 PM Medical Record Number: [id number] Patient Account Number: [id number] Date of Birth/Sex: February 02, 1926 (84 y.o. Female) Treating RN: Curtis Sites Primary Care Provider: Barbette Reichmann Other Clinician: Referring Provider: Barbette Reichmann Treating Provider/Extender: Linwood Dibbles, Onelia Cadmus Weeks in Treatment: 7 Constitutional sitting or standing blood pressure is within target range for patient.. pulse regular and within target range for patient.Marland Kitchen respiratory effort pronounced and rate above normal.. temperature within target range for patient.. Obese and well-hydrated in no acute distress. Respiratory labored, rapid respiration. Wheezing noted in bilateral lung fields with rhonchi. Cardiovascular trace pitting edema of the bilateral lower extremities. Musculoskeletal Patient unable to walk without assistance. Psychiatric Patient is not able to cooperate in decision making regarding care. Patient is oriented to person only. patient is agitated. Notes Patient's wound on the left lower extremity really appears to most likely be healed although there may still be a small opening at this point. I'm going to watch this for at least one more week. With that being said she does have a blister on the right lower extremity the blister was Artie opened skin overlying was coming off. I did using gauze and saline remove the remainder of the line dead skin she tolerated well without severe pain. There was some discomfort  however. Electronic Signature(s) Signed: 07/11/2017 4:26:52 PM By: Lenda Kelp PA-C Entered By: Lenda Kelp on 07/11/2017 15:16:45 [name] [name] ([id number]) -------------------------------------------------------------------------------- Physician Orders Details Patient Name: [name] [name] Date of Service: 07/11/2017 2:30 PM Medical Record Number: [id number] Patient Account Number: [id number] Date of Birth/Sex: 1926-01-20 (84 y.o. Female) Treating RN: Curtis Sites Primary Care Provider: Barbette Reichmann Other Clinician: Referring Provider: Barbette Reichmann Treating Provider/Extender: Linwood Dibbles, Geniva Lohnes Weeks in Treatment: 7 Verbal / Phone Orders: No Diagnosis Coding ICD-10 Coding Code Description E11.622 Type 2 diabetes mellitus with other skin ulcer I89.0 Lymphedema, not elsewhere classified L97.222 Non-pressure chronic ulcer of left calf with fat layer exposed I50.22 Chronic systolic (congestive) heart failure N18.3 Chronic kidney disease, stage 3 (moderate) Wound Cleansing Wound #1 Left,Posterior Lower Leg o Clean wound with Normal Saline. o May Shower, gently pat wound dry prior to applying new dressing. Anesthetic (add to Medication List) Wound #1 Left,Posterior Lower Leg o Topical Lidocaine 4% cream applied to wound bed prior to debridement (In Clinic Only). Primary Wound Dressing Wound #1 Left,Posterior Lower Leg o Non-adherent pad Wound #2 Right,Lateral Lower Leg o Prisma Ag Secondary Dressing Wound #1 Left,Posterior Lower Leg o Non-adherent pad - secure lightly with coban - DO NOT WRAP COBAN TIGHTLY Wound #2 Right,Lateral Lower Leg o Non-adherent pad - secure lightly with coban - DO NOT WRAP COBAN TIGHTLY Dressing Change Frequency Wound #1 Left,Posterior Lower Leg o Change dressing every other day. Wound #2 Right,Lateral Lower Leg o Change dressing every other day. Follow-up Appointments Wound #1 Left,Posterior Lower Leg o  Return Appointment in 1 week. [place], Shadana ([id number]) Wound #2 Right,Lateral Lower Leg o Return Appointment in 1 week. Edema Control Wound #1 Left,Posterior Lower Leg o Patient to wear own compression stockings o Elevate legs to the  level of the heart and pump ankles as often as possible Wound #2 Right,Lateral Lower Leg o Patient to wear own compression stockings o Elevate legs to the level of the heart and pump ankles as often as possible Additional Orders / Instructions Wound #1 Left,Posterior Lower Leg o Increase protein intake. o Other: - Please try to keep blood sugars below 180. Please add over the counter vitamin C, multivitamin and zinc supplements to your diet. Wound #2 Right,Lateral Lower Leg o Increase protein intake. o Other: - Please try to keep blood sugars below 180. Please add over the counter vitamin C, multivitamin and zinc supplements to your diet. Home Health Wound #1 Left,Posterior Lower Leg o Continue Home Health Visits - [facility] o Home Health Nurse may visit PRN to address patientos wound care needs. o FACE TO FACE ENCOUNTER: MEDICARE and MEDICAID PATIENTS: I certify that this patient is under my care and that I had a face-to-face encounter that meets the physician face-to-face encounter requirements with this patient on this date. The encounter with the patient was in whole or in part for the following MEDICAL CONDITION: (primary reason for Home Healthcare) MEDICAL NECESSITY: I certify, that based on my findings, NURSING services are a medically necessary home health service. HOME BOUND STATUS: I certify that my clinical findings support that this patient is homebound (i.e., Due to illness or injury, pt requires aid of supportive devices such as crutches, cane, wheelchairs, walkers, the use of special transportation or the assistance of another person to leave their place of residence. There is a normal inability to leave the  home and doing so requires considerable and taxing effort. Other absences are for medical reasons / religious services and are infrequent or of short duration when for other reasons). o If current dressing causes regression in wound condition, may D/C ordered dressing product/s and apply Normal Saline Moist Dressing daily until next Wound Healing Center / Other MD appointment. Notify Wound Healing Center of regression in wound condition at [phone]. o Please direct any NON-WOUND related issues/requests for orders to patient's Primary Care Physician Wound #2 Right,Lateral Lower Leg o Continue Home Health Visits - [facility] o Home Health Nurse may visit PRN to address patientos wound care needs. o FACE TO FACE ENCOUNTER: MEDICARE and MEDICAID PATIENTS: I certify that this patient is under my care and that I had a face-to-face encounter that meets the physician face-to-face encounter requirements with this patient on this date. The encounter with the patient was in whole or in part for the following MEDICAL CONDITION: (primary reason for Home Healthcare) MEDICAL NECESSITY: I certify, that based on my findings, NURSING services are a medically necessary home health service. HOME BOUND STATUS: I certify that my clinical findings support that this patient is homebound (i.e., Due to illness or injury, pt requires aid of supportive devices such as crutches, cane, wheelchairs, walkers, the use of special transportation or the assistance of another person to leave their place of residence. There is a normal inability to leave the home and doing so requires considerable and taxing effort. Other absences are for medical reasons / religious services and are infrequent or of short duration when for other reasons). TAMEISHA, COVELL ([id number]) o If current dressing causes regression in wound condition, may D/C ordered dressing product/s and apply Normal Saline Moist Dressing daily until next  Wound Healing Center / Other MD appointment. Notify Wound Healing Center of regression in wound condition at [phone]. o Please direct any NON-WOUND related issues/requests for  orders to patient's Primary Care Physician Electronic Signature(s) Signed: 07/11/2017 4:26:52 PM By: Lenda Kelp PA-C Signed: 07/11/2017 4:52:11 PM By: Curtis Sites Entered By: Curtis Sites on 07/11/2017 14:59:32 [name] [name] ([id number]) -------------------------------------------------------------------------------- Problem List Details Patient Name: [name] [name] Date of Service: 07/11/2017 2:30 PM Medical Record Number: [id number] Patient Account Number: [id number] Date of Birth/Sex: 1926-05-29 (84 y.o. Female) Treating RN: Curtis Sites Primary Care Provider: Barbette Reichmann Other Clinician: Referring Provider: Barbette Reichmann Treating Provider/Extender: Linwood Dibbles, Trafton Roker Weeks in Treatment: 7 Active Problems ICD-10 Encounter Code Description Active Date Diagnosis E11.622 Type 2 diabetes mellitus with other skin ulcer 05/19/2017 Yes I89.0 Lymphedema, not elsewhere classified 05/19/2017 Yes L97.222 Non-pressure chronic ulcer of left calf with fat layer exposed 05/19/2017 Yes L97.212 Non-pressure chronic ulcer of right calf with fat layer exposed 07/11/2017 Yes I50.22 Chronic systolic (congestive) heart failure 05/19/2017 Yes N18.3 Chronic kidney disease, stage 3 (moderate) 05/19/2017 Yes Inactive Problems Resolved Problems Electronic Signature(s) Signed: 07/11/2017 4:26:52 PM By: Lenda Kelp PA-C Entered By: Lenda Kelp on 07/11/2017 15:14:44 [name] [name] ([id number]) -------------------------------------------------------------------------------- Progress Note Details Patient Name: [name] [name] Date of Service: 07/11/2017 2:30 PM Medical Record Number: [id number] Patient Account Number: [id number] Date of Birth/Sex: 07/23/1925 (84 y.o. Female) Treating RN: Curtis Sites Primary Care Provider: Barbette Reichmann Other Clinician: Referring Provider: Barbette Reichmann Treating Provider/Extender: Linwood Dibbles, Ajai Terhaar Weeks in Treatment: 7 Subjective Chief Complaint Information obtained from Patient Patient presents to the wound care center for a consult due non healing wound to the left lower extremity with bilateral massive swelling of the legs History of Present Illness (HPI) 84 year old patient seen by her PCP and referred to our center for left lower extremity stasis ulceration. She is known to have diabetes mellitus type 2, hypertension,status post cholecystectomy and open heart surgery, and atrial fibrillation and has bilateral lower extremity lymphedema with oozing of fluid. Her most recent hemoglobin A1c was 7.2. after assessment by the PCP she was put on torsemide 20 mg daily, echo was noted to have an ejection fraction of more than 55% with moderate MR, and the patient was maintained on warfarin for atrial fibrillation. the patient has been living in [place] for about a year but has been traveling a bit and has not had any arterial or venous studies done recently. Her last echo was about a year and a half ago. 05/27/2017 -- she has not had any dressing changes since the last time she was here as a home health did not turn up. Her arterial duplex study is scheduled for next week. 06/03/2017 -- -- had a lower extremity arterial study done and there was arterial wall calcification and the resting ABI was noncompressible bilaterally. The right and left toe brachial indices were abnormal with the left first toe pressure being 76 mmHg and the right toe pressure was 89 mmHg. The right toe brachial index was 0.55 and the left toe brachial index was 0.47. 07/04/17 on evaluation today patient appears to be doing well in regard to her left lower chimney ulcer. She has been tolerating the current treatment plan without any complication. Unfortunately they  did get compression hose delivered that were 30-40 mmHg but unfortunately she is unable to wear these. Her daughter was able to find 18 mmHg compression socks at the drugstore which she has been able to tolerate without any complication. This seems like it may be a better option for her. It's not as much compression as I would like but also it's something she's  actually able to wear and something is going to be better than nothing. 07/11/17 on evaluation today patient appears to be doing fairly well regard to her left lower extremity ulcer. Unfortunately she does have a new blister on the right lower extremity which has opened at this point. Fortunately there does not appear to be any evidence of infection which is good news. With that being said one of the bigger issues today is that she seems to be having quite a bit of difficulty breathing she has a lot of audible rhonchi even before I listen to her with the stethoscope. No fevers, chills, nausea, or vomiting noted at this time. She does seem to be feeling very poorly according to her daughter she's also not walking and she normally would presumably this is due to the fact that she is short of breath. Patient History Information obtained from Patient. Family History Cancer - Child, Heart Disease - Mother, No family history of Diabetes, Hereditary Spherocytosis, Hypertension, Kidney Disease, Lung Disease, Seizures, Stroke, Thyroid Problems, Tuberculosis. SHANEIL, YAZDI ([id number]) Social History Former smoker - quit 60 years ago, Marital Status - Widowed, Alcohol Use - Never, Drug Use - No History, Caffeine Use - Daily. Medical And Surgical History Notes Eyes macular degeneration Review of Systems (ROS) Constitutional Symptoms (General Health) Denies complaints or symptoms of Fever, Chills. Respiratory The patient has no complaints or symptoms. Cardiovascular Complains or has symptoms of LE edema. Psychiatric The patient has no  complaints or symptoms. Objective Constitutional sitting or standing blood pressure is within target range for patient.. pulse regular and within target range for patient.Marland Kitchen respiratory effort pronounced and rate above normal.. temperature within target range for patient.. Obese and well-hydrated in no acute distress. Vitals Time Taken: 2:23 PM, Height: 63 in, Weight: 212 lbs, BMI: 37.6, Temperature: 98.3 F, Pulse: 75 bpm, Respiratory Rate: 22 breaths/min, Blood Pressure: 132/61 mmHg. Respiratory labored, rapid respiration. Wheezing noted in bilateral lung fields with rhonchi. Cardiovascular trace pitting edema of the bilateral lower extremities. Musculoskeletal Patient unable to walk without assistance. Psychiatric Patient is not able to cooperate in decision making regarding care. Patient is oriented to person only. patient is agitated. General Notes: Patient's wound on the left lower extremity really appears to most likely be healed although there may still be a small opening at this point. I'm going to watch this for at least one more week. With that being said she does have a blister on the right lower extremity the blister was Artie opened skin overlying was coming off. I did using gauze and saline remove the remainder of the line dead skin she tolerated well without severe pain. There was some discomfort however. Integumentary (Hair, Skin) Wound #1 status is Open. Original cause of wound was Blister. The wound is located on the Left,Posterior Lower Leg. The wound measures 0.1cm length x 0.1cm width x 0.1cm depth; 0.008cm^2 area and 0.001cm^3 volume. There is no tunneling or undermining noted. There is a large amount of serous drainage noted. The wound margin is flat and intact. There is large Quirarte, Sokha ([id number]) (67-100%) pink granulation within the wound bed. There is no necrotic tissue within the wound bed. The periwound skin appearance did not exhibit: Callus, Crepitus,  Excoriation, Induration, Rash, Scarring, Dry/Scaly, Maceration, Atrophie Blanche, Cyanosis, Ecchymosis, Hemosiderin Staining, Mottled, Pallor, Rubor, Erythema. Periwound temperature was noted as No Abnormality. The periwound has tenderness on palpation. Wound #2 status is Open. Original cause of wound was Blister. The wound is located on the Right,Lateral  Lower Leg. The wound measures 0.6cm length x 0.6cm width x 0.1cm depth; 0.283cm^2 area and 0.028cm^3 volume. There is no tunneling or undermining noted. There is a large amount of serous drainage noted. The wound margin is flat and intact. There is large (67-100%) pink granulation within the wound bed. There is a small (1-33%) amount of necrotic tissue within the wound bed including Adherent Slough. The periwound skin appearance did not exhibit: Callus, Crepitus, Excoriation, Induration, Rash, Scarring, Dry/Scaly, Maceration, Atrophie Blanche, Cyanosis, Ecchymosis, Hemosiderin Staining, Mottled, Pallor, Rubor, Erythema. Periwound temperature was noted as No Abnormality. The periwound has tenderness on palpation. Assessment Active Problems ICD-10 E11.622 - Type 2 diabetes mellitus with other skin ulcer I89.0 - Lymphedema, not elsewhere classified L97.222 - Non-pressure chronic ulcer of left calf with fat layer exposed L97.212 - Non-pressure chronic ulcer of right calf with fat layer exposed I50.22 - Chronic systolic (congestive) heart failure N18.3 - Chronic kidney disease, stage 3 (moderate) Plan Wound Cleansing: Wound #1 Left,Posterior Lower Leg: Clean wound with Normal Saline. May Shower, gently pat wound dry prior to applying new dressing. Anesthetic (add to Medication List): Wound #1 Left,Posterior Lower Leg: Topical Lidocaine 4% cream applied to wound bed prior to debridement (In Clinic Only). Primary Wound Dressing: Wound #1 Left,Posterior Lower Leg: Non-adherent pad Wound #2 Right,Lateral Lower Leg: Prisma Ag Secondary  Dressing: Wound #1 Left,Posterior Lower Leg: Non-adherent pad - secure lightly with coban - DO NOT WRAP COBAN TIGHTLY Wound #2 Right,Lateral Lower Leg: Non-adherent pad - secure lightly with coban - DO NOT WRAP COBAN TIGHTLY Dressing Change Frequency: Wound #1 Left,Posterior Lower Leg: Change dressing every other day. Wound #2 Right,Lateral Lower Leg: Change dressing every other day. Follow-up Appointments: TAMAKA, SAWIN ([id number]) Wound #1 Left,Posterior Lower Leg: Return Appointment in 1 week. Wound #2 Right,Lateral Lower Leg: Return Appointment in 1 week. Edema Control: Wound #1 Left,Posterior Lower Leg: Patient to wear own compression stockings Elevate legs to the level of the heart and pump ankles as often as possible Wound #2 Right,Lateral Lower Leg: Patient to wear own compression stockings Elevate legs to the level of the heart and pump ankles as often as possible Additional Orders / Instructions: Wound #1 Left,Posterior Lower Leg: Increase protein intake. Other: - Please try to keep blood sugars below 180. Please add over the counter vitamin C, multivitamin and zinc supplements to your diet. Wound #2 Right,Lateral Lower Leg: Increase protein intake. Other: - Please try to keep blood sugars below 180. Please add over the counter vitamin C, multivitamin and zinc supplements to your diet. Home Health: Wound #1 Left,Posterior Lower Leg: Continue Home Health Visits - [facility] Nurse may visit PRN to address patient s wound care needs. FACE TO FACE ENCOUNTER: MEDICARE and MEDICAID PATIENTS: I certify that this patient is under my care and that I had a face-to-face encounter that meets the physician face-to-face encounter requirements with this patient on this date. The encounter with the patient was in whole or in part for the following MEDICAL CONDITION: (primary reason for Home Healthcare) MEDICAL NECESSITY: I certify, that based on my findings, NURSING  services are a medically necessary home health service. HOME BOUND STATUS: I certify that my clinical findings support that this patient is homebound (i.e., Due to illness or injury, pt requires aid of supportive devices such as crutches, cane, wheelchairs, walkers, the use of special transportation or the assistance of another person to leave their place of residence. There is a normal inability to leave the home  and doing so requires considerable and taxing effort. Other absences are for medical reasons / religious services and are infrequent or of short duration when for other reasons). If current dressing causes regression in wound condition, may D/C ordered dressing product/s and apply Normal Saline Moist Dressing daily until next Wound Healing Center / Other MD appointment. Notify Wound Healing Center of regression in wound condition at [phone]. Please direct any NON-WOUND related issues/requests for orders to patient's Primary Care Physician Wound #2 Right,Lateral Lower Leg: Continue Home Health Visits - [facility] Nurse may visit PRN to address patient s wound care needs. FACE TO FACE ENCOUNTER: MEDICARE and MEDICAID PATIENTS: I certify that this patient is under my care and that I had a face-to-face encounter that meets the physician face-to-face encounter requirements with this patient on this date. The encounter with the patient was in whole or in part for the following MEDICAL CONDITION: (primary reason for Home Healthcare) MEDICAL NECESSITY: I certify, that based on my findings, NURSING services are a medically necessary home health service. HOME BOUND STATUS: I certify that my clinical findings support that this patient is homebound (i.e., Due to illness or injury, pt requires aid of supportive devices such as crutches, cane, wheelchairs, walkers, the use of special transportation or the assistance of another person to leave their place of residence. There is a normal  inability to leave the home and doing so requires considerable and taxing effort. Other absences are for medical reasons / religious services and are infrequent or of short duration when for other reasons). If current dressing causes regression in wound condition, may D/C ordered dressing product/s and apply Normal Saline Moist Dressing daily until next Wound Healing Center / Other MD appointment. Notify Wound Healing Center of regression in wound condition at [phone]. Please direct any NON-WOUND related issues/requests for orders to patient's Primary Care Physician TEWANA, BOHLEN ([id number]) Due to the fact that patient seem to be in somewhat an uncomfortable state with her respiratory effort at this point in listening to her lungs I was concerned that she may need to see her primary care provider at least. I therefore did contact [place] [facility] and spoke with one of the triage nurse is who is going to send a message to patient's primary care provider. I did give the daughter's phone number for them to get in touch with her in order to see if there's anything they needed to do such as see her today or otherwise recommend a treatment course. Have minimum height for light the patient may need inhalers but I do want them to be seen and not just prescribed this obviously. Patient's daughter was appreciative. In fact in the end I recommended they just go over to her little clinic and the daughter could go in and check with them to see what her primary would like to do before they travel back 20 miles home. In regard to her wounds her left lower extremity ulcer appears to be doing excellent right lower extremity is also likewise healing very well. I do not think this blister on the right lower extremity is going to be a major issue I think it will heal quite nicely. We will see her for reevaluation in one weeks time. Please see above for specific wound care  orders. We will see patient for re-evaluation in 1 week(s) here in the clinic. If anything worsens or changes patient will contact our office for additional recommendations. Electronic Signature(s)  Signed: 07/11/2017 4:26:52 PM By: Lenda KelpStone III, Trason Shifflet PA-C Entered By: Lenda KelpStone III, Symiah Nowotny on 07/11/2017 15:18:54 [name] BudSIEGEL, Annastasia ([id number]) -------------------------------------------------------------------------------- ROS/PFSH Details Patient Name: [name] BudSIEGEL, Carigan Date of Service: 07/11/2017 2:30 PM Medical Record Number: [id number] Patient Account Number: [id number] Date of Birth/Sex: 1925-07-31 (84 y.o. Female) Treating RN: Curtis Sitesorthy, Joanna Primary Care Provider: Barbette ReichmannHande, Vishwanath Other Clinician: Referring Provider: Barbette ReichmannHande, Vishwanath Treating Provider/Extender: Linwood DibblesSTONE III, Tashana Haberl Weeks in Treatment: 7 Information Obtained From Patient Wound History Do you currently have one or more open woundso Yes How many open wounds do you currently haveo 2 Approximately how long have you had your woundso 5 months How have you been treating your wound(s) until nowo camphil Has your wound(s) ever healed and then re-openedo No Have you had any lab work done in the past montho No Have you tested positive for an antibiotic resistant organism (MRSA, VRE)o No Have you tested positive for osteomyelitis (bone infection)o No Have you had any tests for circulation on your legso No Have you had other problems associated with your woundso Swelling Constitutional Symptoms (General Health) Complaints and Symptoms: Negative for: Fever; Chills Cardiovascular Complaints and Symptoms: Positive for: LE edema Medical History: Positive for: Arrhythmia - a fib; Congestive Heart Failure; Hypertension Negative for: Angina; Coronary Artery Disease; Deep Vein Thrombosis; Hypotension; Myocardial Infarction; Peripheral Arterial Disease; Peripheral Venous Disease; Phlebitis; Vasculitis Eyes Medical History: Positive for:  Cataracts - removed Past Medical History Notes: macular degeneration Hematologic/Lymphatic Medical History: Positive for: Anemia Negative for: Hemophilia; Human Immunodeficiency Virus; Lymphedema; Sickle Cell Disease Respiratory Complaints and Symptoms: No Complaints or Symptoms Medical History: Negative for: Aspiration; Asthma; Chronic Obstructive Pulmonary Disease (COPD); Pneumothorax; Sleep Apnea; [name] BudSIEGEL, Dhiya ([id number]) Tuberculosis Gastrointestinal Medical History: Negative for: Cirrhosis ; Colitis; Crohnos; Hepatitis A; Hepatitis B; Hepatitis C Endocrine Medical History: Positive for: Type II Diabetes Treated with: Insulin Immunological Medical History: Negative for: Lupus Erythematosus; Raynaudos; Scleroderma Musculoskeletal Medical History: Negative for: Gout; Rheumatoid Arthritis; Osteoarthritis; Osteomyelitis Neurologic Medical History: Negative for: Dementia; Neuropathy Oncologic Medical History: Negative for: Received Chemotherapy; Received Radiation Psychiatric Complaints and Symptoms: No Complaints or Symptoms HBO Extended History Items Eyes: Cataracts Immunizations Pneumococcal Vaccine: Received Pneumococcal Vaccination: Yes Immunization Notes: up to date Implantable Devices Family and Social History Cancer: Yes - Child; Diabetes: No; Heart Disease: Yes - Mother; Hereditary Spherocytosis: No; Hypertension: No; Kidney Disease: No; Lung Disease: No; Seizures: No; Stroke: No; Thyroid Problems: No; Tuberculosis: No; Former smoker - quit 60 years ago; Marital Status - Widowed; Alcohol Use: Never; Drug Use: No History; Caffeine Use: Daily; Financial Concerns: No; Food, Clothing or Shelter Needs: No; Support System Lacking: No; Transportation Concerns: No; Advanced Directives: No; Patient does not want information on Advanced Directives Physician Ralene Okffirmation Mccleery, Shanayah ([id number]) I have reviewed and agree with the above information. Electronic  Signature(s) Signed: 07/11/2017 4:26:52 PM By: Lenda KelpStone III, Dorthie Santini PA-C Signed: 07/11/2017 4:52:11 PM By: Curtis Sitesorthy, Joanna Entered By: Lenda KelpStone III, Taneika Choi on 07/11/2017 15:15:38 VeazieSIEGEL, Claris CheMARGARET ([id number]) -------------------------------------------------------------------------------- SuperBill Details Patient Name: [name] BudSIEGEL, Kanchan Date of Service: 07/11/2017 Medical Record Number: [id number] Patient Account Number: [id number] Date of Birth/Sex: 1925-07-31 (84 y.o. Female) Treating RN: Curtis Sitesorthy, Joanna Primary Care Provider: Barbette ReichmannHande, Vishwanath Other Clinician: Referring Provider: Barbette ReichmannHande, Vishwanath Treating Provider/Extender: Linwood DibblesSTONE III, Alizon Schmeling Weeks in Treatment: 7 Diagnosis Coding ICD-10 Codes Code Description E11.622 Type 2 diabetes mellitus with other skin ulcer I89.0 Lymphedema, not elsewhere classified L97.222 Non-pressure chronic ulcer of left calf with fat layer exposed L97.212 Non-pressure chronic ulcer of right calf with fat layer exposed I50.22 Chronic systolic (congestive)  heart failure N18.3 Chronic kidney disease, stage 3 (moderate) Facility Procedures CPT4 Code: 16109604 Description: 99213 - WOUND CARE VISIT-LEV 3 EST PT Modifier: Quantity: 1 Physician Procedures CPT4 Code: 5409811 Description: 99214 - WC PHYS LEVEL 4 - EST PT ICD-10 Diagnosis Description E11.622 Type 2 diabetes mellitus with other skin ulcer I89.0 Lymphedema, not elsewhere classified L97.222 Non-pressure chronic ulcer of left calf with fat layer expo L97.212  Non-pressure chronic ulcer of right calf with fat layer exp Modifier: sed osed Quantity: 1 Electronic Signature(s) Signed: 07/11/2017 4:37:53 PM By: Curtis Sites Previous Signature: 07/11/2017 4:26:52 PM Version By: Lenda Kelp PA-C Entered By: Curtis Sites on 07/11/2017 16:37:53

## 2017-07-13 ENCOUNTER — Telehealth: Payer: Self-pay | Admitting: Cardiovascular Disease

## 2017-07-13 NOTE — Telephone Encounter (Signed)
Received notification from [employer]lier that lympha press pump delivered January 18. Compression therapy training provided to patient and daughter.  Faxed Certificate of Medical Necessity for Medicare billing to [phone]

## 2017-07-13 NOTE — Telephone Encounter (Signed)
[employer] calling in regards to forms that were faxed They cannot accept the stamped copies  If you could have Dr Kirke CorinArida sign both copies then refax They are aware Dr Kirke CorinArida will not be in until Friday

## 2017-07-15 NOTE — Telephone Encounter (Signed)
Faxed signed Certificate of Medical Necessity form to [phone]

## 2017-07-18 ENCOUNTER — Encounter: Payer: Medicare Other | Admitting: Physician Assistant

## 2017-07-18 DIAGNOSIS — E11622 Type 2 diabetes mellitus with other skin ulcer: Secondary | ICD-10-CM | POA: Diagnosis not present

## 2017-07-19 NOTE — Progress Notes (Signed)
[name] [name], Omara ([id number]) Visit Report for 07/18/2017 Arrival Information Details Patient Name: [name] [name], [name] [name] Date of Service: 07/18/2017 3:15 PM Medical Record Number: [id number] Patient Account Number: [id number] Date of Birth/Sex: [birth year] (84 y.o. Female) Treating RN: Curtis Sitesorthy, Joanna Primary Care Jabier Deese: Barbette ReichmannHande, Vishwanath Other Clinician: Referring Casady Voshell: Barbette ReichmannHande, Vishwanath Treating Ulysess Witz/Extender: Linwood DibblesSTONE III, HOYT Weeks in Treatment: 8 Visit Information History Since Last Visit Added or deleted any medications: No Patient Arrived: Wheel Chair Any new allergies or adverse reactions: No Arrival Time: 15:35 Had a fall or experienced change in No Accompanied By: dtr activities of daily living that may affect Transfer Assistance: None risk of falls: Patient Identification Verified: Yes Signs or symptoms of abuse/neglect since last visito No Secondary Verification Process Yes Hospitalized since last visit: No Completed: Has Dressing in Place as Prescribed: Yes Patient Requires Transmission-Based No Has Compression in Place as Prescribed: Yes Precautions: Pain Present Now: No Patient Has Alerts: Yes Patient Alerts: Patient on Blood Thinner DMII warfarin Electronic Signature(s) Signed: 07/18/2017 5:01:42 PM By: Curtis Sitesorthy, Joanna Entered By: Curtis Sitesorthy, Joanna on 07/18/2017 15:35:38 [name] [name], [name] [name] ([id number]) -------------------------------------------------------------------------------- Clinic Level of Care Assessment Details Patient Name: [name] [name], Mida Date of Service: 07/18/2017 3:15 PM Medical Record Number: [id number] Patient Account Number: [id number] Date of Birth/Sex: [birth year] (84 y.o. Female) Treating RN: Curtis Sitesorthy, Joanna Primary Care Shyan Scalisi: Barbette ReichmannHande, Vishwanath Other Clinician: Referring Lizanne Erker: Barbette ReichmannHande, Vishwanath Treating Lebron Nauert/Extender: Linwood DibblesSTONE III, HOYT Weeks in Treatment: 8 Clinic Level of Care Assessment Items TOOL 4 Quantity Score []  - Use when  only an EandM is performed on FOLLOW-UP visit 0 ASSESSMENTS - Nursing Assessment / Reassessment X - Reassessment of Co-morbidities (includes updates in patient status) 1 10 X- 1 5 Reassessment of Adherence to Treatment Plan ASSESSMENTS - Wound and Skin Assessment / Reassessment X - Simple Wound Assessment / Reassessment - one wound 1 5 []  - 0 Complex Wound Assessment / Reassessment - multiple wounds []  - 0 Dermatologic / Skin Assessment (not related to wound area) ASSESSMENTS - Focused Assessment []  - Circumferential Edema Measurements - multi extremities 0 []  - 0 Nutritional Assessment / Counseling / Intervention X- 1 5 Lower Extremity Assessment (monofilament, tuning fork, pulses) []  - 0 Peripheral Arterial Disease Assessment (using hand held doppler) ASSESSMENTS - Ostomy and/or Continence Assessment and Care []  - Incontinence Assessment and Management 0 []  - 0 Ostomy Care Assessment and Management (repouching, etc.) PROCESS - Coordination of Care X - Simple Patient / Family Education for ongoing care 1 15 []  - 0 Complex (extensive) Patient / Family Education for ongoing care []  - 0 Staff obtains ChiropractorConsents, Records, Test Results / Process Orders []  - 0 Staff telephones HHA, Nursing Homes / Clarify orders / etc []  - 0 Routine Transfer to another Facility (non-emergent condition) []  - 0 Routine Hospital Admission (non-emergent condition) []  - 0 New Admissions / Manufacturing engineernsurance Authorizations / Ordering NPWT, Apligraf, etc. []  - 0 Emergency Hospital Admission (emergent condition) X- 1 10 Simple Discharge Coordination [name], [name] ([id number]) []  - 0 Complex (extensive) Discharge Coordination PROCESS - Special Needs []  - Pediatric / Minor Patient Management 0 []  - 0 Isolation Patient Management []  - 0 Hearing / Language / Visual special needs []  - 0 Assessment of Community assistance (transportation, D/C planning, etc.) []  - 0 Additional assistance / Altered  mentation []  - 0 Support Surface(s) Assessment (bed, cushion, seat, etc.) INTERVENTIONS - Wound Cleansing / Measurement X - Simple Wound Cleansing - one wound 1 5 []  - 0 Complex Wound Cleansing - multiple wounds X- 1 5 Wound  Imaging (photographs - any number of wounds) []  - 0 Wound Tracing (instead of photographs) X- 1 5 Simple Wound Measurement - one wound []  - 0 Complex Wound Measurement - multiple wounds INTERVENTIONS - Wound Dressings X - Small Wound Dressing one or multiple wounds 1 10 []  - 0 Medium Wound Dressing one or multiple wounds []  - 0 Large Wound Dressing one or multiple wounds []  - 0 Application of Medications - topical []  - 0 Application of Medications - injection INTERVENTIONS - Miscellaneous []  - External ear exam 0 []  - 0 Specimen Collection (cultures, biopsies, blood, body fluids, etc.) []  - 0 Specimen(s) / Culture(s) sent or taken to Lab for analysis []  - 0 Patient Transfer (multiple staff / Nurse, adult / Similar devices) []  - 0 Simple Staple / Suture removal (25 or less) []  - 0 Complex Staple / Suture removal (26 or more) []  - 0 Hypo / Hyperglycemic Management (close monitor of Blood Glucose) []  - 0 Ankle / Brachial Index (ABI) - do not check if billed separately X- 1 5 Vital Signs [name], [name] ([id number]) Has the patient been seen at the hospital within the last three years: Yes Total Score: 80 Level Of Care: New/Established - Level 3 Electronic Signature(s) Signed: 07/18/2017 5:01:42 PM By: Curtis Sites Entered By: Curtis Sites on 07/18/2017 16:41:01 [name] [name] ([id number]) -------------------------------------------------------------------------------- Encounter Discharge Information Details Patient Name: [name] [name] Date of Service: 07/18/2017 3:15 PM Medical Record Number: [id number] Patient Account Number: [id number] Date of Birth/Sex: 1925-09-11 (84 y.o. Female) Treating RN: Curtis Sites Primary Care Deadrick Stidd:  Barbette Reichmann Other Clinician: Referring Jemmie Ledgerwood: Barbette Reichmann Treating Phil Michels/Extender: Linwood Dibbles, HOYT Weeks in Treatment: 8 Encounter Discharge Information Items Discharge Pain Level: 0 Discharge Condition: Stable Ambulatory Status: Wheelchair Discharge Destination: Home Transportation: Private Auto Accompanied By: dtr Schedule Follow-up Appointment: Yes Medication Reconciliation completed and No provided to Patient/Care Zamani Crocker: Provided on Clinical Summary of Care: 07/18/2017 Form Type Recipient Paper Patient MS Electronic Signature(s) Signed: 07/18/2017 4:41:53 PM By: Curtis Sites Entered By: Curtis Sites on 07/18/2017 16:41:52 [name] [name] ([id number]) -------------------------------------------------------------------------------- Lower Extremity Assessment Details Patient Name: [name] [name] Date of Service: 07/18/2017 3:15 PM Medical Record Number: [id number] Patient Account Number: [id number] Date of Birth/Sex: Oct 21, 1925 (84 y.o. Female) Treating RN: Curtis Sites Primary Care Katrinna Travieso: Barbette Reichmann Other Clinician: Referring Kynzley Dowson: Barbette Reichmann Treating Joncarlo Friberg/Extender: Linwood Dibbles, HOYT Weeks in Treatment: 8 Edema Assessment Assessed: [Left: No] [Right: No] Edema: [Left: Ye] [Right: s] Vascular Assessment Pulses: Dorsalis Pedis Palpable: [Right:Yes] Posterior Tibial Extremity colors, hair growth, and conditions: Extremity Color: [Right:Hyperpigmented] Hair Growth on Extremity: [Right:No] Temperature of Extremity: [Right:Warm] Capillary Refill: [Right:< 3 seconds] Electronic Signature(s) Signed: 07/18/2017 5:01:42 PM By: Curtis Sites Entered By: Curtis Sites on 07/18/2017 15:43:18 [name] [name] ([id number]) -------------------------------------------------------------------------------- Multi Wound Chart Details Patient Name: [name] [name] Date of Service: 07/18/2017 3:15 PM Medical Record Number:  [id number] Patient Account Number: [id number] Date of Birth/Sex: 1926-01-05 (84 y.o. 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 Secondary Etiology: Lymphedema Lymphedema N/A Comorbid History: N/A Cataracts, Anemia, N/A Arrhythmia, Congestive Heart Failure, Hypertension, Type II Diabetes Date Acquired:  12/28/2016 07/08/2017 N/A Weeks of Treatment: 8 1 N/A Wound Status: Healed - Epithelialized Open N/A Measurements L x W x D 0x0x0 1.2x1.1x0.1 N/A (cm) Area (cm) : 0 1.037 N/A Volume (cm) : 0 0.104 N/A % Reduction in Area: 100.00% -266.40% N/A % Reduction in Volume: 100.00% -271.40% N/A Classification: Grade 1 Grade 1 N/A Exudate Amount: N/A Large N/A Exudate Type: N/A Serous N/A Exudate Color: N/A amber N/A Wound Margin: N/A Flat and Intact N/A Granulation Amount: N/A Large (67-100%) N/A Granulation Quality: N/A Pink N/A Necrotic Amount: N/A Small (1-33%) N/A Epithelialization: N/A Small (1-33%) N/A Periwound Skin Texture: No Abnormalities Noted Excoriation: No N/A Induration: No [name] [name], [name] [name] ([id number]) Callus: No Crepitus: No Rash: No Scarring: No Periwound Skin Moisture: No Abnormalities Noted Maceration: No N/A Dry/Scaly: No Periwound Skin Color: No Abnormalities Noted Atrophie Blanche: No N/A Cyanosis: No Ecchymosis: No Erythema: No Hemosiderin Staining: No Mottled: No Pallor: No Rubor:  No Temperature: N/A No Abnormality N/A Tenderness on Palpation: No Yes N/A Wound Preparation: N/A Ulcer Cleansing: N/A Rinsed/Irrigated with Saline Topical Anesthetic Applied: Other: lidocaine 4% Treatment Notes Electronic Signature(s) Signed: 07/18/2017 5:01:42 PM By: Curtis Sites Entered By: Curtis Sites on 07/18/2017 15:57:15 [name] [name] ([id number]) -------------------------------------------------------------------------------- Multi-Disciplinary Care Plan Details Patient Name: [name] [name] Date of Service: 07/18/2017 3:15 PM Medical Record Number: [id number] Patient Account Number: [id number] Date of Birth/Sex: 11/19/1925 (84 y.o. Female) Treating RN: Curtis Sites Primary Care Aviah Sorci: Barbette Reichmann Other Clinician: Referring Khalifa Knecht: Barbette Reichmann Treating Urvi Imes/Extender: Linwood Dibbles, HOYT Weeks in Treatment: 8 Active Inactive ` Abuse / Safety / Falls / Self Care Management Nursing Diagnoses: Impaired physical mobility Goals: Patient will not experience any injury related to falls Date Initiated: 05/19/2017 Target Resolution Date: 07/30/2017 Goal Status: Active Interventions: Assess fall risk on admission and as needed Notes: ` Orientation to the Wound Care Program Nursing Diagnoses: Knowledge deficit related to the wound healing center program Goals: Patient/caregiver will verbalize understanding of the Wound Healing Center Program Date Initiated: 05/19/2017 Target Resolution Date: 07/30/2017 Goal Status: Active Interventions: Provide education on orientation to the wound center Notes: ` Wound/Skin Impairment Nursing Diagnoses: Impaired tissue integrity Goals: Ulcer/skin breakdown will heal within 14 weeks Date Initiated: 05/19/2017 Target Resolution Date: 07/30/2017 Goal Status: Active Interventions: [name] [name], [name] [name] ([id number]) Assess patient/caregiver ability to obtain necessary supplies Assess patient/caregiver ability to perform  ulcer/skin care regimen upon admission and as needed Assess ulceration(s) every visit Notes: Electronic Signature(s) Signed: 07/18/2017 5:01:42 PM By: Curtis Sites Entered By: Curtis Sites on 07/18/2017 15:57:00 [name] [name] ([id number]) -------------------------------------------------------------------------------- Pain Assessment Details Patient Name: [name] [name] Date of Service: 07/18/2017 3:15 PM Medical Record Number: [id number] Patient Account Number: [id number] Date of Birth/Sex: 1926-04-23 (84 y.o. Female) Treating RN: Curtis Sites Primary Care Charlynn Salih: Barbette Reichmann Other Clinician: Referring Carneshia Raker: Barbette Reichmann Treating Kamilo Och/Extender: Linwood Dibbles, HOYT Weeks in Treatment: 8 Active Problems Location of Pain Severity and Description of Pain Patient Has Paino No Site Locations Pain Management and Medication Current Pain Management: Electronic Signature(s) Signed: 07/18/2017 5:01:42 PM By: Curtis Sites Entered By: Curtis Sites on 07/18/2017 15:37:07 [name] [name] ([id number]) -------------------------------------------------------------------------------- Patient/Caregiver Education Details Patient Name: [name] [name] Date of Service: 07/18/2017 3:15 PM Medical Record Number: [id number] Patient Account Number: [id number] Date of Birth/Gender: Aug 20, 1925 (84 y.o. Female) Treating RN: Curtis Sites Primary Care Physician: Barbette Reichmann Other Clinician: Referring Physician: Barbette Reichmann Treating Physician/Extender: Skeet Simmer in Treatment: 8 Education Assessment Education Provided To: Patient and Caregiver Education Topics Provided Venous: Handouts: Other: wound care as ordered Methods:  Explain/Verbal Responses: State content correctly Electronic Signature(s) Signed: 07/18/2017 5:01:42 PM By: Curtis Sites Entered By: Curtis Sites on 07/18/2017 16:42:14 [name] [name]  ([id number]) -------------------------------------------------------------------------------- Wound Assessment Details Patient Name: [name] [name] Date of Service: 07/18/2017 3:15 PM Medical Record Number: [id number] Patient Account Number: [id number] Date of Birth/Sex: April 18, 1926 (84 y.o. Female) Treating RN: Curtis Sites Primary Care Clarissia Mckeen: Barbette Reichmann Other Clinician: Referring Elester Apodaca: Barbette Reichmann Treating Priyanka Causey/Extender: Linwood Dibbles, HOYT Weeks in Treatment: 8 Wound Status Wound Number: 1 Primary Etiology: Diabetic Wound/Ulcer of the Lower Extremity Wound Location: Left, Posterior Lower Leg Secondary Lymphedema Wounding Event: Blister Etiology: Date Acquired: 12/28/2016 Wound Status: Healed - Epithelialized Weeks Of Treatment: 8 Clustered Wound: No Photos Photo Uploaded By: Curtis Sites on 07/18/2017 15:51:17 Wound Measurements Length: (cm) 0 % Width: (cm) 0 % Depth: (cm) 0 Area: (cm) 0 Volume: (cm) 0 Reduction in Area: 100% Reduction in Volume: 100% Wound Description Classification: Grade 1 Periwound Skin Texture Texture Color No Abnormalities Noted: No No Abnormalities Noted: No Moisture No Abnormalities Noted: No Electronic Signature(s) Signed: 07/18/2017 5:01:42 PM By: Curtis Sites Entered By: Curtis Sites on 07/18/2017 15:42:49 [name] [name] ([id number]) -------------------------------------------------------------------------------- Wound Assessment Details Patient Name: [name] [name] Date of Service: 07/18/2017 3:15 PM Medical Record Number: [id number] Patient Account Number: [id number] Date of Birth/Sex: Apr 13, 1926 (84 y.o. Female) Treating RN: Curtis Sites Primary Care Denorris Reust: Barbette Reichmann Other Clinician: Referring Jazzmen Restivo: Barbette Reichmann Treating Melondy Blanchard/Extender: Linwood Dibbles, HOYT Weeks in Treatment: 8 Wound Status Wound Number: 2 Primary Diabetic Wound/Ulcer of the Lower Extremity Etiology: Wound  Location: Right Lower Leg - Lateral Secondary Lymphedema Wounding Event: Blister Etiology: Date Acquired: 07/08/2017 Wound Status: Open Weeks Of Treatment: 1 Comorbid Cataracts, Anemia, Arrhythmia, Congestive Clustered Wound: No History: Heart Failure, Hypertension, Type II Diabetes Photos Photo Uploaded By: Curtis Sites on 07/18/2017 15:51:18 Wound Measurements Length: (cm) 1.2 Width: (cm) 1.1 Depth: (cm) 0.1 Area: (cm) 1.037 Volume: (cm) 0.104 % Reduction in Area: -266.4% % Reduction in Volume: -271.4% Epithelialization: Small (1-33%) Tunneling: No Undermining: No Wound Description Classification: Grade 1 Wound Margin: Flat and Intact Exudate Amount: Large Exudate Type: Serous Exudate Color: amber Foul Odor After Cleansing: No Slough/Fibrino Yes Wound Bed Granulation Amount: Large (67-100%) Exposed Structure Granulation Quality: Pink Fascia Exposed: No Necrotic Amount: Small (1-33%) Fat Layer (Subcutaneous Tissue) Exposed: No Necrotic Quality: Adherent Slough Tendon Exposed: No Muscle Exposed: No Joint Exposed: No Bone Exposed: No Periwound Skin Texture [name] [name], [name] [name] ([id number]) Texture Color No Abnormalities Noted: No No Abnormalities Noted: No Callus: No Atrophie Blanche: No Crepitus: No Cyanosis: No Excoriation: No Ecchymosis: No Induration: No Erythema: No Rash: No Hemosiderin Staining: No Scarring: No Mottled: No Pallor: No Moisture Rubor: No No Abnormalities Noted: No Dry / Scaly: No Temperature / Pain Maceration: No Temperature: No Abnormality Tenderness on Palpation: Yes Wound Preparation Ulcer Cleansing: Rinsed/Irrigated with Saline Topical Anesthetic Applied: Other: lidocaine 4%, Treatment Notes Wound #2 (Right, Lateral Lower Leg) 1. Cleansed with: Clean wound with Normal Saline 4. Dressing Applied: Prisma Ag 5. Secondary Dressing Applied Dry Gauze Non-Adherent pad Notes coban lightly to secure Electronic  Signature(s) Signed: 07/18/2017 5:01:42 PM By: Curtis Sites Entered By: Curtis Sites on 07/18/2017 15:43:03 [name] [name] ([id number]) -------------------------------------------------------------------------------- Vitals Details Patient Name: [name] [name] Date of Service: 07/18/2017 3:15 PM Medical Record Number: [id number] Patient Account Number: [id number] Date of Birth/Sex: 03-Mar-1926 (84 y.o. Female) Treating RN: Curtis Sites Primary Care Dekisha Mesmer: Barbette Reichmann Other Clinician: Referring Marly Schuld: Barbette Reichmann Treating Fidela Cieslak/Extender: STONE III, HOYT Weeks in Treatment: 8 Vital Signs Time Taken:  15:37 Pulse (bpm): 67 Height (in): 63 Respiratory Rate (breaths/min): 20 Weight (lbs): 212 Blood Pressure (mmHg): 120/58 Body Mass Index (BMI): 37.6 Reference Range: 80 - 120 mg / dl Electronic Signature(s) Signed: 07/18/2017 5:01:42 PM By: Curtis Sites Entered By: Curtis Sites on 07/18/2017 15:42:34

## 2017-07-19 NOTE — Progress Notes (Signed)
[name] [name], [name] [name] ([id number]) Visit Report for 07/18/2017 Chief Complaint Document Details Patient Name: [name] [name], [name] [name] Date of Service: 07/18/2017 3:15 PM Medical Record Number: [id number] Patient Account Number: [id number] Date of Birth/Sex: 04/21/1926 (84 y.o. Female) Treating RN: Curtis Sites Primary Care Provider: Barbette Reichmann Other Clinician: Referring Provider: Barbette Reichmann Treating Provider/Extender: Linwood Dibbles, Bridie Colquhoun Weeks in Treatment: 8 Information Obtained from: Patient Chief Complaint Patient presents to the wound care center for a consult due non healing wound to the left lower extremity with bilateral massive swelling of the legs Electronic Signature(s) Signed: 07/19/2017 8:22:01 AM By: Lenda Kelp PA-C Entered By: Lenda Kelp on 07/18/2017 15:44:15 [name] [name] ([id number]) -------------------------------------------------------------------------------- HPI Details Patient Name: [name] [name] Date of Service: 07/18/2017 3:15 PM Medical Record Number: [id number] Patient Account Number: [id number] Date of Birth/Sex: Jan 28, 1926 (84 y.o. Female) Treating RN: Curtis Sites Primary Care Provider: Barbette Reichmann Other Clinician: Referring Provider: Barbette Reichmann Treating Provider/Extender: Linwood Dibbles, Danisa Kopec Weeks in Treatment: 8 History of Present Illness HPI Description: 84 year old patient seen by her PCP and referred to our center for left lower extremity stasis ulceration. She is known to have diabetes mellitus type 2, hypertension,status post cholecystectomy and open heart surgery, and atrial fibrillation and has bilateral lower extremity lymphedema with oozing of fluid. Her most recent hemoglobin A1c was 7.2. after assessment by the PCP she was put on torsemide 20 mg daily, echo was noted to have an ejection fraction of more than 55% with moderate MR, and the patient was maintained on warfarin for atrial fibrillation. the patient has been  living in [place] for about a year but has been traveling a bit and has not had any arterial or venous studies done recently. Her last echo was about a year and a half ago. 05/27/2017 -- she has not had any dressing changes since the last time she was here as a home health did not turn up. Her arterial duplex study is scheduled for next week. 06/03/2017 -- -- had a lower extremity arterial study done and there was arterial wall calcification and the resting ABI was noncompressible bilaterally. The right and left toe brachial indices were abnormal with the left first toe pressure being 76 mmHg and the right toe pressure was 89 mmHg. The right toe brachial index was 0.55 and the left toe brachial index was 0.47. 07/04/17 on evaluation today patient appears to be doing well in regard to her left lower chimney ulcer. She has been tolerating the current treatment plan without any complication. Unfortunately they did get compression hose delivered that were 30-40 mmHg but unfortunately she is unable to wear these. Her daughter was able to find 18 mmHg compression socks at the drugstore which she has been able to tolerate without any complication. This seems like it may be a better option for her. It's not as much compression as I would like but also it's something she's actually able to wear and something is going to be better than nothing. 07/11/17 on evaluation today patient appears to be doing fairly well regard to her left lower extremity ulcer. Unfortunately she does have a new blister on the right lower extremity which has opened at this point. Fortunately there does not appear to be any evidence of infection which is good news. With that being said one of the bigger issues today is that she seems to be having quite a bit of difficulty breathing she has a lot of audible rhonchi even before I listen to her with the stethoscope.  No fevers, chills, nausea, or vomiting noted at this time. She does  seem to be feeling very poorly according to her daughter she's also not walking and she normally would presumably this is due to the fact that she is short of breath. 07/18/17 on evaluation today patient's bilateral lower extremities do appear to be doing better at this point in time. The left lower extremity ulcer has healed she does still have swelling bilaterally and she has the right opening that we first noted last week that is still open at this point. There is fluid leaking from the site fortunately it does not appear to be too deep. No fevers, chills, nausea, or vomiting noted at this time. Electronic Signature(s) Signed: 07/19/2017 8:22:01 AM By: Lenda Kelp PA-C Entered By: Lenda Kelp on 07/18/2017 16:01:53 [name] [name] ([id number]) -------------------------------------------------------------------------------- Physical Exam Details Patient Name: [name] [name] Date of Service: 07/18/2017 3:15 PM Medical Record Number: [id number] Patient Account Number: [id number] Date of Birth/Sex: 1925-07-22 (84 y.o. Female) Treating RN: Curtis Sites Primary Care Provider: Barbette Reichmann Other Clinician: Referring Provider: Barbette Reichmann Treating Provider/Extender: STONE III, Mozetta Murfin Weeks in Treatment: 8 Constitutional Well-nourished and well-hydrated in no acute distress. Respiratory normal breathing without difficulty. clear to auscultation bilaterally. Cardiovascular regular rate and rhythm with normal S1, S2. 2+ pitting edema of the bilateral lower extremities. Psychiatric this patient is able to make decisions and demonstrates good insight into disease process. Alert and Oriented x 3. pleasant and cooperative. Notes At this point patient's wound bed on the right lower extremity appears to show signs of good granulation. There was some Slough on the surface this is easily wiped off with saline and gauze. She continues to have significant fluid buildup of the bilateral  lower extremities. Fortunately her left wound has healed. Electronic Signature(s) Signed: 07/19/2017 8:22:01 AM By: Lenda Kelp PA-C Entered By: Lenda Kelp on 07/18/2017 16:02:46 [name] [name] ([id number]) -------------------------------------------------------------------------------- Physician Orders Details Patient Name: [name] [name] Date of Service: 07/18/2017 3:15 PM Medical Record Number: [id number] Patient Account Number: [id number] Date of Birth/Sex: 08-30-1925 (84 y.o. Female) Treating RN: Curtis Sites Primary Care Provider: Barbette Reichmann Other Clinician: Referring Provider: Barbette Reichmann Treating Provider/Extender: Linwood Dibbles, Trustin Chapa Weeks in Treatment: 8 Verbal / Phone Orders: No Diagnosis Coding ICD-10 Coding Code Description E11.622 Type 2 diabetes mellitus with other skin ulcer I89.0 Lymphedema, not elsewhere classified L97.222 Non-pressure chronic ulcer of left calf with fat layer exposed L97.212 Non-pressure chronic ulcer of right calf with fat layer exposed I50.22 Chronic systolic (congestive) heart failure N18.3 Chronic kidney disease, stage 3 (moderate) Primary Wound Dressing Wound #2 Right,Lateral Lower Leg o Prisma Ag Secondary Dressing Wound #2 Right,Lateral Lower Leg o Non-adherent pad - secure lightly with coban - DO NOT WRAP COBAN TIGHTLY o Drawtex Dressing Change Frequency Wound #2 Right,Lateral Lower Leg o Change dressing every other day. Follow-up Appointments Wound #2 Right,Lateral Lower Leg o Return Appointment in 1 week. Edema Control Wound #2 Right,Lateral Lower Leg o Patient to wear own compression stockings o Elevate legs to the level of the heart and pump ankles as often as possible Additional Orders / Instructions Wound #2 Right,Lateral Lower Leg o Increase protein intake. o Other: - Please try to keep blood sugars below 180. Please add over the counter vitamin C, multivitamin and zinc supplements  to your diet. Home Health Wound #2 Right,Lateral Lower Leg o Continue Home Health Visits - LAYLANIE, KRUCZEK ([id number]) o Home Health Nurse may visit PRN to address patientos wound  care needs. o FACE TO FACE ENCOUNTER: MEDICARE and MEDICAID PATIENTS: I certify that this patient is under my care and that I had a face-to-face encounter that meets the physician face-to-face encounter requirements with this patient on this date. The encounter with the patient was in whole or in part for the following MEDICAL CONDITION: (primary reason for Home Healthcare) MEDICAL NECESSITY: I certify, that based on my findings, NURSING services are a medically necessary home health service. HOME BOUND STATUS: I certify that my clinical findings support that this patient is homebound (i.e., Due to illness or injury, pt requires aid of supportive devices such as crutches, cane, wheelchairs, walkers, the use of special transportation or the assistance of another person to leave their place of residence. There is a normal inability to leave the home and doing so requires considerable and taxing effort. Other absences are for medical reasons / religious services and are infrequent or of short duration when for other reasons). o If current dressing causes regression in wound condition, may D/C ordered dressing product/s and apply Normal Saline Moist Dressing daily until next Wound Healing Center / Other MD appointment. Notify Wound Healing Center of regression in wound condition at [phone]. o Please direct any NON-WOUND related issues/requests for orders to patient's Primary Care Physician Electronic Signature(s) Signed: 07/18/2017 5:01:42 PM By: Curtis Sites Signed: 07/19/2017 8:22:01 AM By: Lenda Kelp PA-C Entered By: Curtis Sites on 07/18/2017 15:58:08 [name] [name] ([id number]) -------------------------------------------------------------------------------- Problem List  Details Patient Name: [name] [name] Date of Service: 07/18/2017 3:15 PM Medical Record Number: [id number] Patient Account Number: [id number] Date of Birth/Sex: 01/02/1926 (84 y.o. Female) Treating RN: Curtis Sites Primary Care Provider: Barbette Reichmann Other Clinician: Referring Provider: Barbette Reichmann Treating Provider/Extender: Linwood Dibbles, Camran Keady Weeks in Treatment: 8 Active Problems ICD-10 Encounter Code Description Active Date Diagnosis E11.622 Type 2 diabetes mellitus with other skin ulcer 05/19/2017 Yes I89.0 Lymphedema, not elsewhere classified 05/19/2017 Yes L97.222 Non-pressure chronic ulcer of left calf with fat layer exposed 05/19/2017 Yes L97.212 Non-pressure chronic ulcer of right calf with fat layer exposed 07/11/2017 Yes I50.22 Chronic systolic (congestive) heart failure 05/19/2017 Yes N18.3 Chronic kidney disease, stage 3 (moderate) 05/19/2017 Yes Inactive Problems Resolved Problems Electronic Signature(s) Signed: 07/19/2017 8:22:01 AM By: Lenda Kelp PA-C Entered By: Lenda Kelp on 07/18/2017 15:44:02 [name] [name], [name] [name] ([id number]) -------------------------------------------------------------------------------- Progress Note Details Patient Name: [name] [name] Date of Service: 07/18/2017 3:15 PM Medical Record Number: [id number] Patient Account Number: [id number] Date of Birth/Sex: [birth year] (84 y.o. Female) Treating RN: Curtis Sites Primary Care Provider: Barbette Reichmann Other Clinician: Referring Provider: Barbette Reichmann Treating Provider/Extender: Linwood Dibbles, Jett Kulzer Weeks in Treatment: 8 Subjective Chief Complaint Information obtained from Patient Patient presents to the wound care center for a consult due non healing wound to the left lower extremity with bilateral massive swelling of the legs History of Present Illness (HPI) 84 year old patient seen by her PCP and referred to our center for left lower extremity stasis ulceration. She is  known to have diabetes mellitus type 2, hypertension,status post cholecystectomy and open heart surgery, and atrial fibrillation and has bilateral lower extremity lymphedema with oozing of fluid. Her most recent hemoglobin A1c was 7.2. after assessment by the PCP she was put on torsemide 20 mg daily, echo was noted to have an ejection fraction of more than 55% with moderate MR, and the patient was maintained on warfarin for atrial fibrillation. the patient has been living in [place] for about a year but has been traveling a bit  and has not had any arterial or venous studies done recently. Her last echo was about a year and a half ago. 05/27/2017 -- she has not had any dressing changes since the last time she was here as a home health did not turn up. Her arterial duplex study is scheduled for next week. 06/03/2017 -- -- had a lower extremity arterial study done and there was arterial wall calcification and the resting ABI was noncompressible bilaterally. The right and left toe brachial indices were abnormal with the left first toe pressure being 76 mmHg and the right toe pressure was 89 mmHg. The right toe brachial index was 0.55 and the left toe brachial index was 0.47. 07/04/17 on evaluation today patient appears to be doing well in regard to her left lower chimney ulcer. She has been tolerating the current treatment plan without any complication. Unfortunately they did get compression hose delivered that were 30-40 mmHg but unfortunately she is unable to wear these. Her daughter was able to find 18 mmHg compression socks at the drugstore which she has been able to tolerate without any complication. This seems like it may be a better option for her. It's not as much compression as I would like but also it's something she's actually able to wear and something is going to be better than nothing. 07/11/17 on evaluation today patient appears to be doing fairly well regard to her left lower  extremity ulcer. Unfortunately she does have a new blister on the right lower extremity which has opened at this point. Fortunately there does not appear to be any evidence of infection which is good news. With that being said one of the bigger issues today is that she seems to be having quite a bit of difficulty breathing she has a lot of audible rhonchi even before I listen to her with the stethoscope. No fevers, chills, nausea, or vomiting noted at this time. She does seem to be feeling very poorly according to her daughter she's also not walking and she normally would presumably this is due to the fact that she is short of breath. 07/18/17 on evaluation today patient's bilateral lower extremities do appear to be doing better at this point in time. The left lower extremity ulcer has healed she does still have swelling bilaterally and she has the right opening that we first noted last week that is still open at this point. There is fluid leaking from the site fortunately it does not appear to be too deep. No fevers, chills, nausea, or vomiting noted at this time. Patient History Information obtained from Patient. [name] [name], [name] [name] ([id number]) Family History Cancer - Child, Heart Disease - Mother, No family history of Diabetes, Hereditary Spherocytosis, Hypertension, Kidney Disease, Lung Disease, Seizures, Stroke, Thyroid Problems, Tuberculosis. Social History Former smoker - quit 60 years ago, Marital Status - Widowed, Alcohol Use - Never, Drug Use - No History, Caffeine Use - Daily. Medical And Surgical History Notes Eyes macular degeneration Review of Systems (ROS) Constitutional Symptoms (General Health) Denies complaints or symptoms of Fever, Chills. Respiratory The patient has no complaints or symptoms. Cardiovascular Complains or has symptoms of LE edema. Psychiatric The patient has no complaints or symptoms. Objective Constitutional Well-nourished and well-hydrated in no  acute distress. Vitals Time Taken: 3:37 PM, Height: 63 in, Weight: 212 lbs, BMI: 37.6, Pulse: 67 bpm, Respiratory Rate: 20 breaths/min, Blood Pressure: 120/58 mmHg. Respiratory normal breathing without difficulty. clear to auscultation bilaterally. Cardiovascular regular rate and rhythm with normal S1, S2. 2+  pitting edema of the bilateral lower extremities. Psychiatric this patient is able to make decisions and demonstrates good insight into disease process. Alert and Oriented x 3. pleasant and cooperative. General Notes: At this point patient's wound bed on the right lower extremity appears to show signs of good granulation. There was some Slough on the surface this is easily wiped off with saline and gauze. She continues to have significant fluid buildup of the bilateral lower extremities. Fortunately her left wound has healed. Integumentary (Hair, Skin) Wound #1 status is Healed - Epithelialized. Original cause of wound was Blister. The wound is located on the Left,Posterior Lower Leg. The wound measures 0cm length x 0cm width x 0cm depth; 0cm^2 area and 0cm^3 volume. [name] [name], [name] [name] ([id number]) Wound #2 status is Open. Original cause of wound was Blister. The wound is located on the Right,Lateral Lower Leg. The wound measures 1.2cm length x 1.1cm width x 0.1cm depth; 1.037cm^2 area and 0.104cm^3 volume. There is no tunneling or undermining noted. There is a large amount of serous drainage noted. The wound margin is flat and intact. There is large (67-100%) pink granulation within the wound bed. There is a small (1-33%) amount of necrotic tissue within the wound bed including Adherent Slough. The periwound skin appearance did not exhibit: Callus, Crepitus, Excoriation, Induration, Rash, Scarring, Dry/Scaly, Maceration, Atrophie Blanche, Cyanosis, Ecchymosis, Hemosiderin Staining, Mottled, Pallor, Rubor, Erythema. Periwound temperature was noted as No Abnormality. The periwound has  tenderness on palpation. Assessment Active Problems ICD-10 E11.622 - Type 2 diabetes mellitus with other skin ulcer I89.0 - Lymphedema, not elsewhere classified L97.222 - Non-pressure chronic ulcer of left calf with fat layer exposed L97.212 - Non-pressure chronic ulcer of right calf with fat layer exposed I50.22 - Chronic systolic (congestive) heart failure N18.3 - Chronic kidney disease, stage 3 (moderate) Plan Primary Wound Dressing: Wound #2 Right,Lateral Lower Leg: Prisma Ag Secondary Dressing: Wound #2 Right,Lateral Lower Leg: Non-adherent pad - secure lightly with coban - DO NOT WRAP COBAN TIGHTLY Drawtex Dressing Change Frequency: Wound #2 Right,Lateral Lower Leg: Change dressing every other day. Follow-up Appointments: Wound #2 Right,Lateral Lower Leg: Return Appointment in 1 week. Edema Control: Wound #2 Right,Lateral Lower Leg: Patient to wear own compression stockings Elevate legs to the level of the heart and pump ankles as often as possible Additional Orders / Instructions: Wound #2 Right,Lateral Lower Leg: Increase protein intake. Other: - Please try to keep blood sugars below 180. Please add over the counter vitamin C, multivitamin and zinc supplements to your diet. Home Health: Wound #2 Right,Lateral Lower Leg: Continue Home Health Visits - [facility] Nurse may visit PRN to address patient s wound care needs. FACE TO FACE ENCOUNTER: MEDICARE and MEDICAID PATIENTS: I certify that this patient is under my care and that I had a face-to-face encounter that meets the physician face-to-face encounter requirements with this patient on this date. The [name] [name], [name] [name] ([id number]) encounter with the patient was in whole or in part for the following MEDICAL CONDITION: (primary reason for Home Healthcare) MEDICAL NECESSITY: I certify, that based on my findings, NURSING services are a medically necessary home health service. HOME BOUND STATUS: I certify that my  clinical findings support that this patient is homebound (i.e., Due to illness or injury, pt requires aid of supportive devices such as crutches, cane, wheelchairs, walkers, the use of special transportation or the assistance of another person to leave their place of residence. There is a normal inability to leave the home and doing so  requires considerable and taxing effort. Other absences are for medical reasons / religious services and are infrequent or of short duration when for other reasons). If current dressing causes regression in wound condition, may D/C ordered dressing product/s and apply Normal Saline Moist Dressing daily until next Wound Healing Center / Other MD appointment. Notify Wound Healing Center of regression in wound condition at [phone]. Please direct any NON-WOUND related issues/requests for orders to patient's Primary Care Physician I am going to recommend that we continue with the Current wound care measures for the next week. Patient is in agreement with this plan. We will see were things stand in one weeks time. If anything worsens significantly she will contact our office for additional recommendations or have her daughter do so. Otherwise hopefully she will continue to show signs of improvement she is going to initiate treatment with her home compression stocking on the left we will wrap the right. Electronic Signature(s) Signed: 07/19/2017 8:22:01 AM By: Lenda Kelp PA-C Entered By: Lenda Kelp on 07/18/2017 16:03:28 [name] [name], [name] [name] ([id number]) -------------------------------------------------------------------------------- ROS/PFSH Details Patient Name: [name] [name] Date of Service: 07/18/2017 3:15 PM Medical Record Number: [id number] Patient Account Number: [id number] Date of Birth/Sex: [birth year] (84 y.o. Female) Treating RN: Curtis Sites Primary Care Provider: Barbette Reichmann Other Clinician: Referring Provider: Barbette Reichmann Treating  Provider/Extender: Linwood Dibbles, Lazara Grieser Weeks in Treatment: 8 Information Obtained From Patient Wound History Do you currently have one or more open woundso Yes How many open wounds do you currently haveo 2 Approximately how long have you had your woundso 5 months How have you been treating your wound(s) until nowo camphil Has your wound(s) ever healed and then re-openedo No Have you had any lab work done in the past montho No Have you tested positive for an antibiotic resistant organism (MRSA, VRE)o No Have you tested positive for osteomyelitis (bone infection)o No Have you had any tests for circulation on your legso No Have you had other problems associated with your woundso Swelling Constitutional Symptoms (General Health) Complaints and Symptoms: Negative for: Fever; Chills Cardiovascular Complaints and Symptoms: Positive for: LE edema Medical History: Positive for: Arrhythmia - a fib; Congestive Heart Failure; Hypertension Negative for: Angina; Coronary Artery Disease; Deep Vein Thrombosis; Hypotension; Myocardial Infarction; Peripheral Arterial Disease; Peripheral Venous Disease; Phlebitis; Vasculitis Eyes Medical History: Positive for: Cataracts - removed Past Medical History Notes: macular degeneration Hematologic/Lymphatic Medical History: Positive for: Anemia Negative for: Hemophilia; Human Immunodeficiency Virus; Lymphedema; Sickle Cell Disease Respiratory Complaints and Symptoms: No Complaints or Symptoms Medical History: Negative for: Aspiration; Asthma; Chronic Obstructive Pulmonary Disease (COPD); Pneumothorax; Sleep Apnea; PIXIE, BURGENER ([id number]) Tuberculosis Gastrointestinal Medical History: Negative for: Cirrhosis ; Colitis; Crohnos; Hepatitis A; Hepatitis B; Hepatitis C Endocrine Medical History: Positive for: Type II Diabetes Treated with: Insulin Immunological Medical History: Negative for: Lupus Erythematosus; Raynaudos;  Scleroderma Musculoskeletal Medical History: Negative for: Gout; Rheumatoid Arthritis; Osteoarthritis; Osteomyelitis Neurologic Medical History: Negative for: Dementia; Neuropathy Oncologic Medical History: Negative for: Received Chemotherapy; Received Radiation Psychiatric Complaints and Symptoms: No Complaints or Symptoms HBO Extended History Items Eyes: Cataracts Immunizations Pneumococcal Vaccine: Received Pneumococcal Vaccination: Yes Immunization Notes: up to date Implantable Devices Family and Social History Cancer: Yes - Child; Diabetes: No; Heart Disease: Yes - Mother; Hereditary Spherocytosis: No; Hypertension: No; Kidney Disease: No; Lung Disease: No; Seizures: No; Stroke: No; Thyroid Problems: No; Tuberculosis: No; Former smoker - quit 60 years ago; Marital Status - Widowed; Alcohol Use: Never; Drug Use: No History; Caffeine Use: Daily; Financial Concerns: No; Food,  Clothing or Shelter Needs: No; Support System Lacking: No; Transportation Concerns: No; Advanced Directives: No; Patient does not want information on Advanced Directives Physician [name] [name], [name] [name] ([id number]) I have reviewed and agree with the above information. Electronic Signature(s) Signed: 07/18/2017 5:01:42 PM By: Curtis Sitesorthy, Joanna Signed: 07/19/2017 8:22:01 AM By: Lenda KelpStone III, Twan Harkin PA-C Entered By: Lenda KelpStone III, Bram Hottel on 07/18/2017 16:02:16 [name] [name], [name] [name] ([id number]) -------------------------------------------------------------------------------- SuperBill Details Patient Name: [name] [name], Mariea Date of Service: 07/18/2017 Medical Record Number: [id number] Patient Account Number: [id number] Date of Birth/Sex: 1925-06-29 (84 y.o. Female) Treating RN: Curtis Sitesorthy, Joanna Primary Care Provider: Barbette ReichmannHande, Vishwanath Other Clinician: Referring Provider: Barbette ReichmannHande, Vishwanath Treating Provider/Extender: Linwood DibblesSTONE III, Jayson Waterhouse Weeks in Treatment: 8 Diagnosis Coding ICD-10 Codes Code Description E11.622 Type 2  diabetes mellitus with other skin ulcer I89.0 Lymphedema, not elsewhere classified L97.222 Non-pressure chronic ulcer of left calf with fat layer exposed L97.212 Non-pressure chronic ulcer of right calf with fat layer exposed I50.22 Chronic systolic (congestive) heart failure N18.3 Chronic kidney disease, stage 3 (moderate) Facility Procedures CPT4 Code: 0865784676100138 Description: 99213 - WOUND CARE VISIT-LEV 3 EST PT Modifier: Quantity: 1 Physician Procedures CPT4 Code: 96295286770416 Description: 99213 - WC PHYS LEVEL 3 - EST PT ICD-10 Diagnosis Description E11.622 Type 2 diabetes mellitus with other skin ulcer I89.0 Lymphedema, not elsewhere classified L97.222 Non-pressure chronic ulcer of left calf with fat layer expo L97.212  Non-pressure chronic ulcer of right calf with fat layer exp Modifier: sed osed Quantity: 1 Electronic Signature(s) Signed: 07/18/2017 4:41:14 PM By: Curtis Sitesorthy, Joanna Signed: 07/19/2017 8:22:01 AM By: Lenda KelpStone III, Keiondre Colee PA-C Entered By: Curtis Sitesorthy, Joanna on 07/18/2017 16:41:14

## 2017-07-23 IMAGING — CT CT CHEST W/O CM
2 of 3 series · 15 of 36 positions shown, 18 images · non-contrast
Comparison: Chest radiograph May 04, 2016

CLINICAL DATA: Shortness of Breath

EXAM:
CT CHEST WITHOUT CONTRAST
TECHNIQUE: Multidetector CT imaging of the chest was performed following the
standard protocol without IV contrast.

[Series 2: thorax · axial · 0.67mm/px · z∈[+953,+1201]mm · 12 of 146 slices shown, 15 images]
[im 11/146  mediastinal]
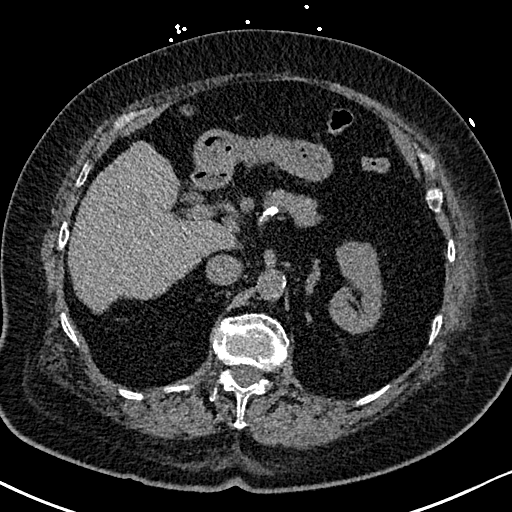
[im 11/146  lung]
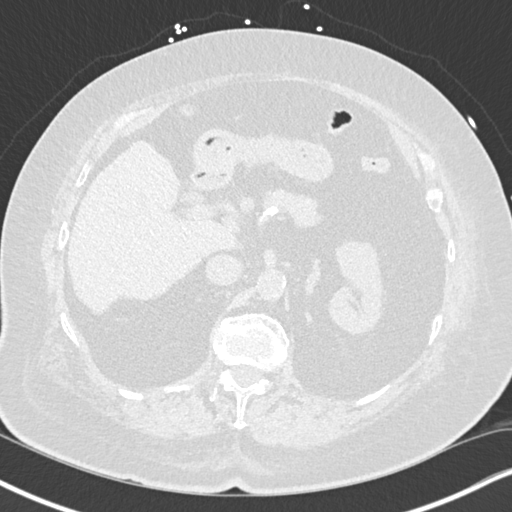
[im 22/146  lung]
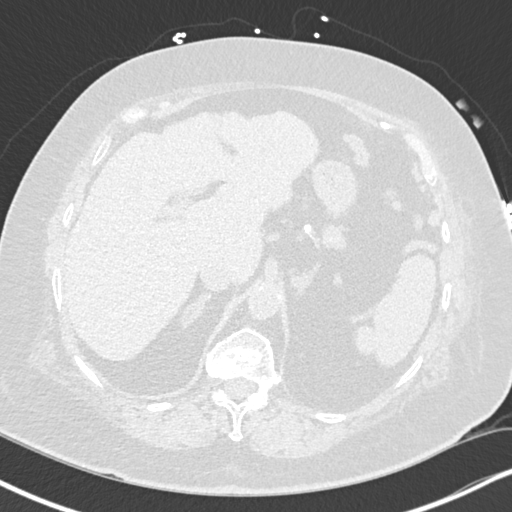
[im 33/146  lung]
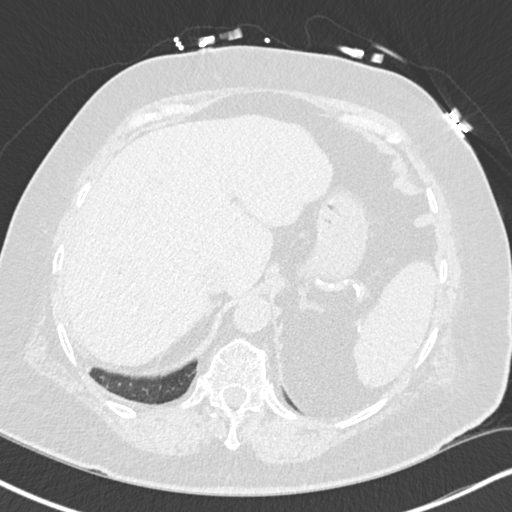
[im 43/146  lung]
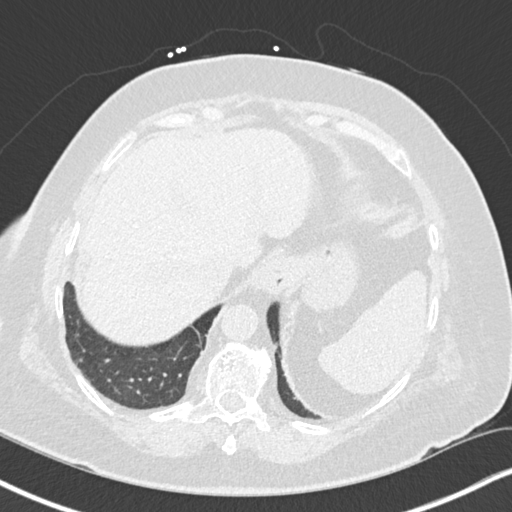
[im 54/146  mediastinal]
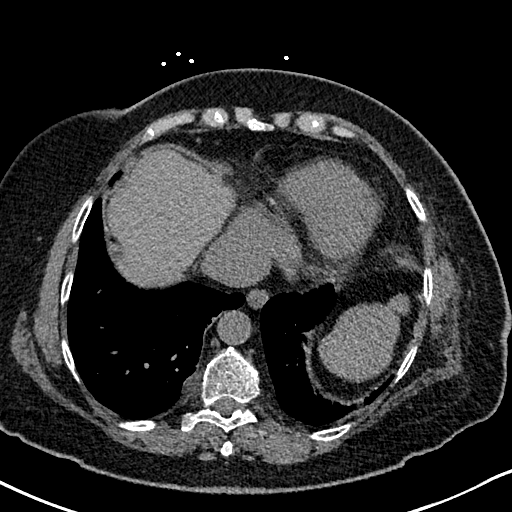
[im 54/146  lung]
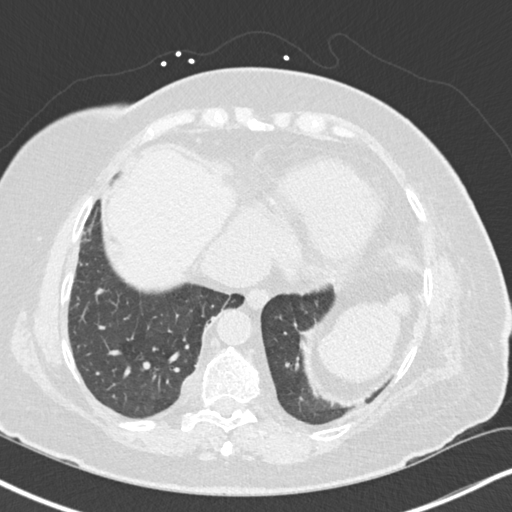
[im 65/146  lung]
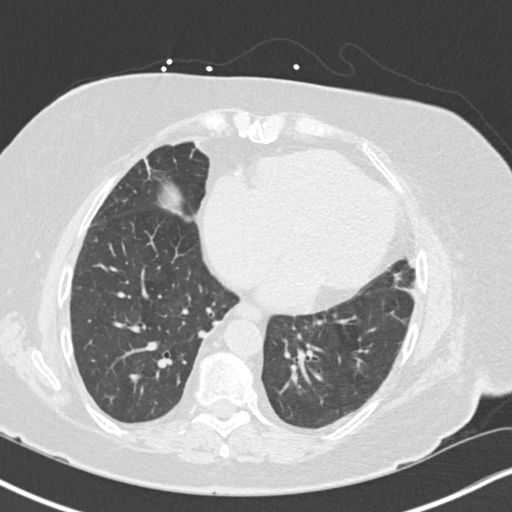
[im 81/146  lung]
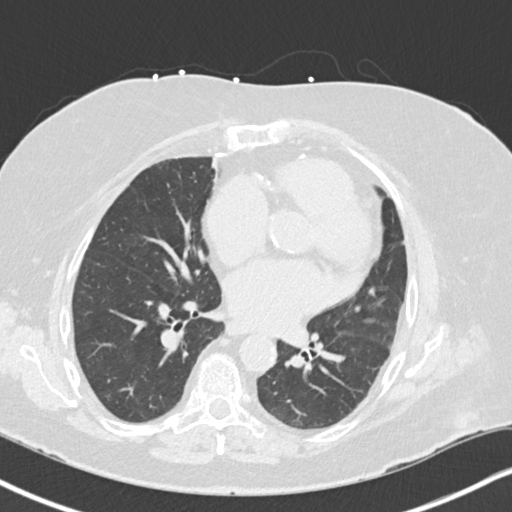
[im 92/146  lung]
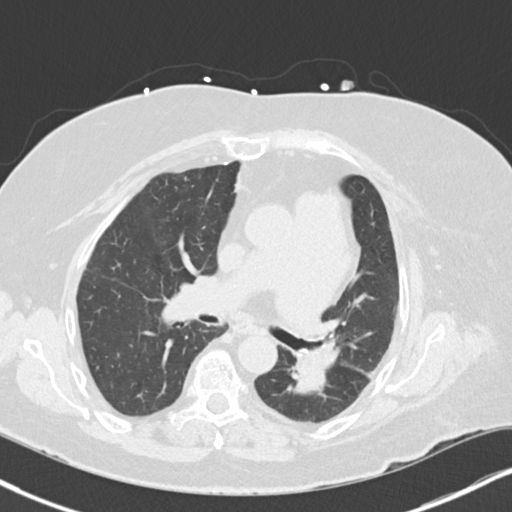
[im 103/146  mediastinal]
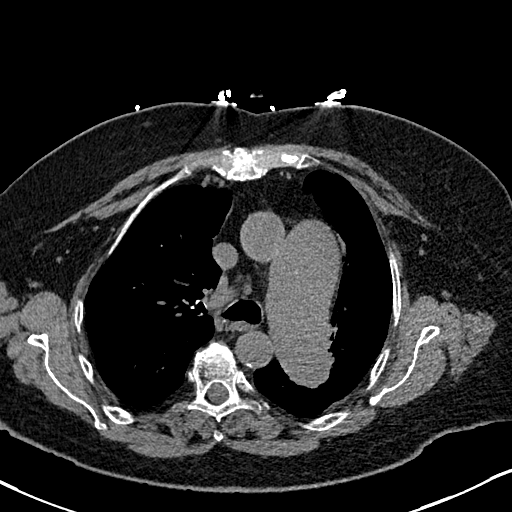
[im 103/146  lung]
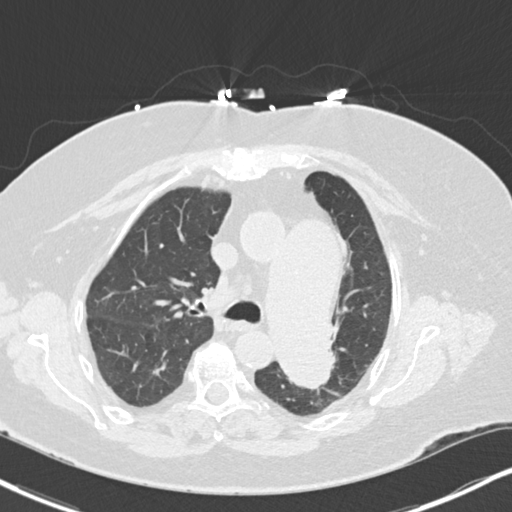
[im 113/146  lung]
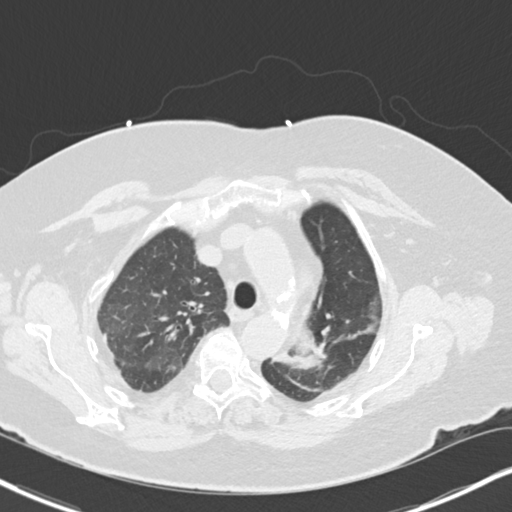
[im 124/146  lung]
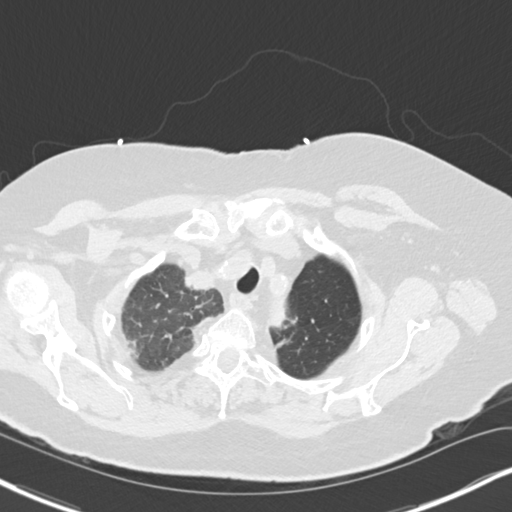
[im 135/146  lung]
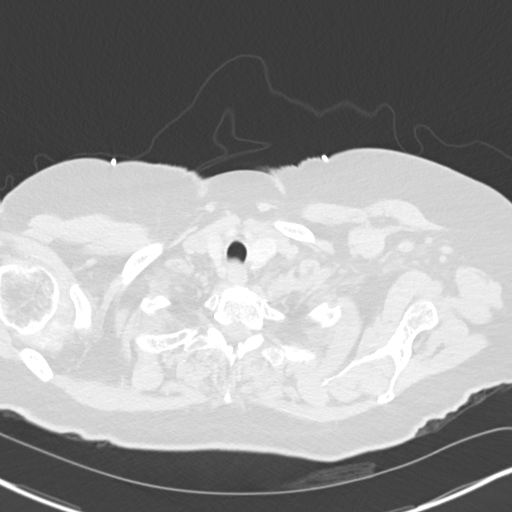

[Series 5: coronal · coronal · 0.59mm/px · 3 of 148 slices shown]
[im 30/148  lung]
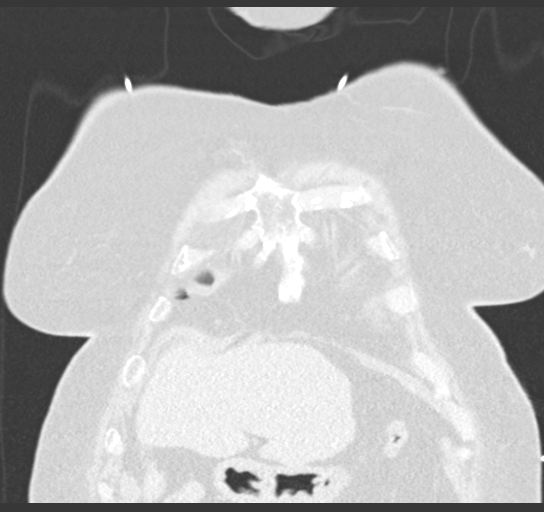
[im 59/148  lung]
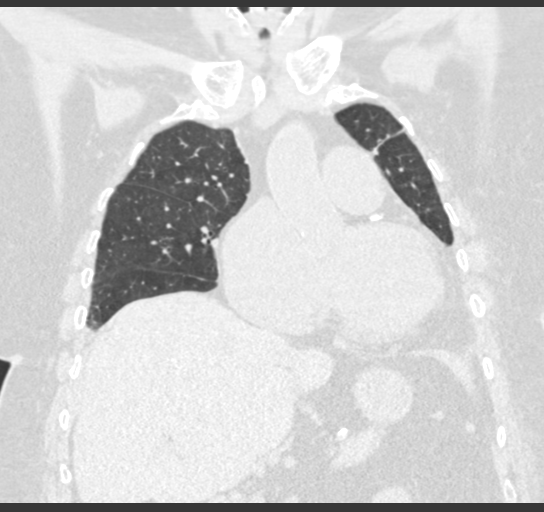
[im 89/148  lung]
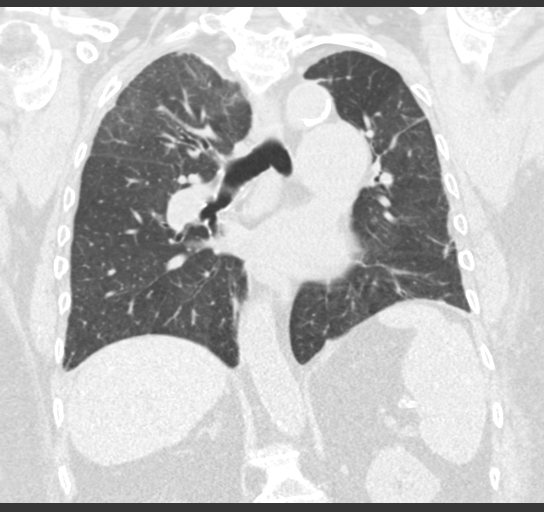

[15 of 36 positions shown; findings below may reference images not displayed]

FINDINGS: Cardiovascular: There is no demonstrable thoracic aortic aneurysm.
There is atherosclerotic calcification in the aorta. There are
multiple foci of coronary artery calcification. Pericardium is not
thickened. The visualized great vessels appear unremarkable except
for mild calcification at the origins of the left and right common
carotid arteries.

There is marked dilatation of the main pulmonary outflow tract
consistent with pulmonary arterial hypertension. The main pulmonary
outflow tract has a maximum measured diameter of 4.9 cm.

Mediastinum/Nodes: The thyroid is mildly inhomogeneous in appearance
without dominant mass evident. There are subcentimeter mediastinal
lymph nodes at several sites. There is a lymph node just anterior to
the distal trachea measuring 1.4 x 1.2 cm. There is a sub- carinal
lymph node measuring 1.2 x 1.0 cm.

Lungs/Pleura: There is mild scarring in each lung apex. There is
also scarring in the posterior segment of the left upper lobe and in
the anterior lung bases bilaterally. There is mild left base
atelectasis. There is no appreciable edema or consolidation.

Upper Abdomen: There is atherosclerotic calcification in the aorta
and major mesenteric vessels. Of the liver has a somewhat lobular
contour, raising question of a degree of underlying dx hepatic
cirrhosis. Caudate lobe is also hypertrophied with a relatively
small left lobe, findings that may be seen with hepatic cirrhosis.

Musculoskeletal: No blastic or lytic bone lesions are evident. There
is degenerative change in the thoracic spine.
IMPRESSION: Areas of scarring bilaterally, primarily on the left. No edema or
consolidation.

Pulmonary arterial hypertension with marked enlargement of the main
pulmonary outflow tract. There is fairly rapid peripheral tapering.

Multiple foci of atherosclerotic calcification. Multiple foci of
coronary artery calcification noted.

There are two mildly prominent mediastinal lymph nodes which may
well be reactive in etiology given other findings.

Contour of the liver is suggestive of hepatic cirrhosis. Appropriate
laboratory correlation advised.

## 2017-07-25 ENCOUNTER — Encounter: Payer: Medicare Other | Attending: Physician Assistant | Admitting: Physician Assistant

## 2017-07-25 DIAGNOSIS — L97222 Non-pressure chronic ulcer of left calf with fat layer exposed: Secondary | ICD-10-CM | POA: Diagnosis not present

## 2017-07-25 DIAGNOSIS — N183 Chronic kidney disease, stage 3 (moderate): Secondary | ICD-10-CM | POA: Insufficient documentation

## 2017-07-25 DIAGNOSIS — Z6837 Body mass index (BMI) 37.0-37.9, adult: Secondary | ICD-10-CM | POA: Insufficient documentation

## 2017-07-25 DIAGNOSIS — E11622 Type 2 diabetes mellitus with other skin ulcer: Secondary | ICD-10-CM | POA: Insufficient documentation

## 2017-07-25 DIAGNOSIS — E1122 Type 2 diabetes mellitus with diabetic chronic kidney disease: Secondary | ICD-10-CM | POA: Insufficient documentation

## 2017-07-25 DIAGNOSIS — I5022 Chronic systolic (congestive) heart failure: Secondary | ICD-10-CM | POA: Insufficient documentation

## 2017-07-25 DIAGNOSIS — E669 Obesity, unspecified: Secondary | ICD-10-CM | POA: Diagnosis not present

## 2017-07-25 DIAGNOSIS — I130 Hypertensive heart and chronic kidney disease with heart failure and stage 1 through stage 4 chronic kidney disease, or unspecified chronic kidney disease: Secondary | ICD-10-CM | POA: Insufficient documentation

## 2017-07-26 NOTE — Progress Notes (Signed)
[name] [name], [name] ([id number]) Visit Report for 07/25/2017 Arrival Information Details Patient Name: [name] [name] [name], [name] [name] [name] Date of Service: 07/25/2017 9:15 AM Medical Record Number: [id number] Patient Account Number: [id number] Date of Birth/Sex: 18-Jun-1926 (84 y.o. Female) Treating RN: Renne Crigler Primary Care Yaa Donnellan: Barbette Reichmann Other Clinician: Referring Rama Mcclintock: Barbette Reichmann Treating Tahani Potier/Extender: Linwood Dibbles, HOYT Weeks in Treatment: 9 Visit Information History Since Last Visit All ordered tests and consults were completed: No Patient Arrived: Wheel Chair Added or deleted any medications: No Arrival Time: 09:17 Any new allergies or adverse reactions: No Accompanied By: daughter Had a fall or experienced change in No Transfer Assistance: EasyPivot Patient activities of daily living that may affect Lift risk of falls: Patient Identification Verified: Yes Signs or symptoms of abuse/neglect since last visito No Secondary Verification Process Yes Hospitalized since last visit: No Completed: Pain Present Now: Yes Patient Requires Transmission-Based No Precautions: Patient Has Alerts: Yes Patient Alerts: Patient on Blood Thinner DMII warfarin Electronic Signature(s) Signed: 07/25/2017 4:43:00 PM By: Renne Crigler Entered By: Renne Crigler on 07/25/2017 09:18:01 [name] [name] [name] ([id number]) -------------------------------------------------------------------------------- Clinic Level of Care Assessment Details Patient Name: [name] [name] [name] Date of Service: 07/25/2017 9:15 AM Medical Record Number: [id number] Patient Account Number: [id number] Date of Birth/Sex: May 08, 1926 (84 y.o. Female) Treating RN: Renne Crigler Primary Care Fahmida Jurich: Barbette Reichmann Other Clinician: Referring Winn Muehl: Barbette Reichmann Treating Elia Keenum/Extender: Linwood Dibbles, HOYT Weeks in Treatment: 9 Clinic Level of Care Assessment Items TOOL 4 Quantity Score []  - Use when only an  EandM is performed on FOLLOW-UP visit 0 ASSESSMENTS - Nursing Assessment / Reassessment X - Reassessment of Co-morbidities (includes updates in patient status) 1 10 X- 1 5 Reassessment of Adherence to Treatment Plan ASSESSMENTS - Wound and Skin Assessment / Reassessment X - Simple Wound Assessment / Reassessment - one wound 1 5 []  - 0 Complex Wound Assessment / Reassessment - multiple wounds []  - 0 Dermatologic / Skin Assessment (not related to wound area) ASSESSMENTS - Focused Assessment []  - Circumferential Edema Measurements - multi extremities 0 []  - 0 Nutritional Assessment / Counseling / Intervention []  - 0 Lower Extremity Assessment (monofilament, tuning fork, pulses) []  - 0 Peripheral Arterial Disease Assessment (using hand held doppler) ASSESSMENTS - Ostomy and/or Continence Assessment and Care []  - Incontinence Assessment and Management 0 []  - 0 Ostomy Care Assessment and Management (repouching, etc.) PROCESS - Coordination of Care X - Simple Patient / Family Education for ongoing care 1 15 []  - 0 Complex (extensive) Patient / Family Education for ongoing care []  - 0 Staff obtains Chiropractor, Records, Test Results / Process Orders []  - 0 Staff telephones HHA, Nursing Homes / Clarify orders / etc []  - 0 Routine Transfer to another Facility (non-emergent condition) []  - 0 Routine Hospital Admission (non-emergent condition) []  - 0 New Admissions / Manufacturing engineer / Ordering NPWT, Apligraf, etc. []  - 0 Emergency Hospital Admission (emergent condition) X- 1 10 Simple Discharge Coordination [name], [name] [name] ([id number]) []  - 0 Complex (extensive) Discharge Coordination PROCESS - Special Needs []  - Pediatric / Minor Patient Management 0 []  - 0 Isolation Patient Management []  - 0 Hearing / Language / Visual special needs []  - 0 Assessment of Community assistance (transportation, D/C planning, etc.) []  - 0 Additional assistance / Altered mentation []  -  0 Support Surface(s) Assessment (bed, cushion, seat, etc.) INTERVENTIONS - Wound Cleansing / Measurement X - Simple Wound Cleansing - one wound 1 5 []  - 0 Complex Wound Cleansing - multiple wounds X- 1 5 Wound Imaging (photographs - any  number of wounds) []  - 0 Wound Tracing (instead of photographs) X- 1 5 Simple Wound Measurement - one wound []  - 0 Complex Wound Measurement - multiple wounds INTERVENTIONS - Wound Dressings X - Small Wound Dressing one or multiple wounds 1 10 []  - 0 Medium Wound Dressing one or multiple wounds []  - 0 Large Wound Dressing one or multiple wounds []  - 0 Application of Medications - topical []  - 0 Application of Medications - injection INTERVENTIONS - Miscellaneous []  - External ear exam 0 []  - 0 Specimen Collection (cultures, biopsies, blood, body fluids, etc.) []  - 0 Specimen(s) / Culture(s) sent or taken to Lab for analysis []  - 0 Patient Transfer (multiple staff / Nurse, adult / Similar devices) []  - 0 Simple Staple / Suture removal (25 or less) []  - 0 Complex Staple / Suture removal (26 or more) []  - 0 Hypo / Hyperglycemic Management (close monitor of Blood Glucose) []  - 0 Ankle / Brachial Index (ABI) - do not check if billed separately X- 1 5 Vital Signs [name], [name] [name] ([id number]) Has the patient been seen at the hospital within the last three years: Yes Total Score: 75 Level Of Care: New/Established - Level 2 Electronic Signature(s) Signed: 07/25/2017 4:43:00 PM By: Renne Crigler Entered By: Renne Crigler on 07/25/2017 09:48:04 [name] [name] [name] ([id number]) -------------------------------------------------------------------------------- Encounter Discharge Information Details Patient Name: [name] [name] [name] Date of Service: 07/25/2017 9:15 AM Medical Record Number: [id number] Patient Account Number: [id number] Date of Birth/Sex: 10-19-1925 (84 y.o. Female) Treating RN: Renne Crigler Primary Care Brazos Sandoval: Barbette Reichmann  Other Clinician: Referring Aimy Sweeting: Barbette Reichmann Treating Cienna Dumais/Extender: Linwood Dibbles, HOYT Weeks in Treatment: 9 Encounter Discharge Information Items Discharge Pain Level: 0 Discharge Condition: Stable Ambulatory Status: Wheelchair Discharge Destination: Home Transportation: Private Auto Accompanied By: daughter Schedule Follow-up Appointment: No Medication Reconciliation completed and No provided to Patient/Care Teghan Philbin: Provided on Clinical Summary of Care: 07/25/2017 Form Type Recipient Paper Patient MS Electronic Signature(s) Signed: 07/25/2017 4:43:00 PM By: Renne Crigler Entered By: Renne Crigler on 07/25/2017 09:49:11 [name] [name] [name] ([id number]) -------------------------------------------------------------------------------- Lower Extremity Assessment Details Patient Name: [name] [name] [name] Date of Service: 07/25/2017 9:15 AM Medical Record Number: [id number] Patient Account Number: [id number] Date of Birth/Sex: August 16, 1925 (84 y.o. Female) Treating RN: Renne Crigler Primary Care Griffyn Kucinski: Barbette Reichmann Other Clinician: Referring Latrease Kunde: Barbette Reichmann Treating Latorie Montesano/Extender: Linwood Dibbles, HOYT Weeks in Treatment: 9 Edema Assessment Assessed: [Left: No] [Right: No] Edema: [Left: Ye] [Right: s] Vascular Assessment Claudication: Claudication Assessment [Right:None] Pulses: Dorsalis Pedis Palpable: [Right:No] Doppler Audible: [Right:Yes] Posterior Tibial Extremity colors, hair growth, and conditions: Extremity Color: [Right:Hyperpigmented] Hair Growth on Extremity: [Right:No] Temperature of Extremity: [Right:Cool] Capillary Refill: [Right:< 3 seconds] Toe Nail Assessment Left: Right: Thick: Yes Discolored: Yes Deformed: No Improper Length and Hygiene: Yes Electronic Signature(s) Signed: 07/25/2017 4:43:00 PM By: Renne Crigler Entered By: Renne Crigler on 07/25/2017 09:29:19 [name] [name] [name]  ([id number]) -------------------------------------------------------------------------------- Multi Wound Chart Details Patient Name: [name] [name] [name] Date of Service: 07/25/2017 9:15 AM Medical Record Number: [id number] Patient Account Number: [id number] Date of Birth/Sex: January 07, 1926 (84 y.o. Female) Treating RN: Renne Crigler Primary Care Mckenna Gamm: Barbette Reichmann Other Clinician: Referring Corion Sherrod: Barbette Reichmann Treating Kamyiah Colantonio/Extender: Linwood Dibbles, HOYT Weeks in Treatment: 9 Vital Signs Height(in): 63 Pulse(bpm): 70 Weight(lbs): 212 Blood Pressure(mmHg): 100/46 Body Mass Index(BMI): 38 Temperature(F): 97.6 Respiratory Rate 22 (breaths/min): Photos: [2:No Photos] [N/A:N/A] Wound Location: [2:Right Lower Leg - Lateral] [N/A:N/A] Wounding Event: [2:Blister] [N/A:N/A] Primary Etiology: [2:Diabetic Wound/Ulcer of the Lower Extremity] [N/A:N/A] Secondary Etiology: [2:Lymphedema] [N/A:N/A] Comorbid History: [2:Cataracts, Anemia, Arrhythmia, Congestive  Heart Failure, Hypertension, Type II Diabetes] [N/A:N/A] Date Acquired: [2:07/08/2017] [N/A:N/A] Weeks of Treatment: [2:2] [N/A:N/A] Wound Status: [2:Open] [N/A:N/A] Measurements L x W x D [2:8x0.8x0.1] [N/A:N/A] (cm) Area (cm) : [2:5.027] [N/A:N/A] Volume (cm) : [2:0.503] [N/A:N/A] % Reduction in Area: [2:-1676.30%] [N/A:N/A] % Reduction in Volume: [2:-1696.40%] [N/A:N/A] Classification: [2:Grade 1] [N/A:N/A] Exudate Amount: [2:Large] [N/A:N/A] Exudate Type: [2:Serous] [N/A:N/A] Exudate Color: [2:amber] [N/A:N/A] Wound Margin: [2:Flat and Intact] [N/A:N/A] Granulation Amount: [2:Large (67-100%)] [N/A:N/A] Granulation Quality: [2:Pink] [N/A:N/A] Necrotic Amount: [2:Small (1-33%)] [N/A:N/A] Exposed Structures: [2:Fascia: No Fat Layer (Subcutaneous Tissue) Exposed: No Tendon: No Muscle: No Joint: No Bone: No] [N/A:N/A] Epithelialization: [2:Small (1-33%)] [N/A:N/A] Periwound Skin Texture: [2:Excoriation: No  Induration: No] [N/A:N/A] Callus: No Crepitus: No Rash: No Scarring: No Periwound Skin Moisture: Maceration: No N/A N/A Dry/Scaly: No Periwound Skin Color: Atrophie Blanche: No N/A N/A Cyanosis: No Ecchymosis: No Erythema: No Hemosiderin Staining: No Mottled: No Pallor: No Rubor: No Temperature: No Abnormality N/A N/A Tenderness on Palpation: Yes N/A N/A Wound Preparation: Ulcer Cleansing: N/A N/A Rinsed/Irrigated with Saline Topical Anesthetic Applied: Other: lidocaine 4% Treatment Notes Electronic Signature(s) Signed: 07/25/2017 4:43:00 PM By: Renne Crigler Entered By: Renne Crigler on 07/25/2017 09:35:05 [name] [name] [name] ([id number]) -------------------------------------------------------------------------------- Multi-Disciplinary Care Plan Details Patient Name: [name] [name] [name] Date of Service: 07/25/2017 9:15 AM Medical Record Number: [id number] Patient Account Number: [id number] Date of Birth/Sex: Jan 27, 1926 (84 y.o. Female) Treating RN: Renne Crigler Primary Care Zali Kamaka: Barbette Reichmann Other Clinician: Referring Pedrohenrique Mcconville: Barbette Reichmann Treating Torryn Fiske/Extender: Linwood Dibbles, HOYT Weeks in Treatment: 9 Active Inactive ` Abuse / Safety / Falls / Self Care Management Nursing Diagnoses: Impaired physical mobility Goals: Patient will not experience any injury related to falls Date Initiated: 05/19/2017 Target Resolution Date: 07/30/2017 Goal Status: Active Interventions: Assess fall risk on admission and as needed Notes: ` Orientation to the Wound Care Program Nursing Diagnoses: Knowledge deficit related to the wound healing center program Goals: Patient/caregiver will verbalize understanding of the Wound Healing Center Program Date Initiated: 05/19/2017 Target Resolution Date: 07/30/2017 Goal Status: Active Interventions: Provide education on orientation to the wound center Notes: ` Wound/Skin Impairment Nursing Diagnoses: Impaired tissue  integrity Goals: Ulcer/skin breakdown will heal within 14 weeks Date Initiated: 05/19/2017 Target Resolution Date: 07/30/2017 Goal Status: Active Interventions: [name] [name] [name], [name] [name] [name] ([id number]) Assess patient/caregiver ability to obtain necessary supplies Assess patient/caregiver ability to perform ulcer/skin care regimen upon admission and as needed Assess ulceration(s) every visit Notes: Electronic Signature(s) Signed: 07/25/2017 4:43:00 PM By: Renne Crigler Entered By: Renne Crigler on 07/25/2017 09:34:55 [name] [name] [name] ([id number]) -------------------------------------------------------------------------------- Pain Assessment Details Patient Name: [name] [name] [name] Date of Service: 07/25/2017 9:15 AM Medical Record Number: [id number] Patient Account Number: [id number] Date of Birth/Sex: 1926/02/01 (84 y.o. Female) Treating RN: Renne Crigler Primary Care Win Guajardo: Barbette Reichmann Other Clinician: Referring Stancil Deisher: Barbette Reichmann Treating Ellieanna Funderburg/Extender: Linwood Dibbles, HOYT Weeks in Treatment: 9 Active Problems Location of Pain Severity and Description of Pain Patient Has Paino Yes Site Locations Pain Location: Pain in Ulcers With Dressing Change: Yes Duration of the Pain. Constant / Intermittento Intermittent Rate the pain. Current Pain Level: 5 Pain Management and Medication Current Pain Management: Electronic Signature(s) Signed: 07/25/2017 4:43:00 PM By: Renne Crigler Entered By: Renne Crigler on 07/25/2017 09:18:16 [name] [name] [name] ([id number]) -------------------------------------------------------------------------------- Patient/Caregiver Education Details Patient Name: [name] [name] [name] Date of Service: 07/25/2017 9:15 AM Medical Record Number: [id number] Patient Account Number: [id number] Date of Birth/Gender: Aug 09, 1925 (84 y.o. Female) Treating RN: Renne Crigler Primary Care Physician: Barbette Reichmann Other Clinician: Referring Physician:  Barbette Reichmann Treating Physician/Extender: Linwood Dibbles, HOYT  Weeks in Treatment: 9 Education Assessment Education Provided To: Patient and Caregiver daughter Education Topics Provided Wound/Skin Impairment: Methods: Explain/Verbal Responses: State content correctly Electronic Signature(s) Signed: 07/25/2017 4:43:00 PM By: Renne CriglerFlinchum, Cheryl Entered By: Renne CriglerFlinchum, Cheryl on 07/25/2017 09:50:06 [name] [name] BudSIEGEL, [name] [name] [name] ([id number]) -------------------------------------------------------------------------------- Wound Assessment Details Patient Name: [name] [name] BudSIEGEL, [name] [name] [name] Date of Service: 07/25/2017 9:15 AM Medical Record Number: [id number] Patient Account Number: [id number] Date of Birth/Sex: 1926/06/21 (84 y.o. Female) Treating RN: Renne CriglerFlinchum, Cheryl Primary Care Tianne Plott: Barbette ReichmannHande, Vishwanath Other Clinician: Referring Deshone Lyssy: Barbette ReichmannHande, Vishwanath Treating Andreal Vultaggio/Extender: Linwood DibblesSTONE III, HOYT Weeks in Treatment: 9 Wound Status Wound Number: 2 Primary Diabetic Wound/Ulcer of the Lower Extremity Etiology: Wound Location: Right Lower Leg - Lateral Secondary Lymphedema Wounding Event: Blister Etiology: Date Acquired: 07/08/2017 Wound Status: Open Weeks Of Treatment: 2 Comorbid Cataracts, Anemia, Arrhythmia, Congestive Clustered Wound: No History: Heart Failure, Hypertension, Type II Diabetes Photos Photo Uploaded By: Renne CriglerFlinchum, Cheryl on 07/25/2017 12:33:25 Wound Measurements Length: (cm) 8 Width: (cm) 0.8 Depth: (cm) 0.1 Area: (cm) 5.027 Volume: (cm) 0.503 % Reduction in Area: -1676.3% % Reduction in Volume: -1696.4% Epithelialization: Small (1-33%) Tunneling: No Undermining: No Wound Description Classification: Grade 1 Wound Margin: Flat and Intact Exudate Amount: Large Exudate Type: Serous Exudate Color: amber Foul Odor After Cleansing: No Slough/Fibrino Yes Wound Bed Granulation Amount: Large (67-100%) Exposed Structure Granulation Quality: Pink Fascia Exposed: No Necrotic Amount:  Small (1-33%) Fat Layer (Subcutaneous Tissue) Exposed: No Necrotic Quality: Adherent Slough Tendon Exposed: No Muscle Exposed: No Joint Exposed: No Bone Exposed: No Periwound Skin Texture [name], [name] [name] ([id number]) Texture Color No Abnormalities Noted: No No Abnormalities Noted: No Callus: No Atrophie Blanche: No Crepitus: No Cyanosis: No Excoriation: No Ecchymosis: No Induration: No Erythema: No Rash: No Hemosiderin Staining: No Scarring: No Mottled: No Pallor: No Moisture Rubor: No No Abnormalities Noted: No Dry / Scaly: No Temperature / Pain Maceration: No Temperature: No Abnormality Tenderness on Palpation: Yes Wound Preparation Ulcer Cleansing: Rinsed/Irrigated with Saline Topical Anesthetic Applied: Other: lidocaine 4%, Treatment Notes Wound #2 (Right, Lateral Lower Leg) 1. Cleansed with: Clean wound with Normal Saline 2. Anesthetic Topical Lidocaine 4% cream to wound bed prior to debridement 4. Dressing Applied: Prisma Ag 5. Secondary Dressing Applied Non-Adherent pad Notes coban lightly to secure Electronic Signature(s) Signed: 07/25/2017 4:43:00 PM By: Renne CriglerFlinchum, Cheryl Entered By: Renne CriglerFlinchum, Cheryl on 07/25/2017 09:28:33 [name] [name] BudSIEGEL, [name] [name] [name] ([id number]) -------------------------------------------------------------------------------- Vitals Details Patient Name: [name] [name] BudSIEGEL, [name] [name] [name] Date of Service: 07/25/2017 9:15 AM Medical Record Number: [id number] Patient Account Number: [id number] Date of Birth/Sex: 1926/06/21 (84 y.o. Female) Treating RN: Renne CriglerFlinchum, Cheryl Primary Care Betina Puckett: Barbette ReichmannHande, Vishwanath Other Clinician: Referring Broghan Pannone: Barbette ReichmannHande, Vishwanath Treating Keval Nam/Extender: Linwood DibblesSTONE III, HOYT Weeks in Treatment: 9 Vital Signs Time Taken: 09:19 Temperature (F): 97.6 Height (in): 63 Pulse (bpm): 70 Weight (lbs): 212 Respiratory Rate (breaths/min): 22 Body Mass Index (BMI): 37.6 Blood Pressure (mmHg): 100/46 Reference Range: 80 - 120 mg /  dl Electronic Signature(s) Signed: 07/25/2017 4:43:00 PM By: Renne CriglerFlinchum, Cheryl Entered By: Renne CriglerFlinchum, Cheryl on 07/25/2017 24:40:1009:23:24

## 2017-07-26 NOTE — Progress Notes (Addendum)
[name] [name], Bassy ([id number]) Visit Report for 07/25/2017 Chief Complaint Document Details Patient Name: [name] [name], [name] [name] Date of Service: 07/25/2017 9:15 AM Medical Record Number: [id number] Patient Account Number: [id number] Date of Birth/Sex: [birth year] (84 y.o. Female) Treating RN: Renne CriglerFlinchum, Cheryl Primary Care Provider: Barbette ReichmannHande, Vishwanath Other Clinician: Referring Provider: Barbette ReichmannHande, Vishwanath Treating Provider/Extender: Linwood DibblesSTONE III, Kattie Santoyo Weeks in Treatment: 9 Information Obtained from: Patient Chief Complaint Patient presents to the wound care center for a consult due non healing wound to the left lower extremity with bilateral massive swelling of the legs Electronic Signature(s) Signed: 07/25/2017 4:49:26 PM By: Lenda KelpStone III, Fischer Halley PA-C Entered By: Lenda KelpStone III, Hadriel Northup on 07/25/2017 14:08:56 Chinese CampSIEGEL, [name] CheMARGARET ([id number]) -------------------------------------------------------------------------------- HPI Details Patient Name: [name] [name], [name] [name] Date of Service: 07/25/2017 9:15 AM Medical Record Number: [id number] Patient Account Number: [id number] Date of Birth/Sex: [birth year] (84 y.o. Female) Treating RN: Renne CriglerFlinchum, Cheryl Primary Care Provider: Barbette ReichmannHande, Vishwanath Other Clinician: Referring Provider: Barbette ReichmannHande, Vishwanath Treating Provider/Extender: Linwood DibblesSTONE III, Kyston Gonce Weeks in Treatment: 9 History of Present Illness HPI Description: 84 year old patient seen by her PCP and referred to our center for left lower extremity stasis ulceration. She is known to have diabetes mellitus type 2, hypertension,status post cholecystectomy and open heart surgery, and atrial fibrillation and has bilateral lower extremity lymphedema with oozing of fluid. Her most recent hemoglobin A1c was 7.2. after assessment by the PCP she was put on torsemide 20 mg daily, echo was noted to have an ejection fraction of more than 55% with moderate MR, and the patient was maintained on warfarin for atrial fibrillation. the [place] a [place] but has been traveling a bit and has not had any arterial or venous studies done recently. Her last echo was about a year and a half ago. 05/27/2017 -- she has not had any dressing changes since the last time she was here as a home health did not turn up. Her arterial duplex study is scheduled for next week. 06/03/2017 -- -- had a lower extremity arterial study done and there was arterial wall calcification and the resting ABI was noncompressible bilaterally. The right and left toe brachial indices were abnormal with the left first toe pressure being 76 mmHg and the right toe pressure was 89 mmHg. The right toe brachial index was 0.55 and the left toe brachial index was 0.47. 07/04/17 on evaluation today patient appears to be doing well in regard to her left lower chimney ulcer. She has been tolerating the current treatment plan without any complication. Unfortunately they did get compression hose delivered that were 30-40 mmHg but unfortunately she is unable to wear these. Her daughter was able to find 18 mmHg compression socks at the drugstore which she has been able to tolerate without any complication. This seems like it may be a better option for her. It's not as much compression as I would like but also it's something she's actually able to wear and something is going to be better than nothing. 07/11/17 on evaluation today patient appears to be doing fairly well regard to her left lower extremity ulcer. Unfortunately she does have a new blister on the right lower extremity which has opened at this point. Fortunately there does not appear to be any evidence of infection which is good news. With that being said one of the bigger issues today is that she seems to be having quite a bit of difficulty breathing she has a lot of audible rhonchi even before I listen to her with the stethoscope.  No fevers, chills, nausea, or vomiting noted at this time. She does  seem to be feeling very poorly according to her daughter she's also not walking and she normally would presumably this is due to the fact that she is short of breath. 07/25/17 patient appears to be showing signs of improvement in regard to left lower extremity ulcer. She has been tolerating the dressing changes without complication. She was placed on Keflex 500 mg three times a day due to cellulitis after evaluation by home health around Wednesday of last week. She is still taking [place]. Other than that her blood pressure was somewhat low today at 100/42 this was taken manually. I did recommend that she we take this at home with her blood pressure cuff as this is something that her daughter states is normally not that low. With that being said if it remains low they may need to contact patient's primary care provider. Electronic Signature(s) Signed: 07/25/2017 4:49:26 PM By: Lenda Kelp PA-C Entered By: Lenda Kelp on 07/25/2017 14:10:48 [name] [name] ([id number]) -------------------------------------------------------------------------------- Physical Exam Details Patient Name: [name] [name] Date of Service: 07/25/2017 9:15 AM Medical Record Number: [id number] Patient Account Number: [id number] Date of Birth/Sex: 1926-03-29 (84 y.o. Female) Treating RN: Renne Crigler Primary Care Provider: Barbette Reichmann Other Clinician: Referring Provider: Barbette Reichmann Treating Provider/Extender: STONE III, Aliveah Gallant Weeks in Treatment: 9 Constitutional Obese and well-hydrated in no acute distress. Respiratory normal breathing without difficulty. clear to auscultation bilaterally. Cardiovascular regular rate and rhythm with normal S1, S2. 1+ pitting edema of the bilateral lower extremities. Psychiatric this patient is able to make decisions and demonstrates good insight into disease process. Alert and Oriented x 3. pleasant and cooperative. Notes Since wound does show a good wound bed at  this point but no evidence of slough covering and there does not appear to be any evidence of significant infection which is good news. Electronic Signature(s) Signed: 07/25/2017 4:49:26 PM By: Lenda Kelp PA-C Entered By: Lenda Kelp on 07/25/2017 14:11:48 [name] [name] ([id number]) -------------------------------------------------------------------------------- Physician Orders Details Patient Name: [name] [name] Date of Service: 07/25/2017 9:15 AM Medical Record Number: [id number] Patient Account Number: [id number] Date of Birth/Sex: July 22, 1925 (84 y.o. Female) Treating RN: Renne Crigler Primary Care Provider: Barbette Reichmann Other Clinician: Referring Provider: Barbette Reichmann Treating Provider/Extender: Linwood Dibbles, Osvaldo Lamping Weeks in Treatment: 9 Verbal / Phone Orders: No Diagnosis Coding Wound Cleansing Wound #2 Right,Lateral Lower Leg o Clean wound with Normal Saline. Anesthetic (add to Medication List) Wound #2 Right,Lateral Lower Leg o Topical Lidocaine 4% cream applied to wound bed prior to debridement (In Clinic Only). o Hurricaine Topical Anesthetic Spray applied to wound bed prior to debridement (In Clinic Only). Primary Wound Dressing Wound #2 Right,Lateral Lower Leg o Prisma Ag Secondary Dressing Wound #2 Right,Lateral Lower Leg o Non-adherent pad - secure lightly with coban - DO NOT WRAP COBAN TIGHTLY Dressing Change Frequency Wound #2 Right,Lateral Lower Leg o Change dressing every other day. Follow-up Appointments Wound #2 Right,Lateral Lower Leg o Return Appointment in 1 week. Edema Control Wound #2 Right,Lateral Lower Leg o Patient to wear own compression stockings o Elevate legs to the level of the heart and pump ankles as often as possible Additional Orders / Instructions Wound #2 Right,Lateral Lower Leg o Increase protein intake. o Other: - Please try to keep blood sugars below 180. Please add over the counter vitamin  C, multivitamin and zinc supplements to your diet. Home Health Wound #2 Right,Lateral Lower Leg o Continue Home Health  Visits - [facility] o Home Health Nurse may visit PRN to address patientos wound care needs. [name] [name], [name] [name] ([id number]) o FACE TO FACE ENCOUNTER: MEDICARE and MEDICAID PATIENTS: I certify that this patient is under my care and that I had a face-to-face encounter that meets the physician face-to-face encounter requirements with this patient on this date. The encounter with the patient was in whole or in part for the following MEDICAL CONDITION: (primary reason for Home Healthcare) MEDICAL NECESSITY: I certify, that based on my findings, NURSING services are a medically necessary home health service. HOME BOUND STATUS: I certify that my clinical findings support that this patient is homebound (i.e., Due to illness or injury, pt requires aid of supportive devices such as crutches, cane, wheelchairs, walkers, the use of special transportation or the assistance of another person to leave their place of residence. There is a normal inability to leave the home and doing so requires considerable and taxing effort. Other absences are for medical reasons / religious services and are infrequent or of short duration when for other reasons). o If current dressing causes regression in wound condition, may D/C ordered dressing product/s and apply Normal Saline Moist Dressing daily until next Wound Healing Center / Other MD appointment. Notify Wound Healing Center of regression in wound condition at [phone]. o Please direct any NON-WOUND related issues/requests for orders to patient's Primary Care Physician Electronic Signature(s) Signed: 07/25/2017 4:43:00 PM By: Renne Crigler Signed: 07/25/2017 4:49:26 PM By: Lenda Kelp PA-C Entered By: Renne Crigler on 07/25/2017 09:37:52 [name] [name], [name] [name]  ([id number]) -------------------------------------------------------------------------------- Problem List Details Patient Name: [name] [name] Date of Service: 07/25/2017 9:15 AM Medical Record Number: [id number] Patient Account Number: [id number] Date of Birth/Sex: September 28, 1925 (84 y.o. Female) Treating RN: Renne Crigler Primary Care Provider: Barbette Reichmann Other Clinician: Referring Provider: Barbette Reichmann Treating Provider/Extender: Linwood Dibbles, Valarie Farace Weeks in Treatment: 9 Active Problems ICD-10 Encounter Code Description Active Date Diagnosis E11.622 Type 2 diabetes mellitus with other skin ulcer 05/19/2017 Yes I89.0 Lymphedema, not elsewhere classified 05/19/2017 Yes L97.222 Non-pressure chronic ulcer of left calf with fat layer exposed 05/19/2017 Yes L97.212 Non-pressure chronic ulcer of right calf with fat layer exposed 07/11/2017 Yes I50.22 Chronic systolic (congestive) heart failure 05/19/2017 Yes N18.3 Chronic kidney disease, stage 3 (moderate) 05/19/2017 Yes Inactive Problems Resolved Problems Electronic Signature(s) Signed: 07/25/2017 4:49:26 PM By: Lenda Kelp PA-C Entered By: Lenda Kelp on 07/25/2017 14:08:27 [name] [name] ([id number]) -------------------------------------------------------------------------------- Progress Note Details Patient Name: [name] [name] Date of Service: 07/25/2017 9:15 AM Medical Record Number: [id number] Patient Account Number: [id number] Date of Birth/Sex: [birth year] (84 y.o. Female) Treating RN: Renne Crigler Primary Care Provider: Barbette Reichmann Other Clinician: Referring Provider: Barbette Reichmann Treating Provider/Extender: Linwood Dibbles, Areli Frary Weeks in Treatment: 9 Subjective Chief Complaint Information obtained from Patient Patient presents to the wound care center for a consult due non healing wound to the left lower extremity with bilateral massive swelling of the legs History of Present Illness  (HPI) 84 year old patient seen by her PCP and referred to our center for left lower extremity stasis ulceration. She is known to have diabetes mellitus type 2, hypertension,status post cholecystectomy and open heart surgery, and atrial fibrillation and has bilateral lower extremity lymphedema with oozing of fluid. Her most recent hemoglobin A1c was 7.2. after assessment by the PCP she was put on torsemide 20 mg daily, echo was noted to have an ejection fraction of more than 55% with moderate MR, and the patient was maintained on warfarin for atrial fibrillation. the  patient has been living in [place] for about a year but has been traveling a bit and has not had any arterial or venous studies done recently. Her last echo was about a year and a half ago. 05/27/2017 -- she has not had any dressing changes since the last time she was here as a home health did not turn up. Her arterial duplex study is scheduled for next week. 06/03/2017 -- -- had a lower extremity arterial study done and there was arterial wall calcification and the resting ABI was noncompressible bilaterally. The right and left toe brachial indices were abnormal with the left first toe pressure being 76 mmHg and the right toe pressure was 89 mmHg. The right toe brachial index was 0.55 and the left toe brachial index was 0.47. 07/04/17 on evaluation today patient appears to be doing well in regard to her left lower chimney ulcer. She has been tolerating the current treatment plan without any complication. Unfortunately they did get compression hose delivered that were 30-40 mmHg but unfortunately she is unable to wear these. Her daughter was able to find 18 mmHg compression socks at the drugstore which she has been able to tolerate without any complication. This seems like it may be a better option for her. It's not as much compression as I would like but also it's something she's actually able to wear and something is going to  be better than nothing. 07/11/17 on evaluation today patient appears to be doing fairly well regard to her left lower extremity ulcer. Unfortunately she does have a new blister on the right lower extremity which has opened at this point. Fortunately there does not appear to be any evidence of infection which is good news. With that being said one of the bigger issues today is that she seems to be having quite a bit of difficulty breathing she has a lot of audible rhonchi even before I listen to her with the stethoscope. No fevers, chills, nausea, or vomiting noted at this time. She does seem to be feeling very poorly according to her daughter she's also not walking and she normally would presumably this is due to the fact that she is short of breath. 07/25/17 patient appears to be showing signs of improvement in regard to left lower extremity ulcer. She has been tolerating the dressing changes without complication. She was placed on Keflex 500 mg three times a day due to cellulitis after evaluation by home health around Wednesday of last week. She is still taking [place]. Other than that her blood pressure was somewhat low today at 100/42 this was taken manually. I did recommend that she we take this at home with her blood pressure cuff as this is something that her daughter states is normally not that low. With that being said if it remains low they may need to contact patient's primary care provider. Patient History [name] [name], [name] [name] ([id number]) Information obtained from Patient. Family History Cancer - Child, Heart Disease - Mother, No family history of Diabetes, Hereditary Spherocytosis, Hypertension, Kidney Disease, Lung Disease, Seizures, Stroke, Thyroid Problems, Tuberculosis. Social History Former smoker - quit 60 years ago, Marital Status - Widowed, Alcohol Use - Never, Drug Use - No History, Caffeine Use - Daily. Medical And Surgical History Notes Eyes macular degeneration Review of  Systems (ROS) Constitutional Symptoms (General Health) Denies complaints or symptoms of Fever, Chills. Respiratory The patient has no complaints or symptoms. Cardiovascular Complains or has symptoms of LE edema. Psychiatric The patient has no  complaints or symptoms. Objective Constitutional Obese and well-hydrated in no acute distress. Vitals Time Taken: 9:19 AM, Height: 63 in, Weight: 212 lbs, BMI: 37.6, Temperature: 97.6 F, Pulse: 70 bpm, Respiratory Rate: 22 breaths/min, Blood Pressure: 100/46 mmHg. Respiratory normal breathing without difficulty. clear to auscultation bilaterally. Cardiovascular regular rate and rhythm with normal S1, S2. 1+ pitting edema of the bilateral lower extremities. Psychiatric this patient is able to make decisions and demonstrates good insight into disease process. Alert and Oriented x 3. pleasant and cooperative. General Notes: Since wound does show a good wound bed at this point but no evidence of slough covering and there does not appear to be any evidence of significant infection which is good news. Integumentary (Hair, Skin) Wound #2 status is Open. Original cause of wound was Blister. The wound is located on the Right,Lateral Lower Leg. The wound measures 0.8cm length x 0.8cm width x 0.1cm depth; 0.503cm^2 area and 0.05cm^3 volume. There is no tunneling or undermining noted. There is a large amount of serous drainage noted. The wound margin is flat and intact. There is large (67- [name] [name], [name] [name] ([id number]) 100%) pink granulation within the wound bed. There is a small (1-33%) amount of necrotic tissue within the wound bed including Adherent Slough. The periwound skin appearance did not exhibit: Callus, Crepitus, Excoriation, Induration, Rash, Scarring, Dry/Scaly, Maceration, Atrophie Blanche, Cyanosis, Ecchymosis, Hemosiderin Staining, Mottled, Pallor, Rubor, Erythema. Periwound temperature was noted as No Abnormality. The periwound has  tenderness on palpation. Assessment Active Problems ICD-10 E11.622 - Type 2 diabetes mellitus with other skin ulcer I89.0 - Lymphedema, not elsewhere classified L97.222 - Non-pressure chronic ulcer of left calf with fat layer exposed L97.212 - Non-pressure chronic ulcer of right calf with fat layer exposed I50.22 - Chronic systolic (congestive) heart failure N18.3 - Chronic kidney disease, stage 3 (moderate) Plan Wound Cleansing: Wound #2 Right,Lateral Lower Leg: Clean wound with Normal Saline. Anesthetic (add to Medication List): Wound #2 Right,Lateral Lower Leg: Topical Lidocaine 4% cream applied to wound bed prior to debridement (In Clinic Only). Hurricaine Topical Anesthetic Spray applied to wound bed prior to debridement (In Clinic Only). Primary Wound Dressing: Wound #2 Right,Lateral Lower Leg: Prisma Ag Secondary Dressing: Wound #2 Right,Lateral Lower Leg: Non-adherent pad - secure lightly with coban - DO NOT WRAP COBAN TIGHTLY Dressing Change Frequency: Wound #2 Right,Lateral Lower Leg: Change dressing every other day. Follow-up Appointments: Wound #2 Right,Lateral Lower Leg: Return Appointment in 1 week. Edema Control: Wound #2 Right,Lateral Lower Leg: Patient to wear own compression stockings Elevate legs to the level of the heart and pump ankles as often as possible Additional Orders / Instructions: Wound #2 Right,Lateral Lower Leg: Increase protein intake. Other: - Please try to keep blood sugars below 180. Please add over the counter vitamin C, multivitamin and zinc supplements to your diet. Home Health: Wound #2 Right,Lateral Lower Leg: Continue Home Health Visits - [name] [name], [name] [name] ([id number]) Home Health Nurse may visit PRN to address patient s wound care needs. FACE TO FACE ENCOUNTER: MEDICARE and MEDICAID PATIENTS: I certify that this patient is under my care and that I had a face-to-face encounter that meets the physician face-to-face encounter  requirements with this patient on this date. The encounter with the patient was in whole or in part for the following MEDICAL CONDITION: (primary reason for Home Healthcare) MEDICAL NECESSITY: I certify, that based on my findings, NURSING services are a medically necessary home health service. HOME BOUND STATUS: I certify that my clinical findings support that  this patient is homebound (i.e., Due to illness or injury, pt requires aid of supportive devices such as crutches, cane, wheelchairs, walkers, the use of special transportation or the assistance of another person to leave their place of residence. There is a normal inability to leave the home and doing so requires considerable and taxing effort. Other absences are for medical reasons / religious services and are infrequent or of short duration when for other reasons). If current dressing causes regression in wound condition, may D/C ordered dressing product/s and apply Normal Saline Moist Dressing daily until next Wound Healing Center / Other MD appointment. Notify Wound Healing Center of regression in wound condition at [phone]. Please direct any NON-WOUND related issues/requests for orders to patient's Primary Care Physician I'm going to recommend that we continue with the Current wound care measures for the next week. Patient seems to be doing well with the Prisma and I'm hopeful that she will continue to do so. Please see above for specific wound care orders. We will see patient for re-evaluation in 1 week(s) here in the clinic. If anything worsens or changes patient will contact our office for additional recommendations. Electronic Signature(s) Signed: 08/08/2017 5:43:16 PM By: Lenda Kelp PA-C Previous Signature: 07/25/2017 4:49:26 PM Version By: Lenda Kelp PA-C Entered By: Lenda Kelp on 08/08/2017 13:12:31 [name] [name]  ([id number]) -------------------------------------------------------------------------------- ROS/PFSH Details Patient Name: [name] [name] Date of Service: 07/25/2017 9:15 AM Medical Record Number: [id number] Patient Account Number: [id number] Date of Birth/Sex: 1926/04/05 (84 y.o. Female) Treating RN: Renne Crigler Primary Care Provider: Barbette Reichmann Other Clinician: Referring Provider: Barbette Reichmann Treating Provider/Extender: Linwood Dibbles, Spencer Cardinal Weeks in Treatment: 9 Information Obtained From Patient Wound History Do you currently have one or more open woundso Yes How many open wounds do you currently haveo 2 Approximately how long have you had your woundso 5 months How have you been treating your wound(s) until nowo camphil Has your wound(s) ever healed and then re-openedo No Have you had any lab work done in the past montho No Have you tested positive for an antibiotic resistant organism (MRSA, VRE)o No Have you tested positive for osteomyelitis (bone infection)o No Have you had any tests for circulation on your legso No Have you had other problems associated with your woundso Swelling Constitutional Symptoms (General Health) Complaints and Symptoms: Negative for: Fever; Chills Cardiovascular Complaints and Symptoms: Positive for: LE edema Medical History: Positive for: Arrhythmia - a fib; Congestive Heart Failure; Hypertension Negative for: Angina; Coronary Artery Disease; Deep Vein Thrombosis; Hypotension; Myocardial Infarction; Peripheral Arterial Disease; Peripheral Venous Disease; Phlebitis; Vasculitis Eyes Medical History: Positive for: Cataracts - removed Past Medical History Notes: macular degeneration Hematologic/Lymphatic Medical History: Positive for: Anemia Negative for: Hemophilia; Human Immunodeficiency Virus; Lymphedema; Sickle Cell Disease Respiratory Complaints and Symptoms: No Complaints or Symptoms Medical History: Negative for:  Aspiration; Asthma; Chronic Obstructive Pulmonary Disease (COPD); Pneumothorax; Sleep Apnea; [name] [name], [name] [name] ([id number]) Tuberculosis Gastrointestinal Medical History: Negative for: Cirrhosis ; Colitis; Crohnos; Hepatitis A; Hepatitis B; Hepatitis C Endocrine Medical History: Positive for: Type II Diabetes Treated with: Insulin Immunological Medical History: Negative for: Lupus Erythematosus; Raynaudos; Scleroderma Musculoskeletal Medical History: Negative for: Gout; Rheumatoid Arthritis; Osteoarthritis; Osteomyelitis Neurologic Medical History: Negative for: Dementia; Neuropathy Oncologic Medical History: Negative for: Received Chemotherapy; Received [facility] Complaints and Symptoms: No Complaints or Symptoms HBO Extended History Items Eyes: Cataracts Immunizations Pneumococcal Vaccine: Received Pneumococcal Vaccination: Yes Immunization Notes: up to date Implantable Devices Family and Social History Cancer: Yes - Child; Diabetes: No;  Heart Disease: Yes - Mother; Hereditary Spherocytosis: No; Hypertension: No; Kidney Disease: No; Lung Disease: No; Seizures: No; Stroke: No; Thyroid Problems: No; Tuberculosis: No; Former smoker - quit 60 years ago; Marital Status - Widowed; Alcohol Use: Never; Drug Use: No History; Caffeine Use: Daily; Financial Concerns: No; Food, Clothing or Shelter Needs: No; Support System Lacking: No; Transportation Concerns: No; Advanced Directives: No; Patient does not want information on Advanced Directives Physician [name] [name], [name] [name] ([id number]) I have reviewed and agree with the above information. Electronic Signature(s) Signed: 07/25/2017 4:43:00 PM By: Renne Crigler Signed: 07/25/2017 4:49:26 PM By: Lenda Kelp PA-C Entered By: Lenda Kelp on 07/25/2017 14:11:18 [place], [name] [name] ([id number]) -------------------------------------------------------------------------------- SuperBill Details Patient Name:  [name] [name] Date of Service: 07/25/2017 Medical Record Number: [id number] Patient Account Number: [id number] Date of Birth/Sex: [birth year] (84 y.o. 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 I50.22 Chronic systolic (congestive) heart failure N18.3 Chronic kidney disease, stage 3 (moderate) Facility Procedures CPT4 Code: 08657846 Description: 96295 - WOUND CARE VISIT-LEV 2 EST PT Modifier: Quantity: 1 Physician Procedures CPT4 Code: [id number] Description: 99213 - WC PHYS LEVEL 3 - EST PT ICD-10 Diagnosis Description E11.622 Type 2 diabetes mellitus with other skin ulcer I89.0 Lymphedema, not elsewhere classified L97.222 Non-pressure chronic ulcer of left calf with fat layer expo L97.212  Non-pressure chronic ulcer of right calf with fat layer exp Modifier: sed osed Quantity: 1 Electronic Signature(s) Signed: 07/25/2017 4:49:26 PM By: Lenda Kelp PA-C Entered By: Lenda Kelp on 07/25/2017 14:13:31

## 2017-08-01 ENCOUNTER — Encounter: Payer: Medicare Other | Admitting: Physician Assistant

## 2017-08-01 DIAGNOSIS — E11622 Type 2 diabetes mellitus with other skin ulcer: Secondary | ICD-10-CM | POA: Diagnosis not present

## 2017-08-02 NOTE — Progress Notes (Signed)
[name] [name], Emelin ([id number]) Visit Report for 08/01/2017 Chief Complaint Document Details Patient Name: [name] [name], [name] [name] Date of Service: 08/01/2017 10:15 AM Medical Record Number: [id number] Patient Account Number: [id number] Date of Birth/Sex: 1925-12-17 (84 y.o. Female) Treating RN: Curtis Sitesorthy, Joanna Primary Care Provider: Barbette ReichmannHande, Vishwanath Other Clinician: Referring Provider: Barbette ReichmannHande, Vishwanath Treating Provider/Extender: Linwood DibblesSTONE III, Tarig Zimmers Weeks in Treatment: 10 Information Obtained from: Patient Chief Complaint Patient presents to the wound care center for a consult due non healing wound to the left lower extremity with bilateral massive swelling of the legs Electronic Signature(s) Signed: 08/01/2017 5:25:11 PM By: Lenda KelpStone III, Kyleena Scheirer PA-C Entered By: Lenda KelpStone III, Shaqueena Mauceri on 08/01/2017 10:16:24 [name] [name], [name] [name] ([id number]) -------------------------------------------------------------------------------- HPI Details Patient Name: [name] [name], [name] [name] Date of Service: 08/01/2017 10:15 AM Medical Record Number: [id number] Patient Account Number: [id number] Date of Birth/Sex: 1925-12-17 (84 y.o. Female) Treating RN: Curtis Sitesorthy, Joanna Primary Care Provider: Barbette ReichmannHande, Vishwanath Other Clinician: Referring Provider: Barbette ReichmannHande, Vishwanath Treating Provider/Extender: Linwood DibblesSTONE III, [place] Freyre Weeks in Treatment: 10 History of Present Illness HPI Description: 84 year old patient seen by her PCP and referred to our center for left lower extremity stasis ulceration. She is known to have diabetes mellitus type 2, hypertension,status post cholecystectomy and open heart surgery, and atrial fibrillation and has bilateral lower extremity lymphedema with oozing of fluid. Her most recent hemoglobin A1c was 7.2. after assessment by the PCP she was put on torsemide 20 mg daily, echo was noted to have an ejection fraction of more than 55% with moderate MR, and the patient was maintained on warfarin for atrial fibrillation. the patient has been  living in [name] about a year but has been traveling a bit and has not had any arterial or venous studies done recently. Her last echo was about a year and a half ago. 05/27/2017 -- she has not had any dressing changes since the last time she was here as a home health did not turn up. Her arterial duplex study is scheduled for next week. 06/03/2017 -- -- had a lower extremity arterial study done and there was arterial wall calcification and the resting ABI was noncompressible bilaterally. The right and left toe brachial indices were abnormal with the left first toe pressure being 76 mmHg and the right toe pressure was 89 mmHg. The right toe brachial index was 0.55 and the left toe brachial index was 0.47. 07/04/17 on evaluation today patient appears to be doing well in regard to her left lower chimney ulcer. She has been tolerating the current treatment plan without any complication. Unfortunately they did get compression hose delivered that were 30-40 mmHg but unfortunately she is unable to wear these. Her daughter was able to find 18 mmHg compression socks at the drugstore which she has been able to tolerate without any complication. This seems like it may be a better option for her. It's not as much compression as I would like but also it's something she's actually able to wear and something is going to be better than nothing. 07/11/17 on evaluation today patient appears to be doing fairly well regard to her left lower extremity ulcer. Unfortunately she does have a new blister on the right lower extremity which has opened at this point. Fortunately there does not appear to be any evidence of infection which is good news. With that being said one of the bigger issues today is that she seems to be having quite a bit of difficulty breathing she has a lot of audible rhonchi even before I listen to her with the stethoscope.  No fevers, chills, nausea, or vomiting noted at this time. She does  seem to be feeling very poorly according to her daughter she's also not walking and she normally would presumably this is due to the fact that she is short of breath. 07/25/17 patient appears to be showing signs of improvement in regard to left lower extremity ulcer. She has been tolerating the dressing changes without complication. She was placed on Keflex 500 mg three times a day due to cellulitis after evaluation by home health around Wednesday of last week. She is still taking [place]. Other than that her blood pressure was somewhat low today at 100/42 this was taken manually. I did recommend that she we take this at home with her blood pressure cuff as this is something that her daughter states is normally not that low. With that being said if it remains low they may need to contact patient's primary care provider. 07/29/17 on evaluation today patient appears to be doing fairly well in regard to her right lateral lower extremity ulcer. The only thing that was really noticed was that the Prisma was somewhat stuck to the wound bed. There was quite a bit of dressing overlapping the actual wound site. This may be part of the issue. Nonetheless once this was cleaned off the wound appear to be much smaller this is excellent news. No fevers chills noted Electronic Signature(s) Signed: 08/01/2017 5:25:11 PM By: Lenda Kelp PA-C Entered By: Lenda Kelp on 08/01/2017 11:26:57 [name] [name], [name] [name] ([id number]) [name] [name], [name] [name] ([id number]) -------------------------------------------------------------------------------- Physical Exam Details Patient Name: [name] [name] Date of Service: 08/01/2017 10:15 AM Medical Record Number: [id number] Patient Account Number: [id number] Date of Birth/Sex: 28-Jan-1926 (84 y.o. Female) Treating RN: Curtis Sites Primary Care Provider: Barbette Reichmann Other Clinician: Referring Provider: Barbette Reichmann Treating Provider/Extender: STONE III, Mckena Chern Weeks in Treatment:  10 Constitutional Well-nourished and well-hydrated in no acute distress. Respiratory normal breathing without difficulty. clear to auscultation bilaterally. Cardiovascular no bruits with no significant JVD. trace pitting edema of the bilateral lower extremities. Psychiatric this patient is able to make decisions and demonstrates good insight into disease process. Alert and Oriented x 3. pleasant and cooperative. Notes Patient's wound did not require any sharp debridement today I was able to remove the dressing with just saline and gauze today. Patient tolerated this with some discomfort although this was minimal and after the dressing was removed and she was sprayed with lidocaine sprayed she actually did do much better than was not having as much discomfort. Electronic Signature(s) Signed: 08/01/2017 5:25:11 PM By: Lenda Kelp PA-C Entered By: Lenda Kelp on 08/01/2017 11:27:48 [name] [name] ([id number]) -------------------------------------------------------------------------------- Physician Orders Details Patient Name: [name] [name] Date of Service: 08/01/2017 10:15 AM Medical Record Number: [id number] Patient Account Number: [id number] Date of Birth/Sex: [birth year] (84 y.o. Female) Treating RN: Curtis Sites Primary Care Provider: Barbette Reichmann Other Clinician: Referring Provider: Barbette Reichmann Treating Provider/Extender: Linwood Dibbles, Reata Petrov Weeks in Treatment: 10 Verbal / Phone Orders: No Diagnosis Coding ICD-10 Coding Code Description E11.622 Type 2 diabetes mellitus with other skin ulcer I89.0 Lymphedema, not elsewhere classified L97.222 Non-pressure chronic ulcer of left calf with fat layer exposed L97.212 Non-pressure chronic ulcer of right calf with fat layer exposed I50.22 Chronic systolic (congestive) heart failure N18.3 Chronic kidney disease, stage 3 (moderate) Wound Cleansing Wound #2 Right,Lateral Lower Leg o Clean wound with Normal  Saline. Anesthetic (add to Medication List) Wound #2 Right,Lateral Lower Leg o Topical Lidocaine 4% cream applied to wound bed prior to  debridement (In Clinic Only). o Hurricaine Topical Anesthetic Spray applied to wound bed prior to debridement (In Clinic Only). Primary Wound Dressing Wound #2 Right,Lateral Lower Leg o Prisma Ag Secondary Dressing Wound #2 Right,Lateral Lower Leg o Non-adherent pad - secure lightly with coban - DO NOT WRAP COBAN TIGHTLY Dressing Change Frequency Wound #2 Right,Lateral Lower Leg o Change dressing every other day. Follow-up Appointments Wound #2 Right,Lateral Lower Leg o Return Appointment in 1 week. Edema Control Wound #2 Right,Lateral Lower Leg o Patient to wear own compression stockings o Elevate legs to the level of the heart and pump ankles as often as possible Additional Orders / Instructions Genter, Kinzly ([id number]) Wound #2 Right,Lateral Lower Leg o Increase protein intake. o Other: - Please try to keep blood sugars below 180. Please add over the counter vitamin C, multivitamin and zinc supplements to your diet. Home Health Wound #2 Right,Lateral Lower Leg o Continue Home Health Visits - [facility] o Home Health Nurse may visit PRN to address patientos wound care needs. o FACE TO FACE ENCOUNTER: MEDICARE and MEDICAID PATIENTS: I certify that this patient is under my care and that I had a face-to-face encounter that meets the physician face-to-face encounter requirements with this patient on this date. The encounter with the patient was in whole or in part for the following MEDICAL CONDITION: (primary reason for Home Healthcare) MEDICAL NECESSITY: I certify, that based on my findings, NURSING services are a medically necessary home health service. HOME BOUND STATUS: I certify that my clinical findings support that this patient is homebound (i.e., Due to illness or injury, pt requires aid of supportive devices  such as crutches, cane, wheelchairs, walkers, the use of special transportation or the assistance of another person to leave their place of residence. There is a normal inability to leave the home and doing so requires considerable and taxing effort. Other absences are for medical reasons / religious services and are infrequent or of short duration when for other reasons). o If current dressing causes regression in wound condition, may D/C ordered dressing product/s and apply Normal Saline Moist Dressing daily until next Wound Healing Center / Other MD appointment. Notify Wound Healing Center of regression in wound condition at [phone]. o Please direct any NON-WOUND related issues/requests for orders to patient's Primary Care Physician Electronic Signature(s) Signed: 08/01/2017 4:44:52 PM By: Curtis Sites Signed: 08/01/2017 5:25:11 PM By: Lenda Kelp PA-C Entered By: Curtis Sites on 08/01/2017 10:34:25 [name] [name] ([id number]) -------------------------------------------------------------------------------- Problem List Details Patient Name: [name] [name] Date of Service: 08/01/2017 10:15 AM Medical Record Number: [id number] Patient Account Number: [id number] Date of Birth/Sex: [birth year] (84 y.o. Female) Treating RN: Curtis Sites Primary Care Provider: Barbette Reichmann Other Clinician: Referring Provider: Barbette Reichmann Treating Provider/Extender: Linwood Dibbles, Sherece Gambrill Weeks in Treatment: 10 Active Problems ICD-10 Encounter Code Description Active Date Diagnosis E11.622 Type 2 diabetes mellitus with other skin ulcer 05/19/2017 Yes I89.0 Lymphedema, not elsewhere classified 05/19/2017 Yes L97.222 Non-pressure chronic ulcer of left calf with fat layer exposed 05/19/2017 Yes L97.212 Non-pressure chronic ulcer of right calf with fat layer exposed 07/11/2017 Yes I50.22 Chronic systolic (congestive) heart failure 05/19/2017 Yes N18.3 Chronic kidney disease, stage 3  (moderate) 05/19/2017 Yes Inactive Problems Resolved Problems Electronic Signature(s) Signed: 08/01/2017 5:25:11 PM By: Lenda Kelp PA-C Entered By: Lenda Kelp on 08/01/2017 10:16:12 [name] [name] ([id number]) -------------------------------------------------------------------------------- Progress Note Details Patient Name: [name] [name] Date of Service: 08/01/2017 10:15 AM Medical Record Number: [id number] Patient Account Number: [id number] Date of Birth/Sex: [birth year] (  84 y.o. Female) Treating RN: Curtis Sites Primary Care Provider: Barbette Reichmann Other Clinician: Referring Provider: Barbette Reichmann Treating Provider/Extender: Linwood Dibbles, Breck Hollinger Weeks in Treatment: 10 Subjective Chief Complaint Information obtained from Patient Patient presents to the wound care center for a consult due non healing wound to the left lower extremity with bilateral massive swelling of the legs History of Present Illness (HPI) 84 year old patient seen by her PCP and referred to our center for left lower extremity stasis ulceration. She is known to have diabetes mellitus type 2, hypertension,status post cholecystectomy and open heart surgery, and atrial fibrillation and has bilateral lower extremity lymphedema with oozing of fluid. Her most recent hemoglobin A1c was 7.2. after assessment by the PCP she was put on torsemide 20 mg daily, echo was noted to have an ejection fraction of more than 55% with moderate MR, and the patient was maintained on warfarin for atrial fibrillation. the patient has been living in [place] for about a year but has been traveling a bit and has not had any arterial or venous studies done recently. Her last echo was about a year and a half ago. 05/27/2017 -- she has not had any dressing changes since the last time she was here as a home health did not turn up. Her arterial duplex study is scheduled for next week. 06/03/2017 -- -- had a lower extremity  arterial study done and there was arterial wall calcification and the resting ABI was noncompressible bilaterally. The right and left toe brachial indices were abnormal with the left first toe pressure being 76 mmHg and the right toe pressure was 89 mmHg. The right toe brachial index was 0.55 and the left toe brachial index was 0.47. 07/04/17 on evaluation today patient appears to be doing well in regard to her left lower chimney ulcer. She has been tolerating the current treatment plan without any complication. Unfortunately they did get compression hose delivered that were 30-40 mmHg but unfortunately she is unable to wear these. Her daughter was able to find 18 mmHg compression socks at the drugstore which she has been able to tolerate without any complication. This seems like it may be a better option for her. It's not as much compression as I would like but also it's something she's actually able to wear and something is going to be better than nothing. 07/11/17 on evaluation today patient appears to be doing fairly well regard to her left lower extremity ulcer. Unfortunately she does have a new blister on the right lower extremity which has opened at this point. Fortunately there does not appear to be any evidence of infection which is good news. With that being said one of the bigger issues today is that she seems to be having quite a bit of difficulty breathing she has a lot of audible rhonchi even before I listen to her with the stethoscope. No fevers, chills, nausea, or vomiting noted at this time. She does seem to be feeling very poorly according to her daughter she's also not walking and she normally would presumably this is due to the fact that she is short of breath. 07/25/17 patient appears to be showing signs of improvement in regard to left lower extremity ulcer. She has been tolerating the dressing changes without complication. She was placed on Keflex 500 mg three times a day due to  cellulitis after evaluation by home health around Wednesday of last week. She is still taking [place]. Other than that her blood pressure was somewhat low  today at 100/42 this was taken manually. I did recommend that she we take this at home with her blood pressure cuff as this is something that her daughter states is normally not that low. With that being said if it remains low they may need to contact patient's primary care provider. 07/29/17 on evaluation today patient appears to be doing fairly well in regard to her right lateral lower extremity ulcer. The only thing that was really noticed was that the Prisma was somewhat stuck to the wound bed. There was quite a bit of dressing [name] [name], [name] [name] ([id number]) overlapping the actual wound site. This may be part of the issue. Nonetheless once this was cleaned off the wound appear to be much smaller this is excellent news. No fevers chills noted Patient History Information obtained from Patient. Family History Cancer - Child, Heart Disease - Mother, No family history of Diabetes, Hereditary Spherocytosis, Hypertension, Kidney Disease, Lung Disease, Seizures, Stroke, Thyroid Problems, Tuberculosis. Social History Former smoker - quit 60 years ago, Marital Status - Widowed, Alcohol Use - Never, Drug Use - No History, Caffeine Use - Daily. Medical And Surgical History Notes Eyes macular degeneration Review of Systems (ROS) Constitutional Symptoms (General Health) Denies complaints or symptoms of Fever, Chills. Respiratory The patient has no complaints or symptoms. Cardiovascular The patient has no complaints or symptoms. Psychiatric The patient has no complaints or symptoms. Objective Constitutional Well-nourished and well-hydrated in no acute distress. Vitals Time Taken: 10:18 AM, Height: 63 in, Weight: 212 lbs, BMI: 37.6, Temperature: 97.8 F, Pulse: 65 bpm, Respiratory Rate: 22 breaths/min, Blood Pressure: 108/52  mmHg. Respiratory normal breathing without difficulty. clear to auscultation bilaterally. Cardiovascular no bruits with no significant JVD. trace pitting edema of the bilateral lower extremities. Psychiatric this patient is able to make decisions and demonstrates good insight into disease process. Alert and Oriented x 3. pleasant and cooperative. General Notes: Patient's wound did not require any sharp debridement today I was able to remove the dressing with just [name] [name], [name] [name] ([id number]) saline and gauze today. Patient tolerated this with some discomfort although this was minimal and after the dressing was removed and she was sprayed with lidocaine sprayed she actually did do much better than was not having as much discomfort. Integumentary (Hair, Skin) Wound #2 status is Open. Original cause of wound was Blister. The wound is located on the Right,Lateral Lower Leg. The wound measures 0.5cm length x 0.4cm width x 0.1cm depth; 0.157cm^2 area and 0.016cm^3 volume. There is no tunneling or undermining noted. There is a large amount of serous drainage noted. The wound margin is flat and intact. There is large (67-100%) pink granulation within the wound bed. There is no necrotic tissue within the wound bed. The periwound skin appearance did not exhibit: Callus, Crepitus, Excoriation, Induration, Rash, Scarring, Dry/Scaly, Maceration, Atrophie Blanche, Cyanosis, Ecchymosis, Hemosiderin Staining, Mottled, Pallor, Rubor, Erythema. Periwound temperature was noted as No Abnormality. The periwound has tenderness on palpation. Assessment Active Problems ICD-10 E11.622 - Type 2 diabetes mellitus with other skin ulcer I89.0 - Lymphedema, not elsewhere classified L97.222 - Non-pressure chronic ulcer of left calf with fat layer exposed L97.212 - Non-pressure chronic ulcer of right calf with fat layer exposed I50.22 - Chronic systolic (congestive) heart failure N18.3 - Chronic kidney disease, stage 3  (moderate) Plan Wound Cleansing: Wound #2 Right,Lateral Lower Leg: Clean wound with Normal Saline. Anesthetic (add to Medication List): Wound #2 Right,Lateral Lower Leg: Topical Lidocaine 4% cream applied to wound bed prior to debridement (In Clinic  Only). Hurricaine Topical Anesthetic Spray applied to wound bed prior to debridement (In Clinic Only). Primary Wound Dressing: Wound #2 Right,Lateral Lower Leg: Prisma Ag Secondary Dressing: Wound #2 Right,Lateral Lower Leg: Non-adherent pad - secure lightly with coban - DO NOT WRAP COBAN TIGHTLY Dressing Change Frequency: Wound #2 Right,Lateral Lower Leg: Change dressing every other day. Follow-up Appointments: Wound #2 Right,Lateral Lower Leg: Return Appointment in 1 week. Edema Control: Wound #2 Right,Lateral Lower Leg: Patient to wear own compression stockings Elevate legs to the level of the heart and pump ankles as often as possible [name] [name], [name] [name] ([id number]) Additional Orders / Instructions: Wound #2 Right,Lateral Lower Leg: Increase protein intake. Other: - Please try to keep blood sugars below 180. Please add over the counter vitamin C, multivitamin and zinc supplements to your diet. Home Health: Wound #2 Right,Lateral Lower Leg: Continue Home Health Visits - [facility] Nurse may visit PRN to address patient s wound care needs. FACE TO FACE ENCOUNTER: MEDICARE and MEDICAID PATIENTS: I certify that this patient is under my care and that I had a face-to-face encounter that meets the physician face-to-face encounter requirements with this patient on this date. The encounter with the patient was in whole or in part for the following MEDICAL CONDITION: (primary reason for Home Healthcare) MEDICAL NECESSITY: I certify, that based on my findings, NURSING services are a medically necessary home health service. HOME BOUND STATUS: I certify that my clinical findings support that this patient is homebound (i.e., Due  to illness or injury, pt requires aid of supportive devices such as crutches, cane, wheelchairs, walkers, the use of special transportation or the assistance of another person to leave their place of residence. There is a normal inability to leave the home and doing so requires considerable and taxing effort. Other absences are for medical reasons / religious services and are infrequent or of short duration when for other reasons). If current dressing causes regression in wound condition, may D/C ordered dressing product/s and apply Normal Saline Moist Dressing daily until next Wound Healing Center / Other MD appointment. Notify Wound Healing Center of regression in wound condition at [phone]. Please direct any NON-WOUND related issues/requests for orders to patient's Primary Care Physician I am going to recommend that we continue with the Current wound care measures although I also suggested that we try and cut just very small pieces of the Prisma to pretty much match the wound size and not have much overlap so that hopefully this will not stick as severely to the patient's skin. Patient's daughter is in agreement with the plan. We will see were things stand in one weeks time. Please see above for specific wound care orders. We will see patient for re-evaluation in 1 week(s) here in the clinic. If anything worsens or changes patient will contact our office for additional recommendations. Electronic Signature(s) Signed: 08/01/2017 5:25:11 PM By: Lenda Kelp PA-C Entered By: Lenda Kelp on 08/01/2017 11:28:21 [name] [name], [name] [name] ([id number]) -------------------------------------------------------------------------------- ROS/PFSH Details Patient Name: [name] [name] Date of Service: 08/01/2017 10:15 AM Medical Record Number: [id number] Patient Account Number: [id number] Date of Birth/Sex: 07/12/1925 (84 y.o. Female) Treating RN: Curtis Sites Primary Care Provider: Barbette Reichmann  Other Clinician: Referring Provider: Barbette Reichmann Treating Provider/Extender: Linwood Dibbles, Fardeen Steinberger Weeks in Treatment: 10 Information Obtained From Patient Wound History Do you currently have one or more open woundso Yes How many open wounds do you currently haveo 2 Approximately how long have you had your woundso 5 months How have you  been treating your wound(s) until nowo camphil Has your wound(s) ever healed and then re-openedo No Have you had any lab work done in the past montho No Have you tested positive for an antibiotic resistant organism (MRSA, VRE)o No Have you tested positive for osteomyelitis (bone infection)o No Have you had any tests for circulation on your legso No Have you had other problems associated with your woundso Swelling Constitutional Symptoms (General Health) Complaints and Symptoms: Negative for: Fever; Chills Eyes Medical History: Positive for: Cataracts - removed Past Medical History Notes: macular degeneration Hematologic/Lymphatic Medical History: Positive for: Anemia Negative for: Hemophilia; Human Immunodeficiency Virus; Lymphedema; Sickle Cell Disease Respiratory Complaints and Symptoms: No Complaints or Symptoms Medical History: Negative for: Aspiration; Asthma; Chronic Obstructive Pulmonary Disease (COPD); Pneumothorax; Sleep Apnea; Tuberculosis Cardiovascular Complaints and Symptoms: No Complaints or Symptoms Medical History: Positive for: Arrhythmia - a fib; Congestive Heart Failure; Hypertension Negative for: Angina; Coronary Artery Disease; Deep Vein Thrombosis; Hypotension; Myocardial Infarction; Peripheral [name] [name], [name] [name] ([id number]) Arterial Disease; Peripheral Venous Disease; Phlebitis; Vasculitis Gastrointestinal Medical History: Negative for: Cirrhosis ; Colitis; Crohnos; Hepatitis A; Hepatitis B; Hepatitis C Endocrine Medical History: Positive for: Type II Diabetes Treated with: Insulin Immunological Medical  History: Negative for: Lupus Erythematosus; Raynaudos; Scleroderma Musculoskeletal Medical History: Negative for: Gout; Rheumatoid Arthritis; Osteoarthritis; Osteomyelitis Neurologic Medical History: Negative for: Dementia; Neuropathy Oncologic Medical History: Negative for: Received Chemotherapy; Received Radiation Psychiatric Complaints and Symptoms: No Complaints or Symptoms HBO Extended History Items Eyes: Cataracts Immunizations Pneumococcal Vaccine: Received Pneumococcal Vaccination: Yes Immunization Notes: up to date Implantable Devices Family and Social History Cancer: Yes - Child; Diabetes: No; Heart Disease: Yes - Mother; Hereditary Spherocytosis: No; Hypertension: No; Kidney Disease: No; Lung Disease: No; Seizures: No; Stroke: No; Thyroid Problems: No; Tuberculosis: No; Former smoker - quit 60 years ago; Marital Status - Widowed; Alcohol Use: Never; Drug Use: No History; Caffeine Use: Daily; Financial Concerns: No; Food, Clothing or Shelter Needs: No; Support System Lacking: No; Transportation Concerns: No; Advanced Directives: No; Patient does not want information on Advanced Directives Physician [name] [name], BRENNER ([id number]) I have reviewed and agree with the above information. Electronic Signature(s) Signed: 08/01/2017 4:44:52 PM By: Curtis Sites Signed: 08/01/2017 5:25:11 PM By: Lenda Kelp PA-C Entered By: Lenda Kelp on 08/01/2017 11:27:24 [name] [name] ([id number]) -------------------------------------------------------------------------------- SuperBill Details Patient Name: [name] [name] Date of Service: 08/01/2017 Medical Record Number: [id number] Patient Account Number: [id number] Date of Birth/Sex: 13-Aug-1925 (84 y.o. Female) Treating RN: Curtis Sites Primary Care Provider: Barbette Reichmann Other Clinician: Referring Provider: Barbette Reichmann Treating Provider/Extender: Linwood Dibbles, Taleia Sadowski Weeks in Treatment: 10 Diagnosis  Coding ICD-10 Codes Code Description E11.622 Type 2 diabetes mellitus with other skin ulcer I89.0 Lymphedema, not elsewhere classified L97.222 Non-pressure chronic ulcer of left calf with fat layer exposed L97.212 Non-pressure chronic ulcer of right calf with fat layer exposed I50.22 Chronic systolic (congestive) heart failure N18.3 Chronic kidney disease, stage 3 (moderate) Facility Procedures CPT4 Code: 62952841 Description: 99213 - WOUND CARE VISIT-LEV 3 EST PT Modifier: Quantity: 1 Physician Procedures CPT4 Code: 3244010 Description: 99213 - WC PHYS LEVEL 3 - EST PT ICD-10 Diagnosis Description E11.622 Type 2 diabetes mellitus with other skin ulcer I89.0 Lymphedema, not elsewhere classified L97.212 Non-pressure chronic ulcer of right calf with fat layer exp Modifier: osed Quantity: 1 Electronic Signature(s) Signed: 08/01/2017 5:25:11 PM By: Lenda Kelp PA-C Entered By: Lenda Kelp on 08/01/2017 11:29:18

## 2017-08-02 NOTE — Progress Notes (Signed)
[name] [name], [name] [name] ([id number]) Visit Report for 08/01/2017 Arrival Information Details Patient Name: [name] [name], [name] [name] Date of Service: 08/01/2017 10:15 AM Medical Record Number: [id number] Patient Account Number: [id number] Date of Birth/Sex: [birth year] (84 y.o. Female) Treating RN: Curtis Sites Primary Care Le Faulcon: Barbette Reichmann Other Clinician: Referring Jakhiya Brower: Barbette Reichmann Treating Sedra Morfin/Extender: Linwood Dibbles, HOYT Weeks in Treatment: 10 Visit Information History Since Last Visit Added or deleted any medications: No Patient Arrived: Wheel Chair Any new allergies or adverse reactions: No Arrival Time: 10:17 Had a fall or experienced change in No Accompanied By: dtr activities of daily living that may affect Transfer Assistance: None risk of falls: Patient Identification Verified: Yes Signs or symptoms of abuse/neglect since last visito No Secondary Verification Process Yes Hospitalized since last visit: No Completed: Has Dressing in Place as Prescribed: Yes Patient Requires Transmission-Based No Has Compression in Place as Prescribed: Yes Precautions: Pain Present Now: No Patient Has Alerts: Yes Patient Alerts: Patient on Blood Thinner DMII warfarin Electronic Signature(s) Signed: 08/01/2017 4:44:52 PM By: Curtis Sites Entered By: Curtis Sites on 08/01/2017 10:17:22 [name] [name] ([id number]) -------------------------------------------------------------------------------- Clinic Level of Care Assessment Details Patient Name: [name] [name] Date of Service: 08/01/2017 10:15 AM Medical Record Number: [id number] Patient Account Number: [id number] Date of Birth/Sex: [birth year] (84 y.o. Female) Treating RN: Curtis Sites Primary Care Mahesh Sizemore: Barbette Reichmann Other Clinician: Referring Ivery Michalski: Barbette Reichmann Treating Shamira Toutant/Extender: Linwood Dibbles, HOYT Weeks in Treatment: 10 Clinic Level of Care Assessment Items TOOL 4 Quantity Score []  - Use  when only an EandM is performed on FOLLOW-UP visit 0 ASSESSMENTS - Nursing Assessment / Reassessment X - Reassessment of Co-morbidities (includes updates in patient status) 1 10 X- 1 5 Reassessment of Adherence to Treatment Plan ASSESSMENTS - Wound and Skin Assessment / Reassessment X - Simple Wound Assessment / Reassessment - one wound 1 5 []  - 0 Complex Wound Assessment / Reassessment - multiple wounds []  - 0 Dermatologic / Skin Assessment (not related to wound area) ASSESSMENTS - Focused Assessment []  - Circumferential Edema Measurements - multi extremities 0 []  - 0 Nutritional Assessment / Counseling / Intervention X- 1 5 Lower Extremity Assessment (monofilament, tuning fork, pulses) []  - 0 Peripheral Arterial Disease Assessment (using hand held doppler) ASSESSMENTS - Ostomy and/or Continence Assessment and Care []  - Incontinence Assessment and Management 0 []  - 0 Ostomy Care Assessment and Management (repouching, etc.) PROCESS - Coordination of Care X - Simple Patient / Family Education for ongoing care 1 15 []  - 0 Complex (extensive) Patient / Family Education for ongoing care []  - 0 Staff obtains Chiropractor, Records, Test Results / Process Orders []  - 0 Staff telephones HHA, Nursing Homes / Clarify orders / etc []  - 0 Routine Transfer to another Facility (non-emergent condition) []  - 0 Routine Hospital Admission (non-emergent condition) []  - 0 New Admissions / Manufacturing engineer / Ordering NPWT, Apligraf, etc. []  - 0 Emergency Hospital Admission (emergent condition) X- 1 10 Simple Discharge Coordination [name] [name], [name] [name] ([id number]) []  - 0 Complex (extensive) Discharge Coordination PROCESS - Special Needs []  - Pediatric / Minor Patient Management 0 []  - 0 Isolation Patient Management []  - 0 Hearing / Language / Visual special needs []  - 0 Assessment of Community assistance (transportation, D/C planning, etc.) []  - 0 Additional assistance / Altered  mentation []  - 0 Support Surface(s) Assessment (bed, cushion, seat, etc.) INTERVENTIONS - Wound Cleansing / Measurement X - Simple Wound Cleansing - one wound 1 5 []  - 0 Complex Wound Cleansing - multiple wounds X- 1 5 Wound  Imaging (photographs - any number of wounds) []  - 0 Wound Tracing (instead of photographs) X- 1 5 Simple Wound Measurement - one wound []  - 0 Complex Wound Measurement - multiple wounds INTERVENTIONS - Wound Dressings X - Small Wound Dressing one or multiple wounds 1 10 []  - 0 Medium Wound Dressing one or multiple wounds []  - 0 Large Wound Dressing one or multiple wounds []  - 0 Application of Medications - topical []  - 0 Application of Medications - injection INTERVENTIONS - Miscellaneous []  - External ear exam 0 []  - 0 Specimen Collection (cultures, biopsies, blood, body fluids, etc.) []  - 0 Specimen(s) / Culture(s) sent or taken to Lab for analysis []  - 0 Patient Transfer (multiple staff / Nurse, adult / Similar devices) []  - 0 Simple Staple / Suture removal (25 or less) []  - 0 Complex Staple / Suture removal (26 or more) []  - 0 Hypo / Hyperglycemic Management (close monitor of Blood Glucose) []  - 0 Ankle / Brachial Index (ABI) - do not check if billed separately X- 1 5 Vital Signs [name] [name], [name] [name] ([id number]) Has the patient been seen at the hospital within the last three years: Yes Total Score: 80 Level Of Care: New/Established - Level 3 Electronic Signature(s) Signed: 08/01/2017 4:44:52 PM By: Curtis Sites Entered By: Curtis Sites on 08/01/2017 10:44:55 [name] [name] ([id number]) -------------------------------------------------------------------------------- Encounter Discharge Information Details Patient Name: [name] [name] Date of Service: 08/01/2017 10:15 AM Medical Record Number: [id number] Patient Account Number: [id number] Date of Birth/Sex: 10/07/1925 (84 y.o. Female) Treating RN: Curtis Sites Primary Care Eliot Popper:  Barbette Reichmann Other Clinician: Referring Vern Guerette: Barbette Reichmann Treating Jeren Dufrane/Extender: Linwood Dibbles, HOYT Weeks in Treatment: 10 Encounter Discharge Information Items Discharge Pain Level: 0 Discharge Condition: Stable Ambulatory Status: Wheelchair Discharge Destination: Home Private Transportation: Auto Accompanied By: dtr Schedule Follow-up Appointment: Yes Medication Reconciliation completed and provided No to Patient/Care Ladonya Jerkins: Clinical Summary of Care: Electronic Signature(s) Signed: 08/01/2017 10:45:55 AM By: Curtis Sites Entered By: Curtis Sites on 08/01/2017 10:45:55 [name] [name] ([id number]) -------------------------------------------------------------------------------- Lower Extremity Assessment Details Patient Name: [name] [name] Date of Service: 08/01/2017 10:15 AM Medical Record Number: [id number] Patient Account Number: [id number] Date of Birth/Sex: May 10, 1926 (84 y.o. Female) Treating RN: Curtis Sites Primary Care Dymon Summerhill: Barbette Reichmann Other Clinician: Referring Aleksander Edmiston: Barbette Reichmann Treating Dima Ferrufino/Extender: Linwood Dibbles, HOYT Weeks in Treatment: 10 Vascular Assessment Pulses: Dorsalis Pedis Palpable: [Right:Yes] Posterior Tibial Extremity colors, hair growth, and conditions: Extremity Color: [Right:Hyperpigmented] Hair Growth on Extremity: [Right:No] Temperature of Extremity: [Right:Warm] Capillary Refill: [Right:< 3 seconds] Electronic Signature(s) Signed: 08/01/2017 4:44:52 PM By: Curtis Sites Entered By: Curtis Sites on 08/01/2017 10:24:40 [name] [name] ([id number]) -------------------------------------------------------------------------------- Multi Wound Chart Details Patient Name: [name] [name] Date of Service: 08/01/2017 10:15 AM Medical Record Number: [id number] Patient Account Number: [id number] Date of Birth/Sex: [birth year] (84 y.o. Female) Treating RN: Curtis Sites Primary Care Abaigeal Moomaw:  Barbette Reichmann Other Clinician: Referring Latrica Clowers: Barbette Reichmann Treating Nandini Bogdanski/Extender: Linwood Dibbles, HOYT Weeks in Treatment: 10 Vital Signs Height(in): 63 Pulse(bpm): 65 Weight(lbs): 212 Blood Pressure(mmHg): 108/52 Body Mass Index(BMI): 38 Temperature(F): 97.8 Respiratory Rate 22 (breaths/min): Photos: [2:No Photos] [N/A:N/A] Wound Location: [2:Right Lower Leg - Lateral] [N/A:N/A] Wounding Event: [2:Blister] [N/A:N/A] Primary Etiology: [2:Diabetic Wound/Ulcer of the Lower Extremity] [N/A:N/A] Secondary Etiology: [2:Lymphedema] [N/A:N/A] Comorbid History: [2:Cataracts, Anemia, Arrhythmia, Congestive Heart Failure, Hypertension, Type II Diabetes] [N/A:N/A] Date Acquired: [2:07/08/2017] [N/A:N/A] Weeks of Treatment: [2:3] [N/A:N/A] Wound Status: [2:Open] [N/A:N/A] Measurements L x W x D [2:0.5x0.4x0.1] [N/A:N/A] (cm) Area (cm) : [2:0.157] [N/A:N/A] Volume (cm) : [2:0.016] [N/A:N/A] %  Reduction in Area: [2:44.50%] [N/A:N/A] % Reduction in Volume: [2:42.90%] [N/A:N/A] Classification: [2:Grade 1] [N/A:N/A] Exudate Amount: [2:Large] [N/A:N/A] Exudate Type: [2:Serous] [N/A:N/A] Exudate Color: [2:amber] [N/A:N/A] Wound Margin: [2:Flat and Intact] [N/A:N/A] Granulation Amount: [2:Large (67-100%)] [N/A:N/A] Granulation Quality: [2:Pink] [N/A:N/A] Necrotic Amount: [2:None Present (0%)] [N/A:N/A] Exposed Structures: [2:Fascia: No Fat Layer (Subcutaneous Tissue) Exposed: No Tendon: No Muscle: No Joint: No Bone: No] [N/A:N/A] Epithelialization: [2:Small (1-33%)] [N/A:N/A] Periwound Skin Texture: [2:Excoriation: No Induration: No] [N/A:N/A] Callus: No Crepitus: No Rash: No Scarring: No Periwound Skin Moisture: Maceration: No N/A N/A Dry/Scaly: No Periwound Skin Color: Atrophie Blanche: No N/A N/A Cyanosis: No Ecchymosis: No Erythema: No Hemosiderin Staining: No Mottled: No Pallor: No Rubor: No Temperature: No Abnormality N/A N/A Tenderness on Palpation:  Yes N/A N/A Wound Preparation: Ulcer Cleansing: N/A N/A Rinsed/Irrigated with Saline Topical Anesthetic Applied: Other: lidocaine 4% Treatment Notes Electronic Signature(s) Signed: 08/01/2017 4:44:52 PM By: Curtis Sites Entered By: Curtis Sites on 08/01/2017 10:33:59 [name] [name] ([id number]) -------------------------------------------------------------------------------- Multi-Disciplinary Care Plan Details Patient Name: [name] [name] Date of Service: 08/01/2017 10:15 AM Medical Record Number: [id number] Patient Account Number: [id number] Date of Birth/Sex: [birth year] (84 y.o. Female) Treating RN: Curtis Sites Primary Care Naftali Carchi: Barbette Reichmann Other Clinician: Referring Rondel Episcopo: Barbette Reichmann Treating Christyan Reger/Extender: Linwood Dibbles, HOYT Weeks in Treatment: 10 Active Inactive ` Abuse / Safety / Falls / Self Care Management Nursing Diagnoses: Impaired physical mobility Goals: Patient will not experience any injury related to falls Date Initiated: 05/19/2017 Target Resolution Date: 07/30/2017 Goal Status: Active Interventions: Assess fall risk on admission and as needed Notes: ` Orientation to the Wound Care Program Nursing Diagnoses: Knowledge deficit related to the wound healing center program Goals: Patient/caregiver will verbalize understanding of the Wound Healing Center Program Date Initiated: 05/19/2017 Target Resolution Date: 07/30/2017 Goal Status: Active Interventions: Provide education on orientation to the wound center Notes: ` Wound/Skin Impairment Nursing Diagnoses: Impaired tissue integrity Goals: Ulcer/skin breakdown will heal within 14 weeks Date Initiated: 05/19/2017 Target Resolution Date: 07/30/2017 Goal Status: Active Interventions: MARKI, FREDE ([id number]) Assess patient/caregiver ability to obtain necessary supplies Assess patient/caregiver ability to perform ulcer/skin care regimen upon admission and as needed Assess  ulceration(s) every visit Notes: Electronic Signature(s) Signed: 08/01/2017 4:44:52 PM By: Curtis Sites Entered By: Curtis Sites on 08/01/2017 10:33:09 [name] [name] ([id number]) -------------------------------------------------------------------------------- Pain Assessment Details Patient Name: [name] [name] Date of Service: 08/01/2017 10:15 AM Medical Record Number: [id number] Patient Account Number: [id number] Date of Birth/Sex: August 27, 1925 (84 y.o. Female) Treating RN: Curtis Sites Primary Care Lovette Merta: Barbette Reichmann Other Clinician: Referring Anabela Crayton: Barbette Reichmann Treating Quaran Kedzierski/Extender: Linwood Dibbles, HOYT Weeks in Treatment: 10 Active Problems Location of Pain Severity and Description of Pain Patient Has Paino Yes Site Locations Pain Location: Generalized Pain With Dressing Change: Yes Duration of the Pain. Constant / Intermittento Constant Pain Management and Medication Current Pain Management: Notes Topical or injectable lidocaine is offered to patient for acute pain when surgical debridement is performed. If needed, Patient is instructed to use over the counter pain medication for the following 24-48 hours after debridement. Wound care MDs do not prescribed pain medications. Patient has chronic pain or uncontrolled pain. Patient has been instructed to make an appointment with their Primary Care Physician for pain management. Electronic Signature(s) Signed: 08/01/2017 4:44:52 PM By: Curtis Sites Entered By: Curtis Sites on 08/01/2017 10:18:19 [name] [name] ([id number]) -------------------------------------------------------------------------------- Patient/Caregiver Education Details Patient Name: [name] [name] Date of Service: 08/01/2017 10:15 AM Medical Record Number: [id number] Patient Account Number: [id number] Date of Birth/Gender: 12-16-1925 (84 y.o. Female) Treating  RN: Curtis Sitesorthy, Joanna Primary Care Physician: Barbette ReichmannHande, Vishwanath Other  Clinician: Referring Physician: Barbette ReichmannHande, Vishwanath Treating Physician/Extender: Skeet SimmerSTONE III, HOYT Weeks in Treatment: 10 Education Assessment Education Provided To: Patient and Caregiver Education Topics Provided Wound/Skin Impairment: Handouts: Other: wound care as ordered Methods: Demonstration, Explain/Verbal Responses: State content correctly Electronic Signature(s) Signed: 08/01/2017 4:44:52 PM By: Curtis Sitesorthy, Joanna Entered By: Curtis Sitesorthy, Joanna on 08/01/2017 10:46:13 [name] [name], [name] [name] ([id number]) -------------------------------------------------------------------------------- Wound Assessment Details Patient Name: [name] [name], [name] [name] Date of Service: 08/01/2017 10:15 AM Medical Record Number: [id number] Patient Account Number: [id number] Date of Birth/Sex: 04/16/1926 (84 y.o. Female) Treating RN: Curtis Sitesorthy, Joanna Primary Care Stephen Baruch: Barbette ReichmannHande, Vishwanath Other Clinician: Referring Tamecia Mcdougald: Barbette ReichmannHande, Vishwanath Treating Tristyn Pharris/Extender: Linwood DibblesSTONE III, HOYT Weeks in Treatment: 10 Wound Status Wound Number: 2 Primary Diabetic Wound/Ulcer of the Lower Extremity Etiology: Wound Location: Right Lower Leg - Lateral Secondary Lymphedema Wounding Event: Blister Etiology: Date Acquired: 07/08/2017 Wound Status: Open Weeks Of Treatment: 3 Comorbid Cataracts, Anemia, Arrhythmia, Congestive Clustered Wound: No History: Heart Failure, Hypertension, Type II Diabetes Photos Photo Uploaded By: Curtis Sitesorthy, Joanna on 08/01/2017 10:48:02 Wound Measurements Length: (cm) 0.5 Width: (cm) 0.4 Depth: (cm) 0.1 Area: (cm) 0.157 Volume: (cm) 0.016 % Reduction in Area: 44.5% % Reduction in Volume: 42.9% Epithelialization: Small (1-33%) Tunneling: No Undermining: No Wound Description Classification: Grade 1 Wound Margin: Flat and Intact Exudate Amount: Large Exudate Type: Serous Exudate Color: amber Foul Odor After Cleansing: No Slough/Fibrino Yes Wound Bed Granulation Amount: Large (67-100%) Exposed  Structure Granulation Quality: Pink Fascia Exposed: No Necrotic Amount: None Present (0%) Fat Layer (Subcutaneous Tissue) Exposed: No Tendon Exposed: No Muscle Exposed: No Joint Exposed: No Bone Exposed: No Periwound Skin Texture [name] [name], [name] [name] ([id number]) Texture Color No Abnormalities Noted: No No Abnormalities Noted: No Callus: No Atrophie Blanche: No Crepitus: No Cyanosis: No Excoriation: No Ecchymosis: No Induration: No Erythema: No Rash: No Hemosiderin Staining: No Scarring: No Mottled: No Pallor: No Moisture Rubor: No No Abnormalities Noted: No Dry / Scaly: No Temperature / Pain Maceration: No Temperature: No Abnormality Tenderness on Palpation: Yes Wound Preparation Ulcer Cleansing: Rinsed/Irrigated with Saline Topical Anesthetic Applied: Other: lidocaine 4%, Treatment Notes Wound #2 (Right, Lateral Lower Leg) 1. Cleansed with: Clean wound with Normal Saline 2. Anesthetic Topical Lidocaine 4% cream to wound bed prior to debridement 4. Dressing Applied: Prisma Ag 5. Secondary Dressing Applied Non-Adherent pad Notes coban lightly to secure Electronic Signature(s) Signed: 08/01/2017 4:44:52 PM By: Curtis Sitesorthy, Joanna Entered By: Curtis Sitesorthy, Joanna on 08/01/2017 10:33:03 [name] [name], [name] [name] ([id number]) -------------------------------------------------------------------------------- Vitals Details Patient Name: [name] [name], [name] [name] Date of Service: 08/01/2017 10:15 AM Medical Record Number: [id number] Patient Account Number: [id number] Date of Birth/Sex: 04/16/1926 (84 y.o. Female) Treating RN: Curtis Sitesorthy, Joanna Primary Care Mylo Choi: Barbette ReichmannHande, Vishwanath Other Clinician: Referring Ammy Lienhard: Barbette ReichmannHande, Vishwanath Treating Kaiel Weide/Extender: Linwood DibblesSTONE III, HOYT Weeks in Treatment: 10 Vital Signs Time Taken: 10:18 Temperature (F): 97.8 Height (in): 63 Pulse (bpm): 65 Weight (lbs): 212 Respiratory Rate (breaths/min): 22 Body Mass Index (BMI): 37.6 Blood Pressure (mmHg):  108/52 Reference Range: 80 - 120 mg / dl Electronic Signature(s) Signed: 08/01/2017 4:44:52 PM By: Curtis Sitesorthy, Joanna Entered By: Curtis Sitesorthy, Joanna on 08/01/2017 10:18:53

## 2017-08-08 ENCOUNTER — Encounter: Payer: Medicare Other | Admitting: Physician Assistant

## 2017-08-08 DIAGNOSIS — E11622 Type 2 diabetes mellitus with other skin ulcer: Secondary | ICD-10-CM | POA: Diagnosis not present

## 2017-08-09 NOTE — Progress Notes (Signed)
[name] [name], Davonna ([id number]) Visit Report for 08/08/2017 Chief Complaint Document Details Patient Name: [name] [name], [name] [name] Date of Service: 08/08/2017 11:00 AM Medical Record Number: [id number] Patient Account Number: [id number] Date of Birth/Sex: 1925-11-15 (84 y.o. Female) Treating RN: Huel CoventryWoody, Kim Primary Care Provider: Barbette ReichmannHande, Vishwanath Other Clinician: Referring Provider: Barbette ReichmannHande, Vishwanath Treating Provider/Extender: Linwood DibblesSTONE III, HOYT Weeks in Treatment: 11 Information Obtained from: Patient Chief Complaint Patient presents to the wound care center for a consult due non healing wound to the left lower extremity with bilateral massive swelling of the legs Electronic Signature(s) Signed: 08/08/2017 5:42:41 PM By: Lenda KelpStone III, Hoyt PA-C Entered By: Lenda KelpStone III, Hoyt on 08/08/2017 11:11:15 [name] [name], [name] [name] ([id number]) -------------------------------------------------------------------------------- HPI Details Patient Name: [name] [name], [name] [name] Date of Service: 08/08/2017 11:00 AM Medical Record Number: [id number] Patient Account Number: [id number] Date of Birth/Sex: 1925-11-15 (84 y.o. Female) Treating RN: Huel CoventryWoody, Kim Primary Care Provider: Barbette ReichmannHande, Vishwanath Other Clinician: Referring Provider: Barbette ReichmannHande, Vishwanath Treating Provider/Extender: Linwood DibblesSTONE III, HOYT Weeks in Treatment: 11 History of Present Illness HPI Description: 84 year old patient seen by her PCP and referred to our center for left lower extremity stasis ulceration. She is known to have diabetes mellitus type 2, hypertension,status post cholecystectomy and open heart surgery, and atrial fibrillation and has bilateral lower extremity lymphedema with oozing of fluid. Her most recent hemoglobin A1c was 7.2. after assessment by the PCP she was put on torsemide 20 mg daily, echo was noted to have an ejection fraction of more than 55% with moderate MR, and the patient was maintained on warfarin for atrial fibrillation. [place] but has been traveling a bit and has not had any arterial or venous studies done recently. Her last echo was about a year and a half ago. 05/27/2017 -- she has not had any dressing changes since the last time she was here as a home health did not turn up. Her arterial duplex study is scheduled for next week. 06/03/2017 -- -- had a lower extremity arterial study done and there was arterial wall calcification and the resting ABI was noncompressible bilaterally. The right and left toe brachial indices were abnormal with the left first toe pressure being 76 mmHg and the right toe pressure was 89 mmHg. The right toe brachial index was 0.55 and the left toe brachial index was 0.47. 07/04/17 on evaluation today patient appears to be doing well in regard to her left lower chimney ulcer. She has been tolerating the current treatment plan without any complication. Unfortunately they did get compression hose delivered that were 30-40 mmHg but unfortunately she is unable to wear these. Her daughter was able to find 18 mmHg compression socks at the drugstore which she has been able to tolerate without any complication. This seems like it may be a better option for her. It's not as much compression as I would like but also it's something she's actually able to wear and something is going to be better than nothing. 07/11/17 on evaluation today patient appears to be doing fairly well regard to her left lower extremity ulcer. Unfortunately she does have a new blister on the right lower extremity which has opened at this point. Fortunately there does not appear to be any evidence of infection which is good news. With that being said one of the bigger issues today is that she seems to be having quite a bit of difficulty breathing she has a lot of audible rhonchi even before I listen to her with the stethoscope.  No fevers, chills, nausea, or vomiting noted at this time. She does seem  to be feeling very poorly according to her daughter she's also not walking and she normally would presumably this is due to the fact that she is short of breath. 07/25/17 patient appears to be showing signs of improvement in regard to left lower extremity ulcer. She has been tolerating the dressing changes without complication. She was placed on Keflex 500 mg three times a day due to cellulitis after evaluation by home health around Wednesday of last week. She is still taking [place]. Other than that her blood pressure was somewhat low today at 100/42 this was taken manually. I did recommend that she we take this at home with her blood pressure cuff as this is something that her daughter states is normally not that low. With that being said if it remains low they may need to contact patient's primary care provider. 08/08/17 evaluation today patient's right lateral loads from the ulcer appears to be doing fairly well at this point. There's no evidence of infection at this time. Fortunately he seems to be tolerating very well the Prisma at this point it does not seem to be overlapping the wound bed stock which is definitely good news. Overall I'm very pleased with the progress that she has made up to this point. Today's measurements will not currently smaller than last week but they also did not appear to be worse. Electronic Signature(s) Signed: 08/08/2017 5:42:41 PM By: Buren Kos [place], Leanza ([id number]) Entered By: Lenda Kelp on 08/08/2017 12:48:33 [name] [name], [name] [name] ([id number]) -------------------------------------------------------------------------------- Physical Exam Details Patient Name: [name] [name] Date of Service: 08/08/2017 11:00 AM Medical Record Number: [id number] Patient Account Number: [id number] Date of Birth/Sex: April 22, 1926 (84 y.o. Female) Treating RN: Huel Coventry Primary Care Provider: Barbette Reichmann Other Clinician: Referring Provider: Barbette Reichmann Treating Provider/Extender: Linwood Dibbles, HOYT Weeks in Treatment: 11 Constitutional Chronically ill appearing but in no apparent acute distress. Respiratory normal breathing without difficulty. clear to auscultation bilaterally. Psychiatric this patient is able to make decisions and demonstrates good insight into disease process. Alert and Oriented x 3. pleasant and cooperative. Notes Patient's wound bed appear to show evidence of good granulation at this point and did not appear to be any evidence of infection which is good news. She does have tenderness but again she typically does want to ride him to clean the wound. No debridement was required today. Electronic Signature(s) Signed: 08/08/2017 5:42:41 PM By: Lenda Kelp PA-C Entered By: Lenda Kelp on 08/08/2017 12:49:32 [name] [name], [name] [name] ([id number]) -------------------------------------------------------------------------------- Physician Orders Details Patient Name: [name] [name] Date of Service: 08/08/2017 11:00 AM Medical Record Number: [id number] Patient Account Number: [id number] Date of Birth/Sex: 1925/09/27 (84 y.o. Female) Treating RN: Huel Coventry Primary Care Provider: Barbette Reichmann Other Clinician: Referring Provider: Barbette Reichmann Treating Provider/Extender: Linwood Dibbles, HOYT Weeks in Treatment: 11 Verbal / Phone Orders: No Diagnosis Coding ICD-10 Coding Code Description E11.622 Type 2 diabetes mellitus with other skin ulcer I89.0 Lymphedema, not elsewhere classified L97.222 Non-pressure chronic ulcer of left calf with fat layer exposed L97.212 Non-pressure chronic ulcer of right calf with fat layer exposed I50.22 Chronic systolic (congestive) heart failure N18.3 Chronic kidney disease, stage 3 (moderate) Wound Cleansing Wound #2 Right,Lateral Lower Leg o Clean wound with Normal Saline. Anesthetic (add to Medication List) Wound #2 Right,Lateral Lower Leg o Topical Lidocaine 4% cream  applied to wound bed prior to debridement (In Clinic Only). Primary Wound Dressing Wound #2 Right,Lateral Lower  Leg o Prisma Ag Secondary Dressing Wound #2 Right,Lateral Lower Leg o Non-adherent pad - secure lightly with coban - DO NOT WRAP COBAN TIGHTLY Dressing Change Frequency Wound #2 Right,Lateral Lower Leg o Change dressing every other day. Follow-up Appointments Wound #2 Right,Lateral Lower Leg o Return Appointment in 1 week. Edema Control Wound #2 Right,Lateral Lower Leg o Patient to wear own compression stockings o Elevate legs to the level of the heart and pump ankles as often as possible Additional Orders / Instructions Wound #2 Right,Lateral Lower Leg [name] [name], [name] [name] ([id number]) o Increase protein intake. o Other: - Please try to keep blood sugars below 180. Please add over the counter vitamin C, multivitamin and zinc supplements to your diet. Home Health Wound #2 Right,Lateral Lower Leg o Continue Home Health Visits - [facility] o Home Health Nurse may visit PRN to address patientos wound care needs. o FACE TO FACE ENCOUNTER: MEDICARE and MEDICAID PATIENTS: I certify that this patient is under my care and that I had a face-to-face encounter that meets the physician face-to-face encounter requirements with this patient on this date. The encounter with the patient was in whole or in part for the following MEDICAL CONDITION: (primary reason for Home Healthcare) MEDICAL NECESSITY: I certify, that based on my findings, NURSING services are a medically necessary home health service. HOME BOUND STATUS: I certify that my clinical findings support that this patient is homebound (i.e., Due to illness or injury, pt requires aid of supportive devices such as crutches, cane, wheelchairs, walkers, the use of special transportation or the assistance of another person to leave their place of residence. There is a normal inability to leave the home and doing so  requires considerable and taxing effort. Other absences are for medical reasons / religious services and are infrequent or of short duration when for other reasons). o If current dressing causes regression in wound condition, may D/C ordered dressing product/s and apply Normal Saline Moist Dressing daily until next Wound Healing Center / Other MD appointment. Notify Wound Healing Center of regression in wound condition at [phone]. o Please direct any NON-WOUND related issues/requests for orders to patient's Primary Care Physician Electronic Signature(s) Signed: 08/08/2017 5:42:41 PM By: Lenda Kelp PA-C Signed: 08/09/2017 8:36:08 AM By: Elliot Gurney, BSN, RN, CWS, Kim RN, BSN Entered By: Elliot Gurney, BSN, RN, CWS, Kim on 08/08/2017 11:57:08 [name] [name] ([id number]) -------------------------------------------------------------------------------- Problem List Details Patient Name: [name] [name] Date of Service: 08/08/2017 11:00 AM Medical Record Number: [id number] Patient Account Number: [id number] Date of Birth/Sex: 1925-11-27 (84 y.o. Female) Treating RN: Huel Coventry Primary Care Provider: Barbette Reichmann Other Clinician: Referring Provider: Barbette Reichmann Treating Provider/Extender: Linwood Dibbles, HOYT Weeks in Treatment: 11 Active Problems ICD-10 Encounter Code Description Active Date Diagnosis E11.622 Type 2 diabetes mellitus with other skin ulcer 05/19/2017 Yes I89.0 Lymphedema, not elsewhere classified 05/19/2017 Yes L97.222 Non-pressure chronic ulcer of left calf with fat layer exposed 05/19/2017 Yes L97.212 Non-pressure chronic ulcer of right calf with fat layer exposed 07/11/2017 Yes I50.22 Chronic systolic (congestive) heart failure 05/19/2017 Yes N18.3 Chronic kidney disease, stage 3 (moderate) 05/19/2017 Yes Inactive Problems Resolved Problems Electronic Signature(s) Signed: 08/08/2017 5:42:41 PM By: Lenda Kelp PA-C Entered By: Lenda Kelp on 08/08/2017  11:11:07 [name] [name] ([id number]) -------------------------------------------------------------------------------- Progress Note Details Patient Name: [name] [name] Date of Service: 08/08/2017 11:00 AM Medical Record Number: [id number] Patient Account Number: [id number] Date of Birth/Sex: 1926-03-25 (84 y.o. Female) Treating RN: Huel Coventry Primary Care Provider: Barbette Reichmann Other Clinician: Referring Provider: Barbette Reichmann  Treating Provider/Extender: STONE III, HOYT Weeks in Treatment: 11 Subjective Chief Complaint Information obtained from Patient Patient presents to the wound care center for a consult due non healing wound to the left lower extremity with bilateral massive swelling of the legs History of Present Illness (HPI) 84 year old patient seen by her PCP and referred to our center for left lower extremity stasis ulceration. She is known to have diabetes mellitus type 2, hypertension,status post cholecystectomy and open heart surgery, and atrial fibrillation and has bilateral lower extremity lymphedema with oozing of fluid. Her most recent hemoglobin A1c was 7.2. after assessment by the PCP she was put on torsemide 20 mg daily, echo was noted to have an ejection fraction of more than 55% with moderate MR, and the patient was maintained on warfarin for atrial fibrillation. the patient has been living in [place] for about a year but has been traveling a bit and has not had any arterial or venous studies done recently. Her last echo was about a year and a half ago. 05/27/2017 -- she has not had any dressing changes since the last time she was here as a home health did not turn up. Her arterial duplex study is scheduled for next week. 06/03/2017 -- -- had a lower extremity arterial study done and there was arterial wall calcification and the resting ABI was noncompressible bilaterally. The right and left toe brachial indices were abnormal with the left first toe  pressure being 76 mmHg and the right toe pressure was 89 mmHg. The right toe brachial index was 0.55 and the left toe brachial index was 0.47. 07/04/17 on evaluation today patient appears to be doing well in regard to her left lower chimney ulcer. She has been tolerating the current treatment plan without any complication. Unfortunately they did get compression hose delivered that were 30-40 mmHg but unfortunately she is unable to wear these. Her daughter was able to find 18 mmHg compression socks at the drugstore which she has been able to tolerate without any complication. This seems like it may be a better option for her. It's not as much compression as I would like but also it's something she's actually able to wear and something is going to be better than nothing. 07/11/17 on evaluation today patient appears to be doing fairly well regard to her left lower extremity ulcer. Unfortunately she does have a new blister on the right lower extremity which has opened at this point. Fortunately there does not appear to be any evidence of infection which is good news. With that being said one of the bigger issues today is that she seems to be having quite a bit of difficulty breathing she has a lot of audible rhonchi even before I listen to her with the stethoscope. No fevers, chills, nausea, or vomiting noted at this time. She does seem to be feeling very poorly according to her daughter she's also not walking and she normally would presumably this is due to the fact that she is short of breath. 07/25/17 patient appears to be showing signs of improvement in regard to left lower extremity ulcer. She has been tolerating the dressing changes without complication. She was placed on Keflex 500 mg three times a day due to cellulitis after evaluation by home health around Wednesday of last week. She is still taking [place]. Other than that her blood pressure was somewhat low today at 100/42 this was taken manually. I  did recommend that she we take this at home with  her blood pressure cuff as this is something that her daughter states is normally not that low. With that being said if it remains low they may need to contact patient's primary care provider. 08/08/17 evaluation today patient's right lateral loads from the ulcer appears to be doing fairly well at this point. There's no evidence of infection at this time. Fortunately he seems to be tolerating very well the Prisma at this point it does not seem to [place], Myeesha ([id number]) be overlapping the wound bed stock which is definitely good news. Overall I'm very pleased with the progress that she has made up to this point. Today's measurements will not currently smaller than last week but they also did not appear to be worse. Patient History Information obtained from Patient. Family History Cancer - Child, Heart Disease - Mother, No family history of Diabetes, Hereditary Spherocytosis, Hypertension, Kidney Disease, Lung Disease, Seizures, Stroke, Thyroid Problems, Tuberculosis. Social History Former smoker - quit 60 years ago, Marital Status - Widowed, Alcohol Use - Never, Drug Use - No History, Caffeine Use - Daily. Medical And Surgical History Notes Eyes macular degeneration Review of Systems (ROS) Constitutional Symptoms (General Health) Denies complaints or symptoms of Fever, Chills. Respiratory The patient has no complaints or symptoms. Cardiovascular Complains or has symptoms of LE edema. Psychiatric The patient has no complaints or symptoms. Objective Constitutional Chronically ill appearing but in no apparent acute distress. Vitals Time Taken: 11:30 AM, Height: 63 in, Weight: 212 lbs, BMI: 37.6, Temperature: 98.1 F, Pulse: 67 bpm, Respiratory Rate: 16 breaths/min, Blood Pressure: 108/51 mmHg, Pulse Oximetry: 93 %. General Notes: When patient arrived into room she was short of breath and weezing. Daughter states patient usually  arrived in a wheel chair but patient wanted to walk today. Initial O2 sats were 83-85%. After patient settled down from waking O2 sats increased to 95-96%. Respiratory normal breathing without difficulty. clear to auscultation bilaterally. Psychiatric this patient is able to make decisions and demonstrates good insight into disease process. Alert and Oriented x 3. pleasant and cooperative. [name] [name], [name] [name] ([id number]) General Notes: Patient's wound bed appear to show evidence of good granulation at this point and did not appear to be any evidence of infection which is good news. She does have tenderness but again she typically does want to ride him to clean the wound. No debridement was required today. Integumentary (Hair, Skin) Wound #2 status is Open. Original cause of wound was Blister. The wound is located on the Right,Lateral Lower Leg. The wound measures 0.5cm length x 0.3cm width x 0.1cm depth; 0.118cm^2 area and 0.012cm^3 volume. There is Fat Layer (Subcutaneous Tissue) Exposed exposed. There is no tunneling or undermining noted. There is a medium amount of serous drainage noted. The wound margin is flat and intact. There is large (67-100%) pink granulation within the wound bed. There is no necrotic tissue within the wound bed. The periwound skin appearance exhibited: Dry/Scaly. The periwound skin appearance did not exhibit: Callus, Crepitus, Excoriation, Induration, Rash, Scarring, Maceration, Atrophie Blanche, Cyanosis, Ecchymosis, Hemosiderin Staining, Mottled, Pallor, Rubor, Erythema. Periwound temperature was noted as No Abnormality. The periwound has tenderness on palpation. Assessment Active Problems ICD-10 E11.622 - Type 2 diabetes mellitus with other skin ulcer I89.0 - Lymphedema, not elsewhere classified L97.222 - Non-pressure chronic ulcer of left calf with fat layer exposed L97.212 - Non-pressure chronic ulcer of right calf with fat layer exposed I50.22 - Chronic  systolic (congestive) heart failure N18.3 - Chronic kidney disease, stage 3 (moderate) Plan Wound Cleansing:  Wound #2 Right,Lateral Lower Leg: Clean wound with Normal Saline. Anesthetic (add to Medication List): Wound #2 Right,Lateral Lower Leg: Topical Lidocaine 4% cream applied to wound bed prior to debridement (In Clinic Only). Primary Wound Dressing: Wound #2 Right,Lateral Lower Leg: Prisma Ag Secondary Dressing: Wound #2 Right,Lateral Lower Leg: Non-adherent pad - secure lightly with coban - DO NOT WRAP COBAN TIGHTLY Dressing Change Frequency: Wound #2 Right,Lateral Lower Leg: Change dressing every other day. Follow-up Appointments: Wound #2 Right,Lateral Lower Leg: Return Appointment in 1 week. Edema Control: Wound #2 Right,Lateral Lower Leg: Patient to wear own compression stockings Elevate legs to the level of the heart and pump ankles as often as possible [place], [name] [name] ([id number]) Additional Orders / Instructions: Wound #2 Right,Lateral Lower Leg: Increase protein intake. Other: - Please try to keep blood sugars below 180. Please add over the counter vitamin C, multivitamin and zinc supplements to your diet. Home Health: Wound #2 Right,Lateral Lower Leg: Continue Home Health Visits - [facility] Nurse may visit PRN to address patient s wound care needs. FACE TO FACE ENCOUNTER: MEDICARE and MEDICAID PATIENTS: I certify that this patient is under my care and that I had a face-to-face encounter that meets the physician face-to-face encounter requirements with this patient on this date. The encounter with the patient was in whole or in part for the following MEDICAL CONDITION: (primary reason for Home Healthcare) MEDICAL NECESSITY: I certify, that based on my findings, NURSING services are a medically necessary home health service. HOME BOUND STATUS: I certify that my clinical findings support that this patient is homebound (i.e., Due to illness or injury, pt  requires aid of supportive devices such as crutches, cane, wheelchairs, walkers, the use of special transportation or the assistance of another person to leave their place of residence. There is a normal inability to leave the home and doing so requires considerable and taxing effort. Other absences are for medical reasons / religious services and are infrequent or of short duration when for other reasons). If current dressing causes regression in wound condition, may D/C ordered dressing product/s and apply Normal Saline Moist Dressing daily until next Wound Healing Center / Other MD appointment. Notify Wound Healing Center of regression in wound condition at [phone]. Please direct any NON-WOUND related issues/requests for orders to patient's Primary Care Physician I am going to recommend that we continue with the Current wound care measures for worsens significantly she will contact our office otherwise we will see were things stand in one weeks time. Please see above for specific wound care orders. We will see patient for re-evaluation in 1 week(s) here in the clinic. If anything worsens or changes patient will contact our office for additional recommendations. Electronic Signature(s) Signed: 08/08/2017 5:42:41 PM By: Lenda Kelp PA-C Entered By: Lenda Kelp on 08/08/2017 12:49:52 [name] [name], [name] [name] ([id number]) -------------------------------------------------------------------------------- ROS/PFSH Details Patient Name: [name] [name] Date of Service: 08/08/2017 11:00 AM Medical Record Number: [id number] Patient Account Number: [id number] Date of Birth/Sex: 1926-01-30 (84 y.o. Female) Treating RN: Huel Coventry Primary Care Provider: Barbette Reichmann Other Clinician: Referring Provider: Barbette Reichmann Treating Provider/Extender: Linwood Dibbles, HOYT Weeks in Treatment: 11 Information Obtained From Patient Wound History Do you currently have one or more open woundso Yes How  many open wounds do you currently haveo 2 Approximately how long have you had your woundso 5 months How have you been treating your wound(s) until nowo camphil Has your wound(s) ever healed and then re-openedo No Have you had any lab  work done in the past montho No Have you tested positive for an antibiotic resistant organism (MRSA, VRE)o No Have you tested positive for osteomyelitis (bone infection)o No Have you had any tests for circulation on your legso No Have you had other problems associated with your woundso Swelling Constitutional Symptoms (General Health) Complaints and Symptoms: Negative for: Fever; Chills Cardiovascular Complaints and Symptoms: Positive for: LE edema Medical History: Positive for: Arrhythmia - a fib; Congestive Heart Failure; Hypertension Negative for: Angina; Coronary Artery Disease; Deep Vein Thrombosis; Hypotension; Myocardial Infarction; Peripheral Arterial Disease; Peripheral Venous Disease; Phlebitis; Vasculitis Eyes Medical History: Positive for: Cataracts - removed Past Medical History Notes: macular degeneration Hematologic/Lymphatic Medical History: Positive for: Anemia Negative for: Hemophilia; Human Immunodeficiency Virus; Lymphedema; Sickle Cell Disease Respiratory Complaints and Symptoms: No Complaints or Symptoms Medical History: Negative for: Aspiration; Asthma; Chronic Obstructive Pulmonary Disease (COPD); Pneumothorax; Sleep Apnea; [name] [name], [name] [name] ([id number]) Tuberculosis Gastrointestinal Medical History: Negative for: Cirrhosis ; Colitis; Crohnos; Hepatitis A; Hepatitis B; Hepatitis C Endocrine Medical History: Positive for: Type II Diabetes Treated with: Insulin Immunological Medical History: Negative for: Lupus Erythematosus; Raynaudos; Scleroderma Musculoskeletal Medical History: Negative for: Gout; Rheumatoid Arthritis; Osteoarthritis; Osteomyelitis Neurologic Medical History: Negative for: Dementia;  Neuropathy Oncologic Medical History: Negative for: Received Chemotherapy; Received Radiation Psychiatric Complaints and Symptoms: No Complaints or Symptoms HBO Extended History Items Eyes: Cataracts Immunizations Pneumococcal Vaccine: Received Pneumococcal Vaccination: Yes Immunization Notes: up to date Implantable Devices Family and Social History Cancer: Yes - Child; Diabetes: No; Heart Disease: Yes - Mother; Hereditary Spherocytosis: No; Hypertension: No; Kidney Disease: No; Lung Disease: No; Seizures: No; Stroke: No; Thyroid Problems: No; Tuberculosis: No; Former smoker - quit 60 years ago; Marital Status - Widowed; Alcohol Use: Never; Drug Use: No History; Caffeine Use: Daily; Financial Concerns: No; Food, Clothing or Shelter Needs: No; Support System Lacking: No; Transportation Concerns: No; Advanced Directives: No; Patient does not want information on Advanced Directives Physician KODIE, PICK ([id number]) I have reviewed and agree with the above information. Electronic Signature(s) Signed: 08/08/2017 5:42:41 PM By: Lenda Kelp PA-C Signed: 08/09/2017 8:36:08 AM By: Elliot Gurney BSN, RN, CWS, Kim RN, BSN Entered By: Lenda Kelp on 08/08/2017 12:49:04 MCKYNLEIGH, MUSSELL ([id number]) -------------------------------------------------------------------------------- SuperBill Details Patient Name: [name] [name] Date of Service: 08/08/2017 Medical Record Number: [id number] Patient Account Number: [id number] Date of Birth/Sex: 22-Nov-1925 (84 y.o. Female) Treating RN: Huel Coventry Primary Care Provider: Barbette Reichmann Other Clinician: Referring Provider: Barbette Reichmann Treating Provider/Extender: Linwood Dibbles, HOYT Weeks in Treatment: 11 Diagnosis Coding ICD-10 Codes Code Description E11.622 Type 2 diabetes mellitus with other skin ulcer I89.0 Lymphedema, not elsewhere classified L97.222 Non-pressure chronic ulcer of left calf with fat layer exposed L97.212  Non-pressure chronic ulcer of right calf with fat layer exposed I50.22 Chronic systolic (congestive) heart failure N18.3 Chronic kidney disease, stage 3 (moderate) Facility Procedures CPT4 Code: 66063016 Description: 01093 - WOUND CARE VISIT-LEV 2 EST PT Modifier: Quantity: 1 Physician Procedures CPT4 Code: 2355732 Description: 99213 - WC PHYS LEVEL 3 - EST PT ICD-10 Diagnosis Description E11.622 Type 2 diabetes mellitus with other skin ulcer I89.0 Lymphedema, not elsewhere classified L97.222 Non-pressure chronic ulcer of left calf with fat layer expo L97.212  Non-pressure chronic ulcer of right calf with fat layer exp Modifier: sed osed Quantity: 1 Electronic Signature(s) Signed: 08/09/2017 9:36:23 AM By: Elliot Gurney, BSN, RN, CWS, Kim RN, BSN Signed: 08/09/2017 10:42:09 AM By: Lenda Kelp PA-C Previous Signature: 08/08/2017 5:42:41 PM Version By: Lenda Kelp PA-C Entered By: Elliot Gurney, BSN, RN, CWS,  Kim on 08/09/2017 09:36:22

## 2017-08-09 NOTE — Progress Notes (Signed)
[name] [name], [name] [name] ([id number]) Visit Report for 08/08/2017 Arrival Information Details Patient Name: [name] [name], [name] [name] Date of Service: 08/08/2017 11:00 AM Medical Record Number: [id number] Patient Account Number: [id number] Date of Birth/Sex: [birth year] (84 y.o. Female) Treating RN: Huel CoventryWoody, Kim Primary Care Chantel Teti: Barbette ReichmannHande, Vishwanath Other Clinician: Referring Tinzlee Craker: Barbette ReichmannHande, Vishwanath Treating Barbie Croston/Extender: Linwood DibblesSTONE III, HOYT Weeks in Treatment: 11 Visit Information History Since Last Visit Added or deleted any medications: No Patient Arrived: Cane Any new allergies or adverse reactions: No Arrival Time: 11:29 Had a fall or experienced change in No Accompanied By: daughter activities of daily living that may affect Transfer Assistance: None risk of falls: Patient Identification Verified: Yes Signs or symptoms of abuse/neglect since last visito No Secondary Verification Process Yes Hospitalized since last visit: No Completed: Pain Present Now: No Patient Requires Transmission-Based No Precautions: Patient Has Alerts: Yes Patient Alerts: Patient on Blood Thinner DMII warfarin Electronic Signature(s) Signed: 08/09/2017 8:36:08 AM By: Elliot GurneyWoody, BSN, RN, CWS, Kim RN, BSN Entered By: Elliot GurneyWoody, BSN, RN, CWS, Kim on 08/08/2017 11:30:34 [name] [name], [name] [name] ([id number]) -------------------------------------------------------------------------------- Clinic Level of Care Assessment Details Patient Name: [name] [name], [name] Date of Service: 08/08/2017 11:00 AM Medical Record Number: [id number] Patient Account Number: [id number] Date of Birth/Sex: [birth year] (84 y.o. Female) Treating RN: Huel CoventryWoody, Kim Primary Care Justinn Welter: Barbette ReichmannHande, Vishwanath Other Clinician: Referring Christyne Mccain: Barbette ReichmannHande, Vishwanath Treating Gail Creekmore/Extender: Linwood DibblesSTONE III, HOYT Weeks in Treatment: 11 Clinic Level of Care Assessment Items TOOL 4 Quantity Score []  - Use when only an EandM is performed on FOLLOW-UP visit 0 ASSESSMENTS -  Nursing Assessment / Reassessment []  - Reassessment of Co-morbidities (includes updates in patient status) 0 X- 1 5 Reassessment of Adherence to Treatment Plan ASSESSMENTS - Wound and Skin Assessment / Reassessment X - Simple Wound Assessment / Reassessment - one wound 1 5 []  - 0 Complex Wound Assessment / Reassessment - multiple wounds []  - 0 Dermatologic / Skin Assessment (not related to wound area) ASSESSMENTS - Focused Assessment []  - Circumferential Edema Measurements - multi extremities 0 []  - 0 Nutritional Assessment / Counseling / Intervention []  - 0 Lower Extremity Assessment (monofilament, tuning fork, pulses) []  - 0 Peripheral Arterial Disease Assessment (using hand held doppler) ASSESSMENTS - Ostomy and/or Continence Assessment and Care []  - Incontinence Assessment and Management 0 []  - 0 Ostomy Care Assessment and Management (repouching, etc.) PROCESS - Coordination of Care X - Simple Patient / Family Education for ongoing care 1 15 []  - 0 Complex (extensive) Patient / Family Education for ongoing care []  - 0 Staff obtains ChiropractorConsents, Records, Test Results / Process Orders []  - 0 Staff telephones HHA, Nursing Homes / Clarify orders / etc []  - 0 Routine Transfer to another Facility (non-emergent condition) []  - 0 Routine Hospital Admission (non-emergent condition) []  - 0 New Admissions / Manufacturing engineernsurance Authorizations / Ordering NPWT, Apligraf, etc. []  - 0 Emergency Hospital Admission (emergent condition) X- 1 10 Simple Discharge Coordination [name] [name], [name] [name] ([id number]) []  - 0 Complex (extensive) Discharge Coordination PROCESS - Special Needs []  - Pediatric / Minor Patient Management 0 []  - 0 Isolation Patient Management []  - 0 Hearing / Language / Visual special needs []  - 0 Assessment of Community assistance (transportation, D/C planning, etc.) []  - 0 Additional assistance / Altered mentation []  - 0 Support Surface(s) Assessment (bed, cushion, seat,  etc.) INTERVENTIONS - Wound Cleansing / Measurement X - Simple Wound Cleansing - one wound 1 5 []  - 0 Complex Wound Cleansing - multiple wounds X- 1 5 Wound Imaging (photographs - any number of wounds) []  -  0 Wound Tracing (instead of photographs) X- 1 5 Simple Wound Measurement - one wound []  - 0 Complex Wound Measurement - multiple wounds INTERVENTIONS - Wound Dressings []  - Small Wound Dressing one or multiple wounds 0 X- 1 15 Medium Wound Dressing one or multiple wounds []  - 0 Large Wound Dressing one or multiple wounds []  - 0 Application of Medications - topical []  - 0 Application of Medications - injection INTERVENTIONS - Miscellaneous []  - External ear exam 0 []  - 0 Specimen Collection (cultures, biopsies, blood, body fluids, etc.) []  - 0 Specimen(s) / Culture(s) sent or taken to Lab for analysis []  - 0 Patient Transfer (multiple staff / Nurse, adult / Similar devices) []  - 0 Simple Staple / Suture removal (25 or less) []  - 0 Complex Staple / Suture removal (26 or more) []  - 0 Hypo / Hyperglycemic Management (close monitor of Blood Glucose) []  - 0 Ankle / Brachial Index (ABI) - do not check if billed separately X- 1 5 Vital Signs [name] [name], [name] [name] ([id number]) Has the patient been seen at the hospital within the last three years: Yes Total Score: 70 Level Of Care: New/Established - Level 2 Electronic Signature(s) Unsigned Entered By: Elliot Gurney, BSN, RN, CWS, Kim on 08/09/2017 09:36:13 Signature(s): Date(s): [name] [name] ([id number]) -------------------------------------------------------------------------------- Encounter Discharge Information Details Patient Name: [name] [name], [name] [name] Date of Service: 08/08/2017 11:00 AM Medical Record Number: [id number] Patient Account Number: [id number] Date of Birth/Sex: Jan 04, 1926 (84 y.o. Female) Treating RN: Huel Coventry Primary Care Scotti Kosta: Barbette Reichmann Other Clinician: Referring Sekai Nayak: Barbette Reichmann Treating  Valari Taylor/Extender: Linwood Dibbles, HOYT Weeks in Treatment: 11 Encounter Discharge Information Items Schedule Follow-up Appointment: No Medication Reconciliation completed and No provided to Patient/Care Classie Weng: Provided on Clinical Summary of Care: 08/08/2017 Form Type Recipient Paper Patient MS Electronic Signature(s) Signed: 08/08/2017 4:58:43 PM By: Gwenlyn Perking Entered By: Gwenlyn Perking on 08/08/2017 12:00:04 [name] [name] ([id number]) -------------------------------------------------------------------------------- Lower Extremity Assessment Details Patient Name: [name] [name] Date of Service: 08/08/2017 11:00 AM Medical Record Number: [id number] Patient Account Number: [id number] Date of Birth/Sex: 1925/08/24 (84 y.o. Female) Treating RN: Huel Coventry Primary Care Micheline Markes: Barbette Reichmann Other Clinician: Referring Brandolyn Shortridge: Barbette Reichmann Treating Lura Falor/Extender: Linwood Dibbles, HOYT Weeks in Treatment: 11 Edema Assessment Assessed: [Left: No] [Right: No] [Left: Edema] [Right: :] Calf Left: Right: Point of Measurement: 34 cm From Medial Instep cm 43.5 cm Ankle Left: Right: Point of Measurement: 11 cm From Medial Instep cm 24 cm Vascular Assessment Claudication: Claudication Assessment [Right:None] Pulses: Dorsalis Pedis Palpable: [Right:Yes] Posterior Tibial Extremity colors, hair growth, and conditions: Extremity Color: [Right:Red] Hair Growth on Extremity: [Right:No] Temperature of Extremity: [Right:Cool] Capillary Refill: [Right:< 3 seconds] Toe Nail Assessment Left: Right: Thick: Yes Discolored: Yes Deformed: Yes Improper Length and Hygiene: Yes Electronic Signature(s) Signed: 08/09/2017 8:36:08 AM By: Elliot Gurney, BSN, RN, CWS, Kim RN, BSN Entered By: Elliot Gurney, BSN, RN, CWS, Kim on 08/08/2017 11:40:59 [name] [name] ([id number]) -------------------------------------------------------------------------------- Multi Wound Chart Details Patient Name:  [name] [name] Date of Service: 08/08/2017 11:00 AM Medical Record Number: [id number] Patient Account Number: [id number] Date of Birth/Sex: 1926/03/23 (84 y.o. Female) Treating RN: Huel Coventry Primary Care Skyelyn Scruggs: Barbette Reichmann Other Clinician: Referring Farra Nikolic: Barbette Reichmann Treating Kyden Potash/Extender: Linwood Dibbles, HOYT Weeks in Treatment: 11 Vital Signs Height(in): 63 Pulse(bpm): 67 Weight(lbs): 212 Blood Pressure(mmHg): 108/51 Body Mass Index(BMI): 38 Temperature(F): 98.1 Respiratory Rate 16 (breaths/min): Photos: [N/A:N/A] Wound Location: Right Lower Leg - Lateral N/A N/A Wounding Event: Blister N/A N/A Primary Etiology: Diabetic Wound/Ulcer of the N/A N/A Lower Extremity Secondary Etiology: Lymphedema  N/A N/A Comorbid History: Cataracts, Anemia, N/A N/A Arrhythmia, Congestive Heart Failure, Hypertension, Type II Diabetes Date Acquired: 07/08/2017 N/A N/A Weeks of Treatment: 4 N/A N/A Wound Status: Open N/A N/A Measurements L x W x D 0.5x0.3x0.1 N/A N/A (cm) Area (cm) : 0.118 N/A N/A Volume (cm) : 0.012 N/A N/A % Reduction in Area: 58.30% N/A N/A % Reduction in Volume: 57.10% N/A N/A Classification: Grade 1 N/A N/A Exudate Amount: Medium N/A N/A Exudate Type: Serous N/A N/A Exudate Color: amber N/A N/A Wound Margin: Flat and Intact N/A N/A Granulation Amount: Large (67-100%) N/A N/A Granulation Quality: Pink N/A N/A Necrotic Amount: None Present (0%) N/A N/A Exposed Structures: Fat Layer (Subcutaneous N/A N/A Tissue) Exposed: Yes Fascia: No Tendon: No Muscle: No [name] [name], [name] [name] ([id number]) Joint: No Bone: No Epithelialization: Medium (34-66%) N/A N/A Periwound Skin Texture: Excoriation: No N/A N/A Induration: No Callus: No Crepitus: No Rash: No Scarring: No Periwound Skin Moisture: Dry/Scaly: Yes N/A N/A Maceration: No Periwound Skin Color: Atrophie Blanche: No N/A N/A Cyanosis: No Ecchymosis: No Erythema: No Hemosiderin  Staining: No Mottled: No Pallor: No Rubor: No Temperature: No Abnormality N/A N/A Tenderness on Palpation: Yes N/A N/A Wound Preparation: Ulcer Cleansing: N/A N/A Rinsed/Irrigated with Saline Topical Anesthetic Applied: Other: lidocaine 4% Treatment Notes Electronic Signature(s) Signed: 08/09/2017 8:36:08 AM By: Elliot Gurney, BSN, RN, CWS, Kim RN, BSN Entered By: Elliot Gurney, BSN, RN, CWS, Kim on 08/08/2017 11:51:38 [name] [name] ([id number]) -------------------------------------------------------------------------------- Multi-Disciplinary Care Plan Details Patient Name: [name] [name] Date of Service: 08/08/2017 11:00 AM Medical Record Number: [id number] Patient Account Number: [id number] Date of Birth/Sex: 21-Jun-1926 (84 y.o. Female) Treating RN: Huel Coventry Primary Care Rommie Dunn: Barbette Reichmann Other Clinician: Referring Riddick Nuon: Barbette Reichmann Treating Manvir Thorson/Extender: Linwood Dibbles, HOYT Weeks in Treatment: 11 Active Inactive ` Abuse / Safety / Falls / Self Care Management Nursing Diagnoses: Impaired physical mobility Goals: Patient will not experience any injury related to falls Date Initiated: 05/19/2017 Target Resolution Date: 07/30/2017 Goal Status: Active Interventions: Assess fall risk on admission and as needed Notes: ` Orientation to the Wound Care Program Nursing Diagnoses: Knowledge deficit related to the wound healing center program Goals: Patient/caregiver will verbalize understanding of the Wound Healing Center Program Date Initiated: 05/19/2017 Target Resolution Date: 07/30/2017 Goal Status: Active Interventions: Provide education on orientation to the wound center Notes: ` Wound/Skin Impairment Nursing Diagnoses: Impaired tissue integrity Goals: Ulcer/skin breakdown will heal within 14 weeks Date Initiated: 05/19/2017 Target Resolution Date: 07/30/2017 Goal Status: Active Interventions: [name] [name], [name] [name] ([id number]) Assess patient/caregiver ability  to obtain necessary supplies Assess patient/caregiver ability to perform ulcer/skin care regimen upon admission and as needed Assess ulceration(s) every visit Notes: Electronic Signature(s) Signed: 08/09/2017 8:36:08 AM By: Elliot Gurney, BSN, RN, CWS, Kim RN, BSN Entered By: Elliot Gurney, BSN, RN, CWS, Kim on 08/08/2017 11:51:27 [name] [name] ([id number]) -------------------------------------------------------------------------------- Pain Assessment Details Patient Name: [name] [name] Date of Service: 08/08/2017 11:00 AM Medical Record Number: [id number] Patient Account Number: [id number] Date of Birth/Sex: 11-Dec-1925 (84 y.o. Female) Treating RN: Huel Coventry Primary Care Rasheedah Reis: Barbette Reichmann Other Clinician: Referring Niels Cranshaw: Barbette Reichmann Treating Olesya Wike/Extender: Linwood Dibbles, HOYT Weeks in Treatment: 11 Active Problems Location of Pain Severity and Description of Pain Patient Has Paino No Site Locations With Dressing Change: No Pain Management and Medication Current Pain Management: Goals for Pain Management Topical or injectable lidocaine is offered to patient for acute pain when surgical debridement is performed. If needed, Patient is instructed to use over the counter pain medication for the following 24-48 hours after debridement. Wound care  MDs do not prescribed pain medications. Patient has chronic pain or uncontrolled pain. Patient has been instructed to make an appointment with their Primary Care Physician for pain management. Electronic Signature(s) Signed: 08/09/2017 8:36:08 AM By: Elliot Gurney, BSN, RN, CWS, Kim RN, BSN Entered By: Elliot Gurney, BSN, RN, CWS, Kim on 08/08/2017 11:30:55 [name] [name] ([id number]) -------------------------------------------------------------------------------- Wound Assessment Details Patient Name: [name] [name] Date of Service: 08/08/2017 11:00 AM Medical Record Number: [id number] Patient Account Number: [id number] Date of Birth/Sex:  [birth year] (84 y.o. Female) Treating RN: Huel Coventry Primary Care Kalayla Shadden: Barbette Reichmann Other Clinician: Referring Javana Schey: Barbette Reichmann Treating Manami Tutor/Extender: Linwood Dibbles, HOYT Weeks in Treatment: 11 Wound Status Wound Number: 2 Primary Diabetic Wound/Ulcer of the Lower Extremity Etiology: Wound Location: Right Lower Leg - Lateral Secondary Lymphedema Wounding Event: Blister Etiology: Date Acquired: 07/08/2017 Wound Status: Open Weeks Of Treatment: 4 Comorbid Cataracts, Anemia, Arrhythmia, Congestive Clustered Wound: No History: Heart Failure, Hypertension, Type II Diabetes Photos Photo Uploaded By: Elliot Gurney, BSN, RN, CWS, Kim on 08/08/2017 11:44:21 Wound Measurements Length: (cm) 0.5 Width: (cm) 0.3 Depth: (cm) 0.1 Area: (cm) 0.118 Volume: (cm) 0.012 % Reduction in Area: 58.3% % Reduction in Volume: 57.1% Epithelialization: Medium (34-66%) Tunneling: No Undermining: No Wound Description Classification: Grade 1 Foul Od Wound Margin: Flat and Intact Slough/ Exudate Amount: Medium Exudate Type: Serous Exudate Color: amber or After Cleansing: No Fibrino No Wound Bed Granulation Amount: Large (67-100%) Exposed Structure Granulation Quality: Pink Fascia Exposed: No Necrotic Amount: None Present (0%) Fat Layer (Subcutaneous Tissue) Exposed: Yes Tendon Exposed: No Muscle Exposed: No Joint Exposed: No Bone Exposed: No Periwound Skin Texture Texture Color No Abnormalities Noted: No No Abnormalities Noted: No [name] [name], [name] [name] ([id number]) Callus: No Atrophie Blanche: No Crepitus: No Cyanosis: No Excoriation: No Ecchymosis: No Induration: No Erythema: No Rash: No Hemosiderin Staining: No Scarring: No Mottled: No Pallor: No Moisture Rubor: No No Abnormalities Noted: No Dry / Scaly: Yes Temperature / Pain Maceration: No Temperature: No Abnormality Tenderness on Palpation: Yes Wound Preparation Ulcer Cleansing: Rinsed/Irrigated with  Saline Topical Anesthetic Applied: Other: lidocaine 4%, Electronic Signature(s) Signed: 08/09/2017 8:36:08 AM By: Elliot Gurney, BSN, RN, CWS, Kim RN, BSN Entered By: Elliot Gurney, BSN, RN, CWS, Kim on 08/08/2017 11:38:15 [name] [name] ([id number]) -------------------------------------------------------------------------------- Vitals Details Patient Name: [name] [name] Date of Service: 08/08/2017 11:00 AM Medical Record Number: [id number] Patient Account Number: [id number] Date of Birth/Sex: 1926/04/23 (84 y.o. Female) Treating RN: Huel Coventry Primary Care Merideth Bosque: Barbette Reichmann Other Clinician: Referring Elisia Stepp: Barbette Reichmann Treating Carey Johndrow/Extender: Linwood Dibbles, HOYT Weeks in Treatment: 11 Vital Signs Time Taken: 11:30 Temperature (F): 98.1 Height (in): 63 Pulse (bpm): 67 Weight (lbs): 212 Respiratory Rate (breaths/min): 16 Body Mass Index (BMI): 37.6 Blood Pressure (mmHg): 108/51 Reference Range: 80 - 120 mg / dl Pulse Oximetry (%): 93 Notes When patient arrived into room she was short of breath and weezing. Daughter states patient usually arrived in a wheel chair but patient wanted to walk today. Initial O2 sats were 83-85%. After patient settled down from waking O2 sats increased to 95- 96%. Electronic Signature(s) Signed: 08/09/2017 8:36:08 AM By: Elliot Gurney, BSN, RN, CWS, Kim RN, BSN Entered By: Elliot Gurney, BSN, RN, CWS, Kim on 08/08/2017 11:33:27

## 2017-08-15 ENCOUNTER — Encounter: Payer: Medicare Other | Admitting: Physician Assistant

## 2017-08-15 DIAGNOSIS — E11622 Type 2 diabetes mellitus with other skin ulcer: Secondary | ICD-10-CM | POA: Diagnosis not present

## 2017-08-16 NOTE — Progress Notes (Signed)
[name] [name], Chairty ([id number]) Visit Report for 08/15/2017 Chief Complaint Document Details Patient Name: [name] [name], [name] [name] Date of Service: 08/15/2017 11:00 AM Medical Record Number: [id number] Patient Account Number: [id number] Date of Birth/Sex: 08/11/1925 (84 y.o. Female) Treating RN: Renne CriglerFlinchum, Cheryl Primary Care Provider: Barbette ReichmannHande, Vishwanath Other Clinician: Referring Provider: Barbette ReichmannHande, Vishwanath Treating Provider/Extender: Linwood DibblesSTONE III, Mei Suits Weeks in Treatment: 12 Information Obtained from: Patient Chief Complaint Patient presents to the wound care center for a consult due non healing wound to the left lower extremity with bilateral massive swelling of the legs Electronic Signature(s) Signed: 08/15/2017 4:51:58 PM By: Lenda KelpStone III, Dalal Livengood PA-C Entered By: Lenda KelpStone III, [place] Current on 08/15/2017 11:09:25 [name] [name], [name] [name] ([id number]) -------------------------------------------------------------------------------- HPI Details Patient Name: [name] [name], Imani Date of Service: 08/15/2017 11:00 AM Medical Record Number: [id number] Patient Account Number: [id number] Date of Birth/Sex: 08/11/1925 (84 y.o. Female) Treating RN: Renne CriglerFlinchum, Cheryl Primary Care Provider: Barbette ReichmannHande, Vishwanath Other Clinician: Referring Provider: Barbette ReichmannHande, Vishwanath Treating Provider/Extender: Linwood DibblesSTONE III, Blimie Vaness Weeks in Treatment: 12 History of Present Illness HPI Description: 84 year old patient seen by her PCP and referred to our center for left lower extremity stasis ulceration. She is known to have diabetes mellitus type 2, hypertension,status post cholecystectomy and open heart surgery, and atrial fibrillation and has bilateral lower extremity lymphedema with oozing of fluid. Her most recent hemoglobin A1c was 7.2. after assessment by the PCP she was put on torsemide 20 mg daily, echo was noted to have an ejection fraction of more than 55% with moderate MR, and the patient was maintained on warfarin for atrial fibrillation. the patient has  [place] in [place]  [place] about a year but has been traveling a bit and has not had any arterial or venous studies done recently. Her last echo was about a year and a half ago. 05/27/2017 -- she has not had any dressing changes since the last time she was here as a home health did not turn up. Her arterial duplex study is scheduled for next week. 06/03/2017 -- -- had a lower extremity arterial study done and there was arterial wall calcification and the resting ABI was noncompressible bilaterally. The right and left toe brachial indices were abnormal with the left first toe pressure being 76 mmHg and the right toe pressure was 89 mmHg. The right toe brachial index was 0.55 and the left toe brachial index was 0.47. 07/04/17 on evaluation today patient appears to be doing well in regard to her left lower chimney ulcer. She has been tolerating the current treatment plan without any complication. Unfortunately they did get compression hose delivered that were 30-40 mmHg but unfortunately she is unable to wear these. Her daughter was able to find 18 mmHg compression socks at the drugstore which she has been able to tolerate without any complication. This seems like it may be a better option for her. It's not as much compression as I would like but also it's something she's actually able to wear and something is going to be better than nothing. 07/11/17 on evaluation today patient appears to be doing fairly well regard to her left lower extremity ulcer. Unfortunately she does have a new blister on the right lower extremity which has opened at this point. Fortunately there does not appear to be any evidence of infection which is good news. With that being said one of the bigger issues today is that she seems to be having quite a bit of difficulty breathing she has a lot of audible rhonchi even before I listen to her with the stethoscope.  No fevers, chills, nausea, or vomiting noted at this time. She  does seem to be feeling very poorly according to her daughter she's also not walking and she normally would presumably this is due to the fact that she is short of breath. 07/25/17 patient appears to be showing signs of improvement in regard to left lower extremity ulcer. She has been tolerating the dressing changes without complication. She was placed on Keflex 500 mg three times a day due to cellulitis after evaluation by home health around Wednesday of last week. She is still taking [place]. Other than that her blood pressure was somewhat low today at 100/42 this was taken manually. I did recommend that she we take this at home with her blood pressure cuff as this is something that her daughter states is normally not that low. With that being said if it remains low they may need to contact patient's primary care provider. 08/08/17 evaluation today patient's right lateral loads from the ulcer appears to be doing fairly well at this point. There's no evidence of infection at this time. Fortunately he seems to be tolerating very well the Prisma at this point it does not seem to be overlapping the wound bed stock which is definitely good news. Overall I'm very pleased with the progress that she has made up to this point. Today's measurements will not currently smaller than last week but they also did not appear to be worse. 08/15/17 on evaluation today patient appears to be doing excellent in regard to her right lower extremity ulcer. In fact this appears really to be completely resolved. There is a very small area of dry skin which I do not believe is truly open anymore but I was not able to remove this at least not without potentially injuring her leg. I did not obviously want to open anything that [name] [name], [name] [name] ([id number]) was not open itself and therefore avoided getting too aggressive with this area. Nonetheless she had no pain with what I was doing and I do really feel this is completely healed.  Fortunately there is no evidence of infection at this time. Again she has no pain. Electronic Signature(s) Signed: 08/15/2017 4:51:58 PM By: Lenda Kelp PA-C Entered By: Lenda Kelp on 08/15/2017 11:10:35 [name] [name] ([id number]) -------------------------------------------------------------------------------- Physical Exam Details Patient Name: [name] [name] Date of Service: 08/15/2017 11:00 AM Medical Record Number: [id number] Patient Account Number: [id number] Date of Birth/Sex: 05-17-1926 (84 y.o. Female) Treating RN: Renne Crigler Primary Care Provider: Barbette Reichmann Other Clinician: Referring Provider: Barbette Reichmann Treating Provider/Extender: STONE III, Gehrig Patras Weeks in Treatment: 12 Constitutional Well-nourished and well-hydrated in no acute distress. Respiratory normal breathing without difficulty. clear to auscultation bilaterally. Cardiovascular regular rate and rhythm with normal S1, S2. Psychiatric this patient is able to make decisions and demonstrates good insight into disease process. Alert and Oriented x 3. pleasant and cooperative. Notes At this point patient has just a very small dry patch at the site where the wound was on evaluation today. There does not appear to be any erythema or infection surrounding there's no fluid underneath. Electronic Signature(s) Signed: 08/15/2017 4:51:58 PM By: Lenda Kelp PA-C Entered By: Lenda Kelp on 08/15/2017 11:11:26 [name] [name] ([id number]) -------------------------------------------------------------------------------- Physician Orders Details Patient Name: [name] [name] Date of Service: 08/15/2017 11:00 AM Medical Record Number: [id number] Patient Account Number: [id number] Date of Birth/Sex: 10/13/1925 (84 y.o. Female) Treating RN: Renne Crigler Primary Care Provider: Barbette Reichmann Other Clinician: Referring Provider: Barbette Reichmann Treating Provider/Extender: Linwood Dibbles,  Thaddaeus Granja Weeks in Treatment: 12 Verbal / Phone Orders: No Diagnosis Coding Wound Cleansing Wound #2 Right,Lateral Lower Leg o Clean wound with Normal Saline. Anesthetic (add to Medication List) Wound #2 Right,Lateral Lower Leg o Topical Lidocaine 4% cream applied to wound bed prior to debridement (In Clinic Only). Secondary Dressing Wound #2 Right,Lateral Lower Leg o Non-adherent pad - secure lightly with coban - DO NOT WRAP COBAN TIGHTLY Dressing Change Frequency Wound #2 Right,Lateral Lower Leg o Change dressing every other day. Follow-up Appointments Wound #2 Right,Lateral Lower Leg o Return Appointment in 1 week. Edema Control Wound #2 Right,Lateral Lower Leg o Patient to wear own compression stockings o Elevate legs to the level of the heart and pump ankles as often as possible Additional Orders / Instructions Wound #2 Right,Lateral Lower Leg o Increase protein intake. o Other: - Please try to keep blood sugars below 180. Please add over the counter vitamin C, multivitamin and zinc supplements to your diet. Home Health Wound #2 Right,Lateral Lower Leg o Continue Home Health Visits - [facility] o Home Health Nurse may visit PRN to address patientos wound care needs. o FACE TO FACE ENCOUNTER: MEDICARE and MEDICAID PATIENTS: I certify that this patient is under my care and that I had a face-to-face encounter that meets the physician face-to-face encounter requirements with this patient on this date. The encounter with the patient was in whole or in part for the following MEDICAL CONDITION: (primary reason for Home Healthcare) MEDICAL NECESSITY: I certify, that based on my findings, NURSING services are a medically necessary home health service. HOME BOUND STATUS: I certify that my clinical findings support that this patient is homebound (i.e., Due to illness or injury, pt requires aid of supportive devices such as crutches, cane, wheelchairs, walkers, the use  of special transportation or the [place], [place] ([id number]) assistance of another person to leave their place of residence. There is a normal inability to leave the home and doing so requires considerable and taxing effort. Other absences are for medical reasons / religious services and are infrequent or of short duration when for other reasons). o If current dressing causes regression in wound condition, may D/C ordered dressing product/s and apply Normal Saline Moist Dressing daily until next Wound Healing Center / Other MD appointment. Notify Wound Healing Center of regression in wound condition at [phone]. o Please direct any NON-WOUND related issues/requests for orders to patient's Primary Care Physician Electronic Signature(s) Signed: 08/15/2017 4:14:37 PM By: Renne Crigler Signed: 08/15/2017 4:51:58 PM By: Lenda Kelp PA-C Entered By: Renne Crigler on 08/15/2017 11:06:05 [name] [name] ([id number]) -------------------------------------------------------------------------------- Problem List Details Patient Name: [name] [name] Date of Service: 08/15/2017 11:00 AM Medical Record Number: [id number] Patient Account Number: [id number] Date of Birth/Sex: [birth year] (84 y.o. Female) Treating RN: Renne Crigler Primary Care Provider: Barbette Reichmann Other Clinician: Referring Provider: Barbette Reichmann Treating Provider/Extender: Linwood Dibbles, Khole Arterburn Weeks in Treatment: 12 Active Problems ICD-10 Encounter Code Description Active Date Diagnosis E11.622 Type 2 diabetes mellitus with other skin ulcer 05/19/2017 Yes I89.0 Lymphedema, not elsewhere classified 05/19/2017 Yes L97.222 Non-pressure chronic ulcer of left calf with fat layer exposed 05/19/2017 Yes L97.212 Non-pressure chronic ulcer of right calf with fat layer exposed 07/11/2017 Yes I50.22 Chronic systolic (congestive) heart failure 05/19/2017 Yes N18.3 Chronic kidney disease, stage 3 (moderate) 05/19/2017  Yes Inactive Problems Resolved Problems Electronic Signature(s) Signed: 08/15/2017 4:51:58 PM By: Lenda Kelp PA-C Entered By: Lenda Kelp on 08/15/2017 11:09:05 [name] [name], [name] [name] ([id number]) -------------------------------------------------------------------------------- Progress Note Details Patient Name:  [name] [name] Date of Service: 08/15/2017 11:00 AM Medical Record Number: [id number] Patient Account Number: [id number] Date of Birth/Sex: 04-21-1926 (84 y.o. Female) Treating RN: Renne Crigler Primary Care Provider: Barbette Reichmann Other Clinician: Referring Provider: Barbette Reichmann Treating Provider/Extender: Linwood Dibbles, Aldrin Engelhard Weeks in Treatment: 12 Subjective Chief Complaint Information obtained from Patient Patient presents to the wound care center for a consult due non healing wound to the left lower extremity with bilateral massive swelling of the legs History of Present Illness (HPI) 84 year old patient seen by her PCP and referred to our center for left lower extremity stasis ulceration. She is known to have diabetes mellitus type 2, hypertension,status post cholecystectomy and open heart surgery, and atrial fibrillation and has bilateral lower extremity lymphedema with oozing of fluid. Her most recent hemoglobin A1c was 7.2. after assessment by the PCP she was put on torsemide 20 mg daily, echo was noted to have an ejection fraction of more than 55% with moderate MR, and the patient was maintained on warfarin for atrial fibrillation. the patient has been living in [place] for about a year but has been traveling a bit and has not had any arterial or venous studies done recently. Her last echo was about a year and a half ago. 05/27/2017 -- she has not had any dressing changes since the last time she was here as a home health did not turn up. Her arterial duplex study is scheduled for next week. 06/03/2017 -- -- had a lower extremity arterial study done  and there was arterial wall calcification and the resting ABI was noncompressible bilaterally. The right and left toe brachial indices were abnormal with the left first toe pressure being 76 mmHg and the right toe pressure was 89 mmHg. The right toe brachial index was 0.55 and the left toe brachial index was 0.47. 07/04/17 on evaluation today patient appears to be doing well in regard to her left lower chimney ulcer. She has been tolerating the current treatment plan without any complication. Unfortunately they did get compression hose delivered that were 30-40 mmHg but unfortunately she is unable to wear these. Her daughter was able to find 18 mmHg compression socks at the drugstore which she has been able to tolerate without any complication. This seems like it may be a better option for her. It's not as much compression as I would like but also it's something she's actually able to wear and something is going to be better than nothing. 07/11/17 on evaluation today patient appears to be doing fairly well regard to her left lower extremity ulcer. Unfortunately she does have a new blister on the right lower extremity which has opened at this point. Fortunately there does not appear to be any evidence of infection which is good news. With that being said one of the bigger issues today is that she seems to be having quite a bit of difficulty breathing she has a lot of audible rhonchi even before I listen to her with the stethoscope. No fevers, chills, nausea, or vomiting noted at this time. She does seem to be feeling very poorly according to her daughter she's also not walking and she normally would presumably this is due to the fact that she is short of breath. 07/25/17 patient appears to be showing signs of improvement in regard to left lower extremity ulcer. She has been tolerating the dressing changes without complication. She was placed on Keflex 500 mg three times a day due to cellulitis after  evaluation by home  health around Wednesday of last week. She is still taking [place]. Other than that her blood pressure was somewhat low today at 100/42 this was taken manually. I did recommend that she we take this at home with her blood pressure cuff as this is something that her daughter states is normally not that low. With that being said if it remains low they may need to contact patient's primary care provider. 08/08/17 evaluation today patient's right lateral loads from the ulcer appears to be doing fairly well at this point. There's no evidence of infection at this time. Fortunately he seems to be tolerating very well the Prisma at this point it does not seem to [name] [name], [name] [name] ([id number]) be overlapping the wound bed stock which is definitely good news. Overall I'm very pleased with the progress that she has made up to this point. Today's measurements will not currently smaller than last week but they also did not appear to be worse. 08/15/17 on evaluation today patient appears to be doing excellent in regard to her right lower extremity ulcer. In fact this appears really to be completely resolved. There is a very small area of dry skin which I do not believe is truly open anymore but I was not able to remove this at least not without potentially injuring her leg. I did not obviously want to open anything that was not open itself and therefore avoided getting too aggressive with this area. Nonetheless she had no pain with what I was doing and I do really feel this is completely healed. Fortunately there is no evidence of infection at this time. Again she has no pain. Patient History Information obtained from Patient. Family History Cancer - Child, Heart Disease - Mother, No family history of Diabetes, Hereditary Spherocytosis, Hypertension, Kidney Disease, Lung Disease, Seizures, Stroke, Thyroid Problems, Tuberculosis. Social History Former smoker - quit 60 years ago, Marital Status -  Widowed, Alcohol Use - Never, Drug Use - No History, Caffeine Use - Daily. Medical And Surgical History Notes Eyes macular degeneration Review of Systems (ROS) Constitutional Symptoms (General Health) Denies complaints or symptoms of Fever, Chills. Respiratory The patient has no complaints or symptoms. Cardiovascular Complains or has symptoms of LE edema. Psychiatric The patient has no complaints or symptoms. Objective Constitutional Well-nourished and well-hydrated in no acute distress. Vitals Time Taken: 10:48 AM, Height: 63 in, Weight: 212 lbs, BMI: 37.6, Temperature: 97.7 F, Pulse: 63 bpm, Respiratory Rate: 20 breaths/min, Blood Pressure: 125/50 mmHg. Respiratory normal breathing without difficulty. clear to auscultation bilaterally. Cardiovascular Corprew, Sundi ([id number]) regular rate and rhythm with normal S1, S2. Psychiatric this patient is able to make decisions and demonstrates good insight into disease process. Alert and Oriented x 3. pleasant and cooperative. General Notes: At this point patient has just a very small dry patch at the site where the wound was on evaluation today. There does not appear to be any erythema or infection surrounding there's no fluid underneath. Integumentary (Hair, Skin) Wound #2 status is Open. Original cause of wound was Blister. The wound is located on the Right,Lateral Lower Leg. The wound measures 0.2cm length x 0.2cm width x 0.1cm depth; 0.031cm^2 area and 0.003cm^3 volume. There is Fat Layer (Subcutaneous Tissue) Exposed exposed. There is no tunneling or undermining noted. There is a none present amount of drainage noted. The wound margin is flat and intact. There is no granulation within the wound bed. There is a large (67-100%) amount of necrotic tissue within the wound bed including Eschar. The periwound  skin appearance exhibited: Dry/Scaly. The periwound skin appearance did not exhibit: Callus, Crepitus, Excoriation,  Induration, Rash, Scarring, Maceration, Atrophie Blanche, Cyanosis, Ecchymosis, Hemosiderin Staining, Mottled, Pallor, Rubor, Erythema. Periwound temperature was noted as No Abnormality. The periwound has tenderness on palpation. Assessment Active Problems ICD-10 E11.622 - Type 2 diabetes mellitus with other skin ulcer I89.0 - Lymphedema, not elsewhere classified L97.222 - Non-pressure chronic ulcer of left calf with fat layer exposed L97.212 - Non-pressure chronic ulcer of right calf with fat layer exposed I50.22 - Chronic systolic (congestive) heart failure N18.3 - Chronic kidney disease, stage 3 (moderate) Plan Wound Cleansing: Wound #2 Right,Lateral Lower Leg: Clean wound with Normal Saline. Anesthetic (add to Medication List): Wound #2 Right,Lateral Lower Leg: Topical Lidocaine 4% cream applied to wound bed prior to debridement (In Clinic Only). Secondary Dressing: Wound #2 Right,Lateral Lower Leg: Non-adherent pad - secure lightly with coban - DO NOT WRAP COBAN TIGHTLY Dressing Change Frequency: Wound #2 Right,Lateral Lower Leg: Change dressing every other day. Follow-up Appointments: Wound #2 Right,Lateral Lower Leg: Return Appointment in 1 week. Edema Control: [name] [name], [name] [name] ([id number]) Wound #2 Right,Lateral Lower Leg: Patient to wear own compression stockings Elevate legs to the level of the heart and pump ankles as often as possible Additional Orders / Instructions: Wound #2 Right,Lateral Lower Leg: Increase protein intake. Other: - Please try to keep blood sugars below 180. Please add over the counter vitamin C, multivitamin and zinc supplements to your diet. Home Health: Wound #2 Right,Lateral Lower Leg: Continue Home Health Visits - [facility] Nurse may visit PRN to address patient s wound care needs. FACE TO FACE ENCOUNTER: MEDICARE and MEDICAID PATIENTS: I certify that this patient is under my care and that I had a face-to-face encounter that  meets the physician face-to-face encounter requirements with this patient on this date. The encounter with the patient was in whole or in part for the following MEDICAL CONDITION: (primary reason for Home Healthcare) MEDICAL NECESSITY: I certify, that based on my findings, NURSING services are a medically necessary home health service. HOME BOUND STATUS: I certify that my clinical findings support that this patient is homebound (i.e., Due to illness or injury, pt requires aid of supportive devices such as crutches, cane, wheelchairs, walkers, the use of special transportation or the assistance of another person to leave their place of residence. There is a normal inability to leave the home and doing so requires considerable and taxing effort. Other absences are for medical reasons / religious services and are infrequent or of short duration when for other reasons). If current dressing causes regression in wound condition, may D/C ordered dressing product/s and apply Normal Saline Moist Dressing daily until next Wound Healing Center / Other MD appointment. Notify Wound Healing Center of regression in wound condition at [phone]. Please direct any NON-WOUND related issues/requests for orders to patient's Primary Care Physician I am going to recommend that we continue to protect this with a telfa over the next week. With that being said I do think that if everything still remains close when home health comes out on Friday that they can contact our office we will cancel the appointment for next week and go ahead and heal her out. In fact I'm hopeful that that is indeed what will occur. If she has any concerns however we will see her on Monday for the regularly scheduled appointment. Otherwise it was a pleasure caring for this patient and I'm hopeful that I do not have to see her  on Monday as this will mean good news for her. Please see above for specific wound care orders. We will see patient for  re-evaluation in 1 week(s) here in the clinic. If anything worsens or changes patient will contact our office for additional recommendations. Electronic Signature(s) Signed: 08/15/2017 4:51:58 PM By: Lenda Kelp PA-C Entered By: Lenda Kelp on 08/15/2017 11:12:44 [name] [name] ([id number]) -------------------------------------------------------------------------------- ROS/PFSH Details Patient Name: [name] [name] Date of Service: 08/15/2017 11:00 AM Medical Record Number: [id number] Patient Account Number: [id number] Date of Birth/Sex: 04-23-1926 (84 y.o. Female) Treating RN: Renne Crigler Primary Care Provider: Barbette Reichmann Other Clinician: Referring Provider: Barbette Reichmann Treating Provider/Extender: Linwood Dibbles, Tajon Moring Weeks in Treatment: 12 Information Obtained From Patient Wound History Do you currently have one or more open woundso Yes How many open wounds do you currently haveo 2 Approximately how long have you had your woundso 5 months How have you been treating your wound(s) until nowo camphil Has your wound(s) ever healed and then re-openedo No Have you had any lab work done in the past montho No Have you tested positive for an antibiotic resistant organism (MRSA, VRE)o No Have you tested positive for osteomyelitis (bone infection)o No Have you had any tests for circulation on your legso No Have you had other problems associated with your woundso Swelling Constitutional Symptoms (General Health) Complaints and Symptoms: Negative for: Fever; Chills Cardiovascular Complaints and Symptoms: Positive for: LE edema Medical History: Positive for: Arrhythmia - a fib; Congestive Heart Failure; Hypertension Negative for: Angina; Coronary Artery Disease; Deep Vein Thrombosis; Hypotension; Myocardial Infarction; Peripheral Arterial Disease; Peripheral Venous Disease; Phlebitis; Vasculitis Eyes Medical History: Positive for: Cataracts - removed Past Medical  History Notes: macular degeneration Hematologic/Lymphatic Medical History: Positive for: Anemia Negative for: Hemophilia; Human Immunodeficiency Virus; Lymphedema; Sickle Cell Disease Respiratory Complaints and Symptoms: No Complaints or Symptoms Medical History: Negative for: Aspiration; Asthma; Chronic Obstructive Pulmonary Disease (COPD); Pneumothorax; Sleep Apnea; MONTGOMERY, ROTHLISBERGER ([id number]) Tuberculosis Gastrointestinal Medical History: Negative for: Cirrhosis ; Colitis; Crohnos; Hepatitis A; Hepatitis B; Hepatitis C Endocrine Medical History: Positive for: Type II Diabetes Treated with: Insulin Immunological Medical History: Negative for: Lupus Erythematosus; Raynaudos; Scleroderma Musculoskeletal Medical History: Negative for: Gout; Rheumatoid Arthritis; Osteoarthritis; Osteomyelitis Neurologic Medical History: Negative for: Dementia; Neuropathy Oncologic Medical History: Negative for: Received Chemotherapy; Received Radiation Psychiatric Complaints and Symptoms: No Complaints or Symptoms HBO Extended History Items Eyes: Cataracts Immunizations Pneumococcal Vaccine: Received Pneumococcal Vaccination: Yes Immunization Notes: up to date Implantable Devices Family and Social History Cancer: Yes - Child; Diabetes: No; Heart Disease: Yes - Mother; Hereditary Spherocytosis: No; Hypertension: No; Kidney Disease: No; Lung Disease: No; Seizures: No; Stroke: No; Thyroid Problems: No; Tuberculosis: No; Former smoker - quit 60 years ago; Marital Status - Widowed; Alcohol Use: Never; Drug Use: No History; Caffeine Use: Daily; Financial Concerns: No; Food, Clothing or Shelter Needs: No; Support System Lacking: No; Transportation Concerns: No; Advanced Directives: No; Patient does not want information on Advanced Directives Physician EMERALD, SHOR ([id number]) I have reviewed and agree with the above information. Electronic Signature(s) Signed: 08/15/2017  4:14:37 PM By: Renne Crigler Signed: 08/15/2017 4:51:58 PM By: Lenda Kelp PA-C Entered By: Lenda Kelp on 08/15/2017 11:11:09 [name] [name] ([id number]) -------------------------------------------------------------------------------- SuperBill Details Patient Name: [name] [name] Date of Service: 08/15/2017 Medical Record Number: [id number] Patient Account Number: [id number] Date of Birth/Sex: 15-Dec-1925 (84 y.o. Female) Treating RN: Renne Crigler Primary Care Provider: Barbette Reichmann Other Clinician: Referring Provider: Barbette Reichmann Treating Provider/Extender: STONE III, Johnnetta Holstine Weeks in Treatment:  12 Diagnosis Coding ICD-10 Codes Code Description E11.622 Type 2 diabetes mellitus with other skin ulcer I89.0 Lymphedema, not elsewhere classified L97.222 Non-pressure chronic ulcer of left calf with fat layer exposed L97.212 Non-pressure chronic ulcer of right calf with fat layer exposed I50.22 Chronic systolic (congestive) heart failure N18.3 Chronic kidney disease, stage 3 (moderate) Facility Procedures CPT4 Code: 16109604 Description: [id number] - WOUND CARE VISIT-LEV 2 EST PT Modifier: Quantity: 1 Physician Procedures CPT4 Code: 1191478 Description: 99213 - WC PHYS LEVEL 3 - EST PT ICD-10 Diagnosis Description E11.622 Type 2 diabetes mellitus with other skin ulcer I89.0 Lymphedema, not elsewhere classified L97.222 Non-pressure chronic ulcer of left calf with fat layer expo L97.212  Non-pressure chronic ulcer of right calf with fat layer exp Modifier: sed osed Quantity: 1 Electronic Signature(s) Signed: 08/15/2017 4:51:58 PM By: Lenda Kelp PA-C Entered By: Lenda Kelp on 08/15/2017 11:13:04

## 2017-08-18 NOTE — Progress Notes (Signed)
ALAJA, GOLDINGER ([id number]) Visit Report for 08/15/2017 Arrival Information Details Patient Name: [name] [name], [name] [name] Date of Service: 08/15/2017 11:00 AM Medical Record Number: [id number] Patient Account Number: [id number] Date of Birth/Sex: November 20, 1925 (84 y.o. Female) Treating RN: Ashok Cordia, Debi Primary Care Lonny Eisen: Barbette Reichmann Other Clinician: Referring Dontavian Marchi: Barbette Reichmann Treating Hiro Vipond/Extender: Linwood Dibbles, HOYT Weeks in Treatment: 12 Visit Information History Since Last Visit All ordered tests and consults were completed: No Patient Arrived: Wheel Chair Added or deleted any medications: No Arrival Time: 10:46 Any new allergies or adverse reactions: No Accompanied By: daughter Had a fall or experienced change in No Transfer Assistance: EasyPivot Patient activities of daily living that may affect Lift risk of falls: Patient Identification Verified: Yes Signs or symptoms of abuse/neglect since last visito No Secondary Verification Process Yes Hospitalized since last visit: No Completed: Has Dressing in Place as Prescribed: Yes Patient Requires Transmission-Based No Precautions: Pain Present Now: No Patient Has Alerts: Yes Patient Alerts: Patient on Blood Thinner DMII warfarin Electronic Signature(s) Signed: 08/17/2017 4:30:35 PM By: Alejandro Mulling Entered By: Alejandro Mulling on 08/15/2017 10:48:25 [name] [name] ([id number]) -------------------------------------------------------------------------------- Clinic Level of Care Assessment Details Patient Name: [name] [name] Date of Service: 08/15/2017 11:00 AM Medical Record Number: [id number] Patient Account Number: [id number] Date of Birth/Sex: March 23, 1926 (84 y.o. Female) Treating RN: Renne Crigler Primary Care Britteny Fiebelkorn: Barbette Reichmann Other Clinician: Referring Mohsin Crum: Barbette Reichmann Treating Froilan Mclean/Extender: Linwood Dibbles, HOYT Weeks in Treatment: 12 Clinic Level of Care Assessment  Items TOOL 4 Quantity Score []  - Use when only an EandM is performed on FOLLOW-UP visit 0 ASSESSMENTS - Nursing Assessment / Reassessment []  - Reassessment of Co-morbidities (includes updates in patient status) 0 X- 1 5 Reassessment of Adherence to Treatment Plan ASSESSMENTS - Wound and Skin Assessment / Reassessment X - Simple Wound Assessment / Reassessment - one wound 1 5 []  - 0 Complex Wound Assessment / Reassessment - multiple wounds []  - 0 Dermatologic / Skin Assessment (not related to wound area) ASSESSMENTS - Focused Assessment []  - Circumferential Edema Measurements - multi extremities 0 []  - 0 Nutritional Assessment / Counseling / Intervention []  - 0 Lower Extremity Assessment (monofilament, tuning fork, pulses) []  - 0 Peripheral Arterial Disease Assessment (using hand held doppler) ASSESSMENTS - Ostomy and/or Continence Assessment and Care []  - Incontinence Assessment and Management 0 []  - 0 Ostomy Care Assessment and Management (repouching, etc.) PROCESS - Coordination of Care X - Simple Patient / Family Education for ongoing care 1 15 []  - 0 Complex (extensive) Patient / Family Education for ongoing care []  - 0 Staff obtains Chiropractor, Records, Test Results / Process Orders []  - 0 Staff telephones HHA, Nursing Homes / Clarify orders / etc []  - 0 Routine Transfer to another Facility (non-emergent condition) []  - 0 Routine Hospital Admission (non-emergent condition) []  - 0 New Admissions / Manufacturing engineer / Ordering NPWT, Apligraf, etc. []  - 0 Emergency Hospital Admission (emergent condition) X- 1 10 Simple Discharge Coordination [place], Joline ([id number]) []  - 0 Complex (extensive) Discharge Coordination PROCESS - Special Needs []  - Pediatric / Minor Patient Management 0 []  - 0 Isolation Patient Management []  - 0 Hearing / Language / Visual special needs []  - 0 Assessment of Community assistance (transportation, D/C planning, etc.) []  -  0 Additional assistance / Altered mentation []  - 0 Support Surface(s) Assessment (bed, cushion, seat, etc.) INTERVENTIONS - Wound Cleansing / Measurement X - Simple Wound Cleansing - one wound 1 5 []  - 0 Complex Wound Cleansing - multiple wounds X- 1  5 Wound Imaging (photographs - any number of wounds) []  - 0 Wound Tracing (instead of photographs) X- 1 5 Simple Wound Measurement - one wound []  - 0 Complex Wound Measurement - multiple wounds INTERVENTIONS - Wound Dressings X - Small Wound Dressing one or multiple wounds 1 10 []  - 0 Medium Wound Dressing one or multiple wounds []  - 0 Large Wound Dressing one or multiple wounds []  - 0 Application of Medications - topical []  - 0 Application of Medications - injection INTERVENTIONS - Miscellaneous []  - External ear exam 0 []  - 0 Specimen Collection (cultures, biopsies, blood, body fluids, etc.) []  - 0 Specimen(s) / Culture(s) sent or taken to Lab for analysis []  - 0 Patient Transfer (multiple staff / Nurse, adult / Similar devices) []  - 0 Simple Staple / Suture removal (25 or less) []  - 0 Complex Staple / Suture removal (26 or more) []  - 0 Hypo / Hyperglycemic Management (close monitor of Blood Glucose) []  - 0 Ankle / Brachial Index (ABI) - do not check if billed separately X- 1 5 Vital Signs Helbig, Zoila ([id number]) Has the patient been seen at the hospital within the last three years: Yes Total Score: 65 Level Of Care: New/Established - Level 2 Electronic Signature(s) Signed: 08/15/2017 4:14:37 PM By: Renne Crigler Entered By: Renne Crigler on 08/15/2017 11:06:33 [name] [name] ([id number]) -------------------------------------------------------------------------------- Encounter Discharge Information Details Patient Name: [name] [name] Date of Service: 08/15/2017 11:00 AM Medical Record Number: [id number] Patient Account Number: [id number] Date of Birth/Sex: May 17, 1926 (84 y.o. Female) Treating RN:  Renne Crigler Primary Care Drayke Grabel: Barbette Reichmann Other Clinician: Referring Merced Hanners: Barbette Reichmann Treating Adain Geurin/Extender: Linwood Dibbles, HOYT Weeks in Treatment: 12 Encounter Discharge Information Items Discharge Pain Level: 0 Discharge Condition: Stable Ambulatory Status: Wheelchair Discharge Destination: Home Transportation: Private Auto Accompanied By: dtr Schedule Follow-up Appointment: Yes Medication Reconciliation completed and No provided to Patient/Care Yonael Tulloch: Provided on Clinical Summary of Care: 08/15/2017 Form Type Recipient Paper Patient MS Electronic Signature(s) Signed: 08/15/2017 12:36:41 PM By: Curtis Sites Entered By: Curtis Sites on 08/15/2017 12:36:41 [name] [name] ([id number]) -------------------------------------------------------------------------------- Lower Extremity Assessment Details Patient Name: [name] [name] Date of Service: 08/15/2017 11:00 AM Medical Record Number: [id number] Patient Account Number: [id number] Date of Birth/Sex: [birth year] (84 y.o. Female) Treating RN: Ashok Cordia, Debi Primary Care Keirra Zeimet: Barbette Reichmann Other Clinician: Referring Estel Scholze: Barbette Reichmann Treating Geordan Xu/Extender: Linwood Dibbles, HOYT Weeks in Treatment: 12 Edema Assessment Assessed: [Left: No] [Right: No] [Left: Edema] [Right: :] Calf Left: Right: Point of Measurement: 34 cm From Medial Instep cm 43 cm Ankle Left: Right: Point of Measurement: 11 cm From Medial Instep cm 24 cm Vascular Assessment Pulses: Dorsalis Pedis Palpable: [Right:Yes] Posterior Tibial Extremity colors, hair growth, and conditions: Extremity Color: [Right:Hyperpigmented] Temperature of Extremity: [Right:Cool] Capillary Refill: [Right:< 3 seconds] Toe Nail Assessment Left: Right: Thick: No Discolored: No Deformed: Yes Improper Length and Hygiene: Yes Electronic Signature(s) Signed: 08/17/2017 4:30:35 PM By: Alejandro Mulling Entered By:  Alejandro Mulling on 08/15/2017 10:55:46 [name] [name] ([id number]) -------------------------------------------------------------------------------- Multi Wound Chart Details Patient Name: [name] [name] Date of Service: 08/15/2017 11:00 AM Medical Record Number: [id number] Patient Account Number: [id number] Date of Birth/Sex: 11-25-1925 (84 y.o. Female) Treating RN: Renne Crigler Primary Care Catrina Fellenz: Barbette Reichmann Other Clinician: Referring Taytem Ghattas: Barbette Reichmann Treating Jailan Trimm/Extender: Linwood Dibbles, HOYT Weeks in Treatment: 12 Vital Signs Height(in): 63 Pulse(bpm): 63 Weight(lbs): 212 Blood Pressure(mmHg): 125/50 Body Mass Index(BMI): 38 Temperature(F): 97.7 Respiratory Rate 20 (breaths/min): Photos: [2:No Photos] [N/A:N/A] Wound Location: [2:Right Lower Leg - Lateral] [N/A:N/A] Wounding Event: [  2:Blister] [N/A:N/A] Primary Etiology: [2:Diabetic Wound/Ulcer of the Lower Extremity] [N/A:N/A] Secondary Etiology: [2:Lymphedema] [N/A:N/A] Comorbid History: [2:Cataracts, Anemia, Arrhythmia, Congestive Heart Failure, Hypertension, Type II Diabetes] [N/A:N/A] Date Acquired: [2:07/08/2017] [N/A:N/A] Weeks of Treatment: [2:5] [N/A:N/A] Wound Status: [2:Open] [N/A:N/A] Measurements L x W x D [2:0.2x0.2x0.1] [N/A:N/A] (cm) Area (cm) : [2:0.031] [N/A:N/A] Volume (cm) : [2:0.003] [N/A:N/A] % Reduction in Area: [2:89.00%] [N/A:N/A] % Reduction in Volume: [2:89.30%] [N/A:N/A] Classification: [2:Grade 1] [N/A:N/A] Exudate Amount: [2:None Present] [N/A:N/A] Wound Margin: [2:Flat and Intact] [N/A:N/A] Granulation Amount: [2:None Present (0%)] [N/A:N/A] Necrotic Amount: [2:Large (67-100%)] [N/A:N/A] Necrotic Tissue: [2:Eschar] [N/A:N/A] Exposed Structures: [2:Fat Layer (Subcutaneous Tissue) Exposed: Yes Fascia: No Tendon: No Muscle: No Joint: No Bone: No] [N/A:N/A] Epithelialization: [2:Medium (34-66%)] [N/A:N/A] Periwound Skin Texture: [2:Excoriation: No  Induration: No Callus: No Crepitus: No] [N/A:N/A] Rash: No Scarring: No Periwound Skin Moisture: Dry/Scaly: Yes N/A N/A Maceration: No Periwound Skin Color: Atrophie Blanche: No N/A N/A Cyanosis: No Ecchymosis: No Erythema: No Hemosiderin Staining: No Mottled: No Pallor: No Rubor: No Temperature: No Abnormality N/A N/A Tenderness on Palpation: Yes N/A N/A Wound Preparation: Ulcer Cleansing: N/A N/A Rinsed/Irrigated with Saline Topical Anesthetic Applied: Other: lidocaine 4% Treatment Notes Electronic Signature(s) Signed: 08/15/2017 4:14:37 PM By: Renne CriglerFlinchum, Cheryl Entered By: Renne CriglerFlinchum, Cheryl on 08/15/2017 11:02:07 [name] BudSIEGEL, Saima ([id number]) -------------------------------------------------------------------------------- Multi-Disciplinary Care Plan Details Patient Name: [name] BudSIEGEL, Mildreth Date of Service: 08/15/2017 11:00 AM Medical Record Number: [id number] Patient Account Number: [id number] Date of Birth/Sex: 09-10-1925 (84 y.o. Female) Treating RN: Renne CriglerFlinchum, Cheryl Primary Care Roxanne Orner: Barbette ReichmannHande, Vishwanath Other Clinician: Referring Calaya Gildner: Barbette ReichmannHande, Vishwanath Treating Kimani Bedoya/Extender: Linwood DibblesSTONE III, HOYT Weeks in Treatment: 12 Active Inactive ` Abuse / Safety / Falls / Self Care Management Nursing Diagnoses: Impaired physical mobility Goals: Patient will not experience any injury related to falls Date Initiated: 05/19/2017 Target Resolution Date: 07/30/2017 Goal Status: Active Interventions: Assess fall risk on admission and as needed Notes: ` Orientation to the Wound Care Program Nursing Diagnoses: Knowledge deficit related to the wound healing center program Goals: Patient/caregiver will verbalize understanding of the Wound Healing Center Program Date Initiated: 05/19/2017 Target Resolution Date: 07/30/2017 Goal Status: Active Interventions: Provide education on orientation to the wound center Notes: ` Wound/Skin Impairment Nursing Diagnoses: Impaired  tissue integrity Goals: Ulcer/skin breakdown will heal within 14 weeks Date Initiated: 05/19/2017 Target Resolution Date: 07/30/2017 Goal Status: Active Interventions: [name] BudSIEGEL, Naya ([id number]) Assess patient/caregiver ability to obtain necessary supplies Assess patient/caregiver ability to perform ulcer/skin care regimen upon admission and as needed Assess ulceration(s) every visit Notes: Electronic Signature(s) Signed: 08/15/2017 4:14:37 PM By: Renne CriglerFlinchum, Cheryl Entered By: Renne CriglerFlinchum, Cheryl on 08/15/2017 11:01:57 [name] BudSIEGEL, Tessie ([id number]) -------------------------------------------------------------------------------- Pain Assessment Details Patient Name: [name] BudSIEGEL, Danaye Date of Service: 08/15/2017 11:00 AM Medical Record Number: [id number] Patient Account Number: [id number] Date of Birth/Sex: 09-10-1925 (84 y.o. Female) Treating RN: Ashok CordiaPinkerton, Debi Primary Care Marzell Allemand: Barbette ReichmannHande, Vishwanath Other Clinician: Referring Shelbylynn Walczyk: Barbette ReichmannHande, Vishwanath Treating Cedarius Kersh/Extender: Linwood DibblesSTONE III, HOYT Weeks in Treatment: 12 Active Problems Location of Pain Severity and Description of Pain Patient Has Paino No Site Locations Pain Management and Medication Current Pain Management: Electronic Signature(s) Signed: 08/17/2017 4:30:35 PM By: Alejandro MullingPinkerton, Debra Entered By: Alejandro MullingPinkerton, Debra on 08/15/2017 10:48:31 [name] BudSIEGEL, Corayma ([id number]) -------------------------------------------------------------------------------- Patient/Caregiver Education Details Patient Name: [name] BudSIEGEL, Keni Date of Service: 08/15/2017 11:00 AM Medical Record Number: [id number] Patient Account Number: [id number] Date of Birth/Gender: 09-10-1925 (84 y.o. Female) Treating RN: Curtis Sitesorthy, Joanna Primary Care Physician: Barbette ReichmannHande, Vishwanath Other Clinician: Referring Physician: Barbette ReichmannHande, Vishwanath Treating Physician/Extender: Skeet SimmerSTONE III, HOYT Weeks in Treatment: 12 Education Assessment Education Provided To: Caregiver  Education  Topics Provided Venous: Handouts: Other: continue using pumps daily Methods: Explain/Verbal Responses: State content correctly Electronic Signature(s) Signed: 08/15/2017 4:35:46 PM By: Curtis Sites Entered By: Curtis Sites on 08/15/2017 12:37:02 [name] [name] ([id number]) -------------------------------------------------------------------------------- Wound Assessment Details Patient Name: [name] [name] Date of Service: 08/15/2017 11:00 AM Medical Record Number: [id number] Patient Account Number: [id number] Date of Birth/Sex: [birth year] (84 y.o. Female) Treating RN: Ashok Cordia, Debi Primary Care Jaivyn Gulla: Barbette Reichmann Other Clinician: Referring Naeema Patlan: Barbette Reichmann Treating Navy Rothschild/Extender: Linwood Dibbles, HOYT Weeks in Treatment: 12 Wound Status Wound Number: 2 Primary Diabetic Wound/Ulcer of the Lower Extremity Etiology: Wound Location: Right Lower Leg - Lateral Secondary Lymphedema Wounding Event: Blister Etiology: Date Acquired: 07/08/2017 Wound Status: Open Weeks Of Treatment: 5 Comorbid Cataracts, Anemia, Arrhythmia, Congestive Clustered Wound: No History: Heart Failure, Hypertension, Type II Diabetes Photos Photo Uploaded By: Renne Crigler on 08/15/2017 16:13:27 Wound Measurements Length: (cm) 0.2 Width: (cm) 0.2 Depth: (cm) 0.1 Area: (cm) 0.031 Volume: (cm) 0.003 % Reduction in Area: 89% % Reduction in Volume: 89.3% Epithelialization: Medium (34-66%) Tunneling: No Undermining: No Wound Description Classification: Grade 1 Wound Margin: Flat and Intact Exudate Amount: None Present Foul Odor After Cleansing: No Slough/Fibrino No Wound Bed Granulation Amount: None Present (0%) Exposed Structure Necrotic Amount: Large (67-100%) Fascia Exposed: No Necrotic Quality: Eschar Fat Layer (Subcutaneous Tissue) Exposed: Yes Tendon Exposed: No Muscle Exposed: No Joint Exposed: No Bone Exposed: No Periwound Skin Texture Texture Color No  Abnormalities Noted: No No Abnormalities Noted: No Gist, Briele ([id number]) Callus: No Atrophie Blanche: No Crepitus: No Cyanosis: No Excoriation: No Ecchymosis: No Induration: No Erythema: No Rash: No Hemosiderin Staining: No Scarring: No Mottled: No Pallor: No Moisture Rubor: No No Abnormalities Noted: No Dry / Scaly: Yes Temperature / Pain Maceration: No Temperature: No Abnormality Tenderness on Palpation: Yes Wound Preparation Ulcer Cleansing: Rinsed/Irrigated with Saline Topical Anesthetic Applied: Other: lidocaine 4%, Treatment Notes Wound #2 (Right, Lateral Lower Leg) 1. Cleansed with: Clean wound with Normal Saline 2. Anesthetic Topical Lidocaine 4% cream to wound bed prior to debridement 4. Dressing Applied: Non-Adherent gauze 5. Secondary Dressing Applied Kerlix/Conform Notes coban lightly to secure Electronic Signature(s) Signed: 08/17/2017 4:30:35 PM By: Alejandro Mulling Entered By: Alejandro Mulling on 08/15/2017 10:58:27 CLORENE, NERIO ([id number]) -------------------------------------------------------------------------------- Vitals Details Patient Name: [name] [name] Date of Service: 08/15/2017 11:00 AM Medical Record Number: [id number] Patient Account Number: [id number] Date of Birth/Sex: 1926/05/16 (84 y.o. Female) Treating RN: Ashok Cordia, Debi Primary Care Antoni Stefan: Barbette Reichmann Other Clinician: Referring Temitayo Covalt: Barbette Reichmann Treating Ram Haugan/Extender: Linwood Dibbles, HOYT Weeks in Treatment: 12 Vital Signs Time Taken: 10:48 Temperature (F): 97.7 Height (in): 63 Pulse (bpm): 63 Weight (lbs): 212 Respiratory Rate (breaths/min): 20 Body Mass Index (BMI): 37.6 Blood Pressure (mmHg): 125/50 Reference Range: 80 - 120 mg / dl Electronic Signature(s) Signed: 08/17/2017 4:30:35 PM By: Alejandro Mulling Entered By: Alejandro Mulling on 08/15/2017 10:50:36

## 2017-08-22 ENCOUNTER — Ambulatory Visit: Payer: Medicare Other | Admitting: Physician Assistant

## 2017-08-23 ENCOUNTER — Other Ambulatory Visit: Payer: Self-pay

## 2017-08-23 ENCOUNTER — Emergency Department: Payer: Medicare Other

## 2017-08-23 ENCOUNTER — Emergency Department
Admission: EM | Admit: 2017-08-23 | Discharge: 2017-08-23 | Disposition: A | Discharge status: Home / Self Care | Payer: Medicare Other | Attending: Emergency Medicine | Admitting: Emergency Medicine

## 2017-08-23 DIAGNOSIS — Z794 Long term (current) use of insulin: Secondary | ICD-10-CM | POA: Insufficient documentation

## 2017-08-23 DIAGNOSIS — Z951 Presence of aortocoronary bypass graft: Secondary | ICD-10-CM | POA: Diagnosis not present

## 2017-08-23 DIAGNOSIS — E1122 Type 2 diabetes mellitus with diabetic chronic kidney disease: Secondary | ICD-10-CM | POA: Diagnosis not present

## 2017-08-23 DIAGNOSIS — I129 Hypertensive chronic kidney disease with stage 1 through stage 4 chronic kidney disease, or unspecified chronic kidney disease: Secondary | ICD-10-CM | POA: Diagnosis not present

## 2017-08-23 DIAGNOSIS — J40 Bronchitis, not specified as acute or chronic: Secondary | ICD-10-CM | POA: Insufficient documentation

## 2017-08-23 DIAGNOSIS — Z7901 Long term (current) use of anticoagulants: Secondary | ICD-10-CM | POA: Insufficient documentation

## 2017-08-23 DIAGNOSIS — Z79899 Other long term (current) drug therapy: Secondary | ICD-10-CM | POA: Insufficient documentation

## 2017-08-23 DIAGNOSIS — N183 Chronic kidney disease, stage 3 (moderate): Secondary | ICD-10-CM | POA: Diagnosis not present

## 2017-08-23 DIAGNOSIS — R0602 Shortness of breath: Secondary | ICD-10-CM | POA: Diagnosis present

## 2017-08-23 LAB — COMPREHENSIVE METABOLIC PANEL
ALBUMIN: 3.8 g/dL (ref 3.5–5.0)
ALT: 21 U/L (ref 14–54)
ANION GAP: 10 (ref 5–15)
AST: 37 U/L (ref 15–41)
Alkaline Phosphatase: 135 U/L — ABNORMAL HIGH (ref 38–126)
BUN: 42 mg/dL — ABNORMAL HIGH (ref 6–20)
CO2: 29 mmol/L (ref 22–32)
Calcium: 8.8 mg/dL — ABNORMAL LOW (ref 8.9–10.3)
Chloride: 100 mmol/L — ABNORMAL LOW (ref 101–111)
Creatinine, Ser: 1.89 mg/dL — ABNORMAL HIGH (ref 0.44–1.00)
GFR calc non Af Amer: 22 mL/min — ABNORMAL LOW (ref >60)
GFR, EST AFRICAN AMERICAN: 26 mL/min — AB (ref >60)
Glucose, Bld: 198 mg/dL — ABNORMAL HIGH (ref 65–99)
Potassium: 4.2 mmol/L (ref 3.5–5.1)
SODIUM: 139 mmol/L (ref 135–145)
Total Bilirubin: 1 mg/dL (ref 0.3–1.2)
Total Protein: 7.7 g/dL (ref 6.5–8.1)

## 2017-08-23 LAB — CBC WITH DIFFERENTIAL/PLATELET
Basophils Absolute: 0 10*3/uL (ref 0–0.1)
Basophils Relative: 0 %
Eosinophils Absolute: 0.1 10*3/uL (ref 0–0.7)
Eosinophils Relative: 2 %
HEMATOCRIT: 39.1 % (ref 35.0–47.0)
HEMOGLOBIN: 12.4 g/dL (ref 12.0–16.0)
LYMPHS PCT: 35 %
Lymphs Abs: 2.9 10*3/uL (ref 1.0–3.6)
MCH: 28.8 pg (ref 26.0–34.0)
MCHC: 31.7 g/dL — AB (ref 32.0–36.0)
MCV: 90.9 fL (ref 80.0–100.0)
MONOS PCT: 10 %
Monocytes Absolute: 0.8 10*3/uL (ref 0.2–0.9)
NEUTROS ABS: 4.4 10*3/uL (ref 1.4–6.5)
NEUTROS PCT: 53 %
Platelets: 130 10*3/uL — ABNORMAL LOW (ref 150–440)
RBC: 4.31 MIL/uL (ref 3.80–5.20)
RDW: 20.4 % — ABNORMAL HIGH (ref 11.5–14.5)
WBC: 8.3 10*3/uL (ref 3.6–11.0)

## 2017-08-23 LAB — TROPONIN I
TROPONIN I: 0.03 ng/mL — AB (ref <0.03)
Troponin I: 0.03 ng/mL (ref <0.03)

## 2017-08-23 MED ORDER — IPRATROPIUM-ALBUTEROL 0.5-2.5 (3) MG/3ML IN SOLN
RESPIRATORY_TRACT | Status: AC
  Filled 2017-08-23: qty 6

## 2017-08-23 MED ORDER — ASPIRIN 81 MG PO CHEW
324.0000 mg | CHEWABLE_TABLET | Freq: Once | ORAL | Status: AC
  Administered 2017-08-23: 324 mg via ORAL

## 2017-08-23 MED ORDER — ALBUTEROL SULFATE HFA 108 (90 BASE) MCG/ACT IN AERS: 2.0000 | INHALATION_SPRAY | Freq: Four times a day (QID) | RESPIRATORY_TRACT | 2 refills | Status: AC | PRN

## 2017-08-23 MED ORDER — IPRATROPIUM-ALBUTEROL 0.5-2.5 (3) MG/3ML IN SOLN
6.0000 mL | Freq: Once | RESPIRATORY_TRACT | Status: AC
  Administered 2017-08-23: 6 mL via RESPIRATORY_TRACT

## 2017-08-23 MED ORDER — IPRATROPIUM-ALBUTEROL 0.5-2.5 (3) MG/3ML IN SOLN: 3.0000 mL | Freq: Once | RESPIRATORY_TRACT | Status: DC

## 2017-08-23 MED ORDER — ASPIRIN 81 MG PO CHEW
CHEWABLE_TABLET | ORAL | Status: AC
  Filled 2017-08-23: qty 4

## 2017-08-23 NOTE — Discharge Instructions (Signed)
please follow-up with your regular doctor in the next couple days. Please return for further chest tightness. Please use the albuterol inhaler and spacer - 2 puffs 4 times a day or up to every 4 hours if need be. if you need to use more than 2 puffs every 4 hours please return here. also return for increasing shortness of breath fever or feeling sicker. also please call Dr. Juliann Paresallwood the cardiologist. Have him check on you tomorrow or the next day just to be sure.

## 2017-08-23 NOTE — ED Notes (Signed)
Dr. Darnelle CatalanMalinda gave pt something to eat.

## 2017-08-23 NOTE — ED Notes (Signed)
Date and time results received: 08/23/17 1615   Test: troponin Critical Value: 0.03  Name of Provider Notified: Dr. Darnelle CatalanMalinda

## 2017-08-23 NOTE — ED Triage Notes (Signed)
Pt arrives to ED from home via [facility] for SOB. Pt does NOT wear oxygen at home. Was 85% RA with EMS. Placed on nasal cannula and came up to mid 90's. Received albuterol and solumedrol enroute. Arrives tachypenic and wheezing. Reports swelling in extremities past few days. Reports no pain upon arrival. Hx a fib, HTN, DM. NKA. Alert and oriented. Appears to have trouble speaking in complete sentences. 88% RA in ED. Arrives 20 L hand.

## 2017-08-23 NOTE — ED Provider Notes (Signed)
[facility] Emergency Department Provider Note   ____________________________________________   First MD Initiated Contact with Patient 08/23/17 1134     (approximate)  I have reviewed the triage vital signs and the nursing notes.   HISTORY  Chief Complaint Shortness of Breath    HPI [name] [name] is a 84 y.o. female Patient reports she's had increasing shortness of breath for last 2 days. She is not running a fever. She is not coughing.  a little tight she says sHe does have some wheezing when she breathes. She is also had intermittent chest tightness in the middle of her chest. Worse with exertion. Does not feel like the chest pain she was having before she had her cardiac catheterization.just feelsa little tight she says. She has also had more swelling in her legs in the last few days.O2 sats in the ER 88% on room air. This is after the albuterol nebulizer and this site [facility] gave her.   Past Medical History:  Diagnosis Date  . A-fib (HCC)   . A-fib (HCC)   . Chronic kidney disease (CKD), stage III (moderate) (HCC)   . Diabetes mellitus without complication (HCC)   . Hypertension   . Thyroid disease     Patient Active Problem List   Diagnosis Date Noted  . Uncontrolled diabetes mellitus (HCC) 05/04/2016    Past Surgical History:  Procedure Laterality Date  . CARDIAC CATHETERIZATION    . CORONARY ARTERY BYPASS GRAFT      Prior to Admission medications   Medication Sig Start Date End Date Taking? Authorizing Provider  acetaminophen (TYLENOL) 650 MG CR tablet Take 650 mg by mouth every 8 (eight) hours as needed for pain.   Yes [provider]  Cyanocobalamin 1000 MCG CAPS Take 1 tablet by mouth daily.   Yes [provider]  gabapentin (NEURONTIN) 100 MG capsule Take 100 mg by mouth at bedtime.  12/08/16 12/08/17 Yes [provider]  insulin lispro (HUMALOG KWIKPEN) 100 UNIT/ML KiwkPen INJECT 28-36 UNITS DAILY  06/06/17  Yes [provider]  iron polysaccharides (NU-IRON) 150 MG capsule Take 150 mg by mouth daily.  12/08/16 12/08/17 Yes [provider]  isosorbide mononitrate (IMDUR) 30 MG 24 hr tablet Take 30 mg by mouth daily.  09/22/16 09/22/17 Yes [provider]  LANTUS SOLOSTAR 100 UNIT/ML Solostar Pen Inject 36 Units into the skin at bedtime. Patient taking differently: Inject 50 Units into the skin at bedtime.  05/06/16  Yes Wieting, Richard, MD  levothyroxine (SYNTHROID, LEVOTHROID) 25 MCG tablet Take 25 mcg by mouth daily before breakfast.  04/11/17 04/11/18 Yes [provider]  losartan (COZAAR) 25 MG tablet Take 1 tablet (25 mg total) by mouth daily. 05/06/16  Yes Wieting, Richard, MD  metoprolol (LOPRESSOR) 25 MG tablet Take 1 tablet (25 mg total) by mouth 2 (two) times daily. 05/06/16  Yes Wieting, Richard, MD  Multiple Vitamin (MULTIVITAMIN) capsule Take 1 capsule by mouth daily.   Yes [provider]  Multiple Vitamins-Minerals (OCUVITE EYE + MULTI) TABS Take 1 tablet by mouth daily.   Yes [provider]  potassium chloride SA (K-DUR,KLOR-CON) 20 MEQ tablet Take 20 mEq by mouth daily.    Yes [provider]  torsemide (DEMADEX) 20 MG tablet Take 20 mg by mouth daily.  03/17/17 03/17/18 Yes [provider]  warfarin (COUMADIN) 2.5 MG tablet Take 2.5 mg by mouth daily. 04/02/16  Yes [provider]  albuterol (PROVENTIL HFA;VENTOLIN HFA) 108 (90  Base) MCG/ACT inhaler Inhale 2 puffs into the lungs every 6 (six) hours as needed for wheezing or shortness of breath. please dispense with a spacer and instructed in use. 08/23/17   Arnaldo NatalMalinda, Paul F, MD  mupirocin ointment (BACTROBAN) 2 % Apply topically.    [provider]    Allergies Pineapple  Family History  Problem Relation Age of Onset  . Heart Problems Mother     Social History Social History   Tobacco Use  . Smoking status: Never Smoker  . Smokeless  tobacco: Never Used  Substance Use Topics  . Alcohol use: No  . Drug use: No    Review of Systems  Constitutional: No fever/chills Eyes: No visual changes. ENT: No sore throat. Cardiovascular:  chest pain. Respiratory:  shortness of breath. Gastrointestinal: No abdominal pain.  No nausea, no vomiting.  No diarrhea.  No constipation. Genitourinary: Negative for dysuria. Musculoskeletal: Negative for back pain. Skin: Negative for rash. Neurological: Negative for headaches, focal weakness ____________________________________________   PHYSICAL EXAM:  VITAL SIGNS: ED Triage Vitals [08/23/17 1129]  Enc Vitals Group     BP      Pulse Rate (!) 101     Resp (!) 27     Temp (!) 97.1 F (36.2 C)     Temp Source Oral     SpO2 93 %     Weight      Height 5\' 2"  (1.575 m)     Head Circumference      Peak Flow      Pain Score 0     Pain Loc      Pain Edu?      Excl. in GC?     Constitutional: Alert and oriented. Appearing short of breath. Eyes: Conjunctivae are normal.  Head: Atraumatic. Nose: No congestion/rhinnorhea. Mouth/Throat: Mucous membranes are moist.  Oropharynx non-erythematous. Neck: No stridor.  Cardiovascular: Normal rate, regular rhythm. Grossly normal heart sounds.  Good peripheral circulation. Respiratory: increased respiratory effort. retractions. Lungs wheezes and squeaks throughout. Gastrointestinal: Soft and nontender. No distention. No abdominal bruits. No CVA tenderness. Musculoskeletal: No lower extremity tenderness 1+ edema.  No joint effusions. Neurologic:  Normal speech and language. No gross focal neurologic deficits are appreciated. Skin:  Skin is warm, dry and intact. No rash noted. Psychiatric: Mood and affect are normal. Speech and behavior are normal.  ____________________________________________   LABS (all labs ordered are listed, but only abnormal results are displayed)  Labs Reviewed  TROPONIN I - Abnormal; Notable for the following  components:      Result Value   Troponin I 0.03 (*)    All other components within normal limits  COMPREHENSIVE METABOLIC PANEL - Abnormal; Notable for the following components:   Chloride 100 (*)    Glucose, Bld 198 (*)    BUN 42 (*)    Creatinine, Ser 1.89 (*)    Calcium 8.8 (*)    Alkaline Phosphatase 135 (*)    GFR calc non Af Amer 22 (*)    GFR calc Af Amer 26 (*)    All other components within normal limits  CBC WITH DIFFERENTIAL/PLATELET - Abnormal; Notable for the following components:   MCHC 31.7 (*)    RDW 20.4 (*)    Platelets 130 (*)    All other components within normal limits  TROPONIN I - Abnormal; Notable for the following components:   Troponin I 0.03 (*)    All other components within normal limits   ____________________________________________  EKG  .  EKG from December 17 patient has some slight ST segment downsloping in V4 5 and 6 which was not present before. Otherwise EKG is A. fib at a rate of 99 right axis ____________________________________________  RADIOLOGY  ED MD interpretation:  chest x-ray reviewed I do not disagree with the radiologist report that clinically she does not appear to haveany change in her baseline congestive failure  Official radiology report(s): Dg Chest Portable 1 View  Result Date: 08/23/2017 CLINICAL DATA:  Shortness of breath with wheezing. Lower extremity swelling. EXAM: PORTABLE CHEST 1 VIEW COMPARISON:  05/04/2016 and CT 05/04/2016 FINDINGS: Lungs are adequately inflated with minimal prominence of the perihilar markings. No focal lobar consolidation or effusion. Stable cardiomegaly. Stable bilateral prominence of the pulmonary arteries suggesting pulmonary arterial hypertension. Remainder the exam is unchanged. IMPRESSION: Stable cardiomegaly with suggestion mild vascular congestion. Stable prominence of the pulmonary arteries likely pulmonary arterial hypertension. Electronically Signed   By: Elberta Fortis M.D.   On:  08/23/2017 12:17    ____________________________________________   PROCEDURES  Procedure(s) performed:   Procedures  Critical Care performed:   ____________________________________________   INITIAL IMPRESSION / ASSESSMENT AND PLAN / ED COURSE  patient's troponins remain negative. Patient feels a lot better and her O2 sats go up to 94-95% on room air. Heart rate last I saw her was 101.patient feels a lot better. Patient did have return of some epigastric discomfort and thought maybe she was hungry. I gave her a sandwich and discomfort one away.        ____________________________________________   FINAL CLINICAL IMPRESSION(S) / ED DIAGNOSES  Final diagnoses:  Bronchitis     ED Discharge Orders        Ordered    albuterol (PROVENTIL HFA;VENTOLIN HFA) 108 (90 Base) MCG/ACT inhaler  Every 6 hours PRN     08/23/17 1528       Note:  This document was prepared using Dragon voice recognition software and may include unintentional dictation errors.    Arnaldo Natal, MD 08/23/17 2014

## 2017-08-23 NOTE — ED Notes (Signed)
X-ray at bedside

## 2017-08-23 NOTE — ED Notes (Signed)
Date and time results received: 08/23/17 1402   Test: troponin Critical Value: 0.03  Name of Provider Notified: Dr. Darnelle CatalanMalinda

## 2017-08-23 NOTE — ED Notes (Signed)
Dr. Darnelle CatalanMalinda wants to trial pt off oxygen at this time. Pt on room air. Will continue to monitor.

## 2018-02-19 DEATH — deceased
